# Patient Record
Sex: Female | Born: 1945 | Race: White | Hispanic: No | Marital: Married | Smoking: Never smoker
Health system: Southern US, Community
[De-identification: ages and names within clinical notes are randomized; demographics above are authoritative.]

## PROBLEM LIST (undated history)

## (undated) DIAGNOSIS — R0789 Other chest pain: Secondary | ICD-10-CM

## (undated) DIAGNOSIS — I10 Essential (primary) hypertension: Secondary | ICD-10-CM

## (undated) DIAGNOSIS — K589 Irritable bowel syndrome without diarrhea: Secondary | ICD-10-CM

## (undated) DIAGNOSIS — C439 Malignant melanoma of skin, unspecified: Secondary | ICD-10-CM

## (undated) DIAGNOSIS — R208 Other disturbances of skin sensation: Secondary | ICD-10-CM

## (undated) DIAGNOSIS — E538 Deficiency of other specified B group vitamins: Secondary | ICD-10-CM

## (undated) DIAGNOSIS — M47812 Spondylosis without myelopathy or radiculopathy, cervical region: Secondary | ICD-10-CM

## (undated) DIAGNOSIS — E7800 Pure hypercholesterolemia, unspecified: Secondary | ICD-10-CM

## (undated) DIAGNOSIS — F419 Anxiety disorder, unspecified: Secondary | ICD-10-CM

## (undated) DIAGNOSIS — K5730 Diverticulosis of large intestine without perforation or abscess without bleeding: Secondary | ICD-10-CM

## (undated) DIAGNOSIS — K219 Gastro-esophageal reflux disease without esophagitis: Secondary | ICD-10-CM

## (undated) DIAGNOSIS — H4310 Vitreous hemorrhage, unspecified eye: Secondary | ICD-10-CM

## (undated) HISTORY — DX: Irritable bowel syndrome, unspecified: K58.9

## (undated) HISTORY — DX: Diverticulosis of large intestine without perforation or abscess without bleeding: K57.30

## (undated) HISTORY — DX: Other disturbances of skin sensation: R20.8

## (undated) HISTORY — DX: Essential (primary) hypertension: I10

## (undated) HISTORY — DX: Vitreous hemorrhage, unspecified eye: H43.10

## (undated) HISTORY — DX: Other chest pain: R07.89

## (undated) HISTORY — DX: Deficiency of other specified B group vitamins: E53.8

## (undated) HISTORY — PX: OTHER SURGICAL HISTORY: SHX169

## (undated) HISTORY — DX: Malignant melanoma of skin, unspecified: C43.9

## (undated) HISTORY — DX: Gastro-esophageal reflux disease without esophagitis: K21.9

## (undated) HISTORY — DX: Anxiety disorder, unspecified: F41.9

## (undated) HISTORY — DX: Pure hypercholesterolemia, unspecified: E78.00

## (undated) HISTORY — DX: Spondylosis without myelopathy or radiculopathy, cervical region: M47.812

---

## 1993-02-27 HISTORY — PX: OTHER SURGICAL HISTORY: SHX169

## 1994-02-27 HISTORY — PX: OTHER SURGICAL HISTORY: SHX169

## 1997-06-25 ENCOUNTER — Ambulatory Visit (HOSPITAL_COMMUNITY): Admission: RE | Admit: 1997-06-25 | Discharge: 1997-06-25 | Payer: Self-pay | Admitting: Obstetrics & Gynecology

## 1997-11-11 ENCOUNTER — Other Ambulatory Visit: Admission: RE | Admit: 1997-11-11 | Discharge: 1997-11-11 | Payer: Self-pay | Admitting: Obstetrics & Gynecology

## 1998-04-08 ENCOUNTER — Ambulatory Visit (HOSPITAL_COMMUNITY): Admission: RE | Admit: 1998-04-08 | Discharge: 1998-04-08 | Payer: Self-pay | Admitting: Obstetrics & Gynecology

## 1998-04-08 ENCOUNTER — Encounter: Payer: Self-pay | Admitting: Obstetrics & Gynecology

## 1998-11-16 ENCOUNTER — Other Ambulatory Visit: Admission: RE | Admit: 1998-11-16 | Discharge: 1998-11-16 | Payer: Self-pay | Admitting: Obstetrics & Gynecology

## 1999-12-20 ENCOUNTER — Other Ambulatory Visit: Admission: RE | Admit: 1999-12-20 | Discharge: 1999-12-20 | Payer: Self-pay | Admitting: Obstetrics & Gynecology

## 2000-05-16 ENCOUNTER — Encounter: Payer: Self-pay | Admitting: Obstetrics & Gynecology

## 2000-05-16 ENCOUNTER — Encounter: Admission: RE | Admit: 2000-05-16 | Discharge: 2000-05-16 | Payer: Self-pay | Admitting: Obstetrics & Gynecology

## 2000-08-24 ENCOUNTER — Ambulatory Visit (HOSPITAL_BASED_OUTPATIENT_CLINIC_OR_DEPARTMENT_OTHER): Admission: RE | Admit: 2000-08-24 | Discharge: 2000-08-24 | Payer: Self-pay | Admitting: Plastic Surgery

## 2000-08-24 ENCOUNTER — Encounter (INDEPENDENT_AMBULATORY_CARE_PROVIDER_SITE_OTHER): Payer: Self-pay | Admitting: *Deleted

## 2000-09-24 ENCOUNTER — Ambulatory Visit (HOSPITAL_COMMUNITY): Admission: RE | Admit: 2000-09-24 | Discharge: 2000-09-24 | Payer: Self-pay | Admitting: Obstetrics & Gynecology

## 2000-09-24 ENCOUNTER — Encounter: Payer: Self-pay | Admitting: Obstetrics & Gynecology

## 2000-12-04 ENCOUNTER — Encounter: Payer: Self-pay | Admitting: Obstetrics & Gynecology

## 2000-12-04 ENCOUNTER — Encounter: Admission: RE | Admit: 2000-12-04 | Discharge: 2000-12-04 | Payer: Self-pay | Admitting: Obstetrics & Gynecology

## 2000-12-25 ENCOUNTER — Other Ambulatory Visit: Admission: RE | Admit: 2000-12-25 | Discharge: 2000-12-25 | Payer: Self-pay | Admitting: Obstetrics & Gynecology

## 2001-02-08 ENCOUNTER — Emergency Department (HOSPITAL_COMMUNITY): Admission: EM | Admit: 2001-02-08 | Discharge: 2001-02-08 | Payer: Self-pay | Admitting: Emergency Medicine

## 2001-02-08 ENCOUNTER — Encounter: Payer: Self-pay | Admitting: Emergency Medicine

## 2001-02-11 ENCOUNTER — Emergency Department (HOSPITAL_COMMUNITY): Admission: EM | Admit: 2001-02-11 | Discharge: 2001-02-11 | Payer: Self-pay | Admitting: Emergency Medicine

## 2001-02-11 ENCOUNTER — Encounter: Payer: Self-pay | Admitting: Emergency Medicine

## 2001-12-05 ENCOUNTER — Encounter: Admission: RE | Admit: 2001-12-05 | Discharge: 2001-12-05 | Payer: Self-pay | Admitting: Obstetrics & Gynecology

## 2001-12-05 ENCOUNTER — Encounter: Payer: Self-pay | Admitting: Obstetrics & Gynecology

## 2001-12-12 ENCOUNTER — Encounter: Payer: Self-pay | Admitting: Obstetrics & Gynecology

## 2001-12-12 ENCOUNTER — Ambulatory Visit (HOSPITAL_COMMUNITY): Admission: RE | Admit: 2001-12-12 | Discharge: 2001-12-12 | Payer: Self-pay | Admitting: Obstetrics & Gynecology

## 2001-12-25 ENCOUNTER — Other Ambulatory Visit: Admission: RE | Admit: 2001-12-25 | Discharge: 2001-12-25 | Payer: Self-pay | Admitting: Obstetrics & Gynecology

## 2002-01-22 ENCOUNTER — Ambulatory Visit (HOSPITAL_COMMUNITY): Admission: RE | Admit: 2002-01-22 | Discharge: 2002-01-22 | Payer: Self-pay | Admitting: Pulmonary Disease

## 2002-01-22 ENCOUNTER — Encounter: Payer: Self-pay | Admitting: Pulmonary Disease

## 2002-03-18 ENCOUNTER — Emergency Department (HOSPITAL_COMMUNITY): Admission: EM | Admit: 2002-03-18 | Discharge: 2002-03-19 | Payer: Self-pay | Admitting: Emergency Medicine

## 2002-03-27 ENCOUNTER — Encounter: Payer: Self-pay | Admitting: Pulmonary Disease

## 2002-03-27 ENCOUNTER — Ambulatory Visit (HOSPITAL_COMMUNITY): Admission: RE | Admit: 2002-03-27 | Discharge: 2002-03-27 | Payer: Self-pay | Admitting: Oncology

## 2002-04-04 ENCOUNTER — Encounter: Admission: RE | Admit: 2002-04-04 | Discharge: 2002-04-04 | Payer: Self-pay | Admitting: Neurosurgery

## 2002-04-04 ENCOUNTER — Encounter: Payer: Self-pay | Admitting: Neurosurgery

## 2002-04-22 ENCOUNTER — Encounter: Payer: Self-pay | Admitting: Gastroenterology

## 2002-04-22 ENCOUNTER — Ambulatory Visit (HOSPITAL_COMMUNITY): Admission: RE | Admit: 2002-04-22 | Discharge: 2002-04-22 | Payer: Self-pay | Admitting: Gastroenterology

## 2002-05-09 ENCOUNTER — Encounter: Payer: Self-pay | Admitting: Gastroenterology

## 2002-05-09 ENCOUNTER — Ambulatory Visit (HOSPITAL_COMMUNITY): Admission: RE | Admit: 2002-05-09 | Discharge: 2002-05-09 | Payer: Self-pay | Admitting: Gastroenterology

## 2003-01-02 ENCOUNTER — Other Ambulatory Visit: Admission: RE | Admit: 2003-01-02 | Discharge: 2003-01-02 | Payer: Self-pay | Admitting: Obstetrics & Gynecology

## 2004-01-08 ENCOUNTER — Ambulatory Visit: Payer: Self-pay | Admitting: Pulmonary Disease

## 2004-01-14 ENCOUNTER — Other Ambulatory Visit: Admission: RE | Admit: 2004-01-14 | Discharge: 2004-01-14 | Payer: Self-pay | Admitting: Obstetrics & Gynecology

## 2004-03-28 ENCOUNTER — Ambulatory Visit: Payer: Self-pay | Admitting: Pulmonary Disease

## 2004-10-07 ENCOUNTER — Ambulatory Visit: Payer: Self-pay | Admitting: Pulmonary Disease

## 2005-01-24 ENCOUNTER — Other Ambulatory Visit: Admission: RE | Admit: 2005-01-24 | Discharge: 2005-01-24 | Payer: Self-pay | Admitting: Obstetrics & Gynecology

## 2005-01-26 ENCOUNTER — Encounter: Admission: RE | Admit: 2005-01-26 | Discharge: 2005-01-26 | Payer: Self-pay | Admitting: Obstetrics & Gynecology

## 2005-10-09 ENCOUNTER — Ambulatory Visit: Payer: Self-pay | Admitting: Pulmonary Disease

## 2005-12-26 ENCOUNTER — Ambulatory Visit: Payer: Self-pay | Admitting: Family Medicine

## 2006-02-02 ENCOUNTER — Encounter: Admission: RE | Admit: 2006-02-02 | Discharge: 2006-02-02 | Payer: Self-pay | Admitting: Obstetrics & Gynecology

## 2006-06-18 ENCOUNTER — Ambulatory Visit: Payer: Self-pay | Admitting: Family Medicine

## 2006-11-27 ENCOUNTER — Ambulatory Visit: Payer: Self-pay | Admitting: Pulmonary Disease

## 2006-11-27 LAB — CONVERTED CEMR LAB
ALT: 20 units/L (ref 0–35)
Alkaline Phosphatase: 41 units/L (ref 39–117)
Basophils Absolute: 0 10*3/uL (ref 0.0–0.1)
Bilirubin, Direct: 0.1 mg/dL (ref 0.0–0.3)
Creatinine, Ser: 0.8 mg/dL (ref 0.4–1.2)
Eosinophils Absolute: 0.3 10*3/uL (ref 0.0–0.6)
Eosinophils Relative: 3.8 % (ref 0.0–5.0)
Glucose, Bld: 93 mg/dL (ref 70–99)
Hemoglobin: 13.3 g/dL (ref 12.0–15.0)
Ketones, ur: NEGATIVE mg/dL
MCHC: 34.7 g/dL (ref 30.0–36.0)
MCV: 88.7 fL (ref 78.0–100.0)
Monocytes Absolute: 0.5 10*3/uL (ref 0.2–0.7)
Neutrophils Relative %: 67.1 % (ref 43.0–77.0)
Nitrite: NEGATIVE
Platelets: 253 10*3/uL (ref 150–400)
RDW: 11.6 % (ref 11.5–14.6)
Specific Gravity, Urine: <=1.005 (ref 1.000–1.03)
TSH: 1.46 microintl units/mL (ref 0.35–5.50)
Total Bilirubin: 0.8 mg/dL (ref 0.3–1.2)
Total Protein, Urine: NEGATIVE mg/dL

## 2007-02-05 ENCOUNTER — Encounter: Admission: RE | Admit: 2007-02-05 | Discharge: 2007-02-05 | Payer: Self-pay | Admitting: Obstetrics & Gynecology

## 2007-05-28 ENCOUNTER — Ambulatory Visit: Payer: Self-pay | Admitting: Gastroenterology

## 2007-06-26 ENCOUNTER — Ambulatory Visit: Payer: Self-pay | Admitting: Gastroenterology

## 2007-06-26 ENCOUNTER — Encounter: Payer: Self-pay | Admitting: Pulmonary Disease

## 2008-01-10 DIAGNOSIS — K219 Gastro-esophageal reflux disease without esophagitis: Secondary | ICD-10-CM | POA: Insufficient documentation

## 2008-01-10 DIAGNOSIS — E7800 Pure hypercholesterolemia, unspecified: Secondary | ICD-10-CM

## 2008-01-13 ENCOUNTER — Ambulatory Visit: Payer: Self-pay | Admitting: Pulmonary Disease

## 2008-01-13 DIAGNOSIS — R0789 Other chest pain: Secondary | ICD-10-CM | POA: Insufficient documentation

## 2008-01-13 DIAGNOSIS — M47812 Spondylosis without myelopathy or radiculopathy, cervical region: Secondary | ICD-10-CM | POA: Insufficient documentation

## 2008-01-13 DIAGNOSIS — I10 Essential (primary) hypertension: Secondary | ICD-10-CM

## 2008-01-13 DIAGNOSIS — F411 Generalized anxiety disorder: Secondary | ICD-10-CM | POA: Insufficient documentation

## 2008-01-13 DIAGNOSIS — K5730 Diverticulosis of large intestine without perforation or abscess without bleeding: Secondary | ICD-10-CM | POA: Insufficient documentation

## 2008-01-13 DIAGNOSIS — K589 Irritable bowel syndrome without diarrhea: Secondary | ICD-10-CM | POA: Insufficient documentation

## 2008-01-13 DIAGNOSIS — H4310 Vitreous hemorrhage, unspecified eye: Secondary | ICD-10-CM | POA: Insufficient documentation

## 2008-01-13 DIAGNOSIS — C439 Malignant melanoma of skin, unspecified: Secondary | ICD-10-CM | POA: Insufficient documentation

## 2008-02-13 ENCOUNTER — Encounter: Admission: RE | Admit: 2008-02-13 | Discharge: 2008-02-13 | Payer: Self-pay | Admitting: Obstetrics & Gynecology

## 2008-05-23 DIAGNOSIS — R209 Unspecified disturbances of skin sensation: Secondary | ICD-10-CM | POA: Insufficient documentation

## 2008-09-18 ENCOUNTER — Telehealth (INDEPENDENT_AMBULATORY_CARE_PROVIDER_SITE_OTHER): Payer: Self-pay | Admitting: *Deleted

## 2009-03-12 ENCOUNTER — Ambulatory Visit: Payer: Self-pay | Admitting: Pulmonary Disease

## 2009-03-16 LAB — CONVERTED CEMR LAB
Basophils Relative: 0.6 % (ref 0.0–3.0)
Bilirubin, Direct: 0.2 mg/dL (ref 0.0–0.3)
CO2: 18 meq/L — ABNORMAL LOW (ref 19–32)
Calcium: 10 mg/dL (ref 8.4–10.5)
Chloride: 98 meq/L (ref 96–112)
Cholesterol: 210 mg/dL — ABNORMAL HIGH (ref 0–200)
Creatinine, Ser: 0.82 mg/dL (ref 0.40–1.20)
Eosinophils Absolute: 0.2 10*3/uL (ref 0.0–0.7)
Hemoglobin, Urine: NEGATIVE
Lymphocytes Relative: 27.1 % (ref 12.0–46.0)
Lymphs Abs: 1.5 10*3/uL (ref 0.7–4.0)
Monocytes Absolute: 0.5 10*3/uL (ref 0.1–1.0)
Monocytes Relative: 8.5 % (ref 3.0–12.0)
Neutrophils Relative %: 60.2 % (ref 43.0–77.0)
Platelets: 246 10*3/uL (ref 150.0–400.0)
Potassium: 4.4 meq/L (ref 3.5–5.3)
RDW: 11.9 % (ref 11.5–14.6)
Specific Gravity, Urine: <=1.005 (ref 1.000–1.030)
TSH: 1.55 microintl units/mL (ref 0.35–5.50)
Total Protein, Urine: NEGATIVE mg/dL
Triglycerides: 120 mg/dL (ref <150)
Urobilinogen, UA: 0.2 (ref 0.0–1.0)
VLDL: 24 mg/dL (ref 0–40)
Vitamin B-12: >1500 pg/mL — ABNORMAL HIGH (ref 211–911)
WBC: 5.4 10*3/uL (ref 4.5–10.5)

## 2009-03-17 ENCOUNTER — Telehealth (INDEPENDENT_AMBULATORY_CARE_PROVIDER_SITE_OTHER): Payer: Self-pay | Admitting: *Deleted

## 2009-04-28 ENCOUNTER — Encounter: Admission: RE | Admit: 2009-04-28 | Discharge: 2009-04-28 | Payer: Self-pay | Admitting: Obstetrics & Gynecology

## 2009-04-29 ENCOUNTER — Ambulatory Visit: Payer: Self-pay | Admitting: Pulmonary Disease

## 2009-05-12 ENCOUNTER — Telehealth (INDEPENDENT_AMBULATORY_CARE_PROVIDER_SITE_OTHER): Payer: Self-pay | Admitting: *Deleted

## 2009-06-01 ENCOUNTER — Ambulatory Visit: Payer: Self-pay | Admitting: Pulmonary Disease

## 2009-09-03 ENCOUNTER — Ambulatory Visit: Payer: Self-pay | Admitting: Internal Medicine

## 2009-09-03 DIAGNOSIS — S61209A Unspecified open wound of unspecified finger without damage to nail, initial encounter: Secondary | ICD-10-CM | POA: Insufficient documentation

## 2009-10-11 ENCOUNTER — Ambulatory Visit (HOSPITAL_BASED_OUTPATIENT_CLINIC_OR_DEPARTMENT_OTHER): Admission: RE | Admit: 2009-10-11 | Discharge: 2009-10-11 | Payer: Self-pay | Admitting: Pulmonary Disease

## 2009-10-11 ENCOUNTER — Ambulatory Visit: Payer: Self-pay | Admitting: Pulmonary Disease

## 2009-10-11 DIAGNOSIS — R069 Unspecified abnormalities of breathing: Secondary | ICD-10-CM | POA: Insufficient documentation

## 2009-10-11 DIAGNOSIS — R0609 Other forms of dyspnea: Secondary | ICD-10-CM

## 2009-10-11 DIAGNOSIS — R0989 Other specified symptoms and signs involving the circulatory and respiratory systems: Secondary | ICD-10-CM | POA: Insufficient documentation

## 2009-10-21 ENCOUNTER — Ambulatory Visit: Payer: Self-pay | Admitting: Pulmonary Disease

## 2009-10-27 ENCOUNTER — Encounter: Payer: Self-pay | Admitting: Pulmonary Disease

## 2009-11-19 ENCOUNTER — Ambulatory Visit: Payer: Self-pay | Admitting: Pulmonary Disease

## 2009-11-19 DIAGNOSIS — G4733 Obstructive sleep apnea (adult) (pediatric): Secondary | ICD-10-CM | POA: Insufficient documentation

## 2010-03-11 ENCOUNTER — Telehealth: Payer: Self-pay | Admitting: Pulmonary Disease

## 2010-03-16 ENCOUNTER — Encounter: Payer: Self-pay | Admitting: Pulmonary Disease

## 2010-03-16 ENCOUNTER — Ambulatory Visit
Admission: RE | Admit: 2010-03-16 | Discharge: 2010-03-16 | Payer: Self-pay | Source: Home / Self Care | Attending: Pulmonary Disease | Admitting: Pulmonary Disease

## 2010-03-16 ENCOUNTER — Other Ambulatory Visit: Payer: Self-pay | Admitting: Pulmonary Disease

## 2010-03-16 LAB — LIPID PANEL
Cholesterol: 224 mg/dL — ABNORMAL HIGH (ref 0–200)
HDL: 59.4 mg/dL (ref >39.00)
Total CHOL/HDL Ratio: 4
Triglycerides: 99 mg/dL (ref 0.0–149.0)
VLDL: 19.8 mg/dL (ref 0.0–40.0)

## 2010-03-16 LAB — CBC WITH DIFFERENTIAL/PLATELET
Basophils Absolute: 0 10*3/uL (ref 0.0–0.1)
Basophils Relative: 0.3 % (ref 0.0–3.0)
Eosinophils Absolute: 0.2 10*3/uL (ref 0.0–0.7)
Eosinophils Relative: 3.8 % (ref 0.0–5.0)
HCT: 37.2 % (ref 36.0–46.0)
Hemoglobin: 12.9 g/dL (ref 12.0–15.0)
Lymphocytes Relative: 28.1 % (ref 12.0–46.0)
Lymphs Abs: 1.4 10*3/uL (ref 0.7–4.0)
MCHC: 34.6 g/dL (ref 30.0–36.0)
MCV: 88.4 fl (ref 78.0–100.0)
Monocytes Absolute: 0.4 10*3/uL (ref 0.1–1.0)
Monocytes Relative: 8 % (ref 3.0–12.0)
Neutro Abs: 2.9 10*3/uL (ref 1.4–7.7)
Neutrophils Relative %: 59.8 % (ref 43.0–77.0)
Platelets: 250 10*3/uL (ref 150.0–400.0)
RBC: 4.21 Mil/uL (ref 3.87–5.11)
RDW: 12.9 % (ref 11.5–14.6)
WBC: 4.9 10*3/uL (ref 4.5–10.5)

## 2010-03-16 LAB — HEPATIC FUNCTION PANEL
ALT: 23 U/L (ref 0–35)
AST: 27 U/L (ref 0–37)
Albumin: 4.1 g/dL (ref 3.5–5.2)
Alkaline Phosphatase: 47 U/L (ref 39–117)
Bilirubin, Direct: 0.2 mg/dL (ref 0.0–0.3)
Total Bilirubin: 1 mg/dL (ref 0.3–1.2)
Total Protein: 6.8 g/dL (ref 6.0–8.3)

## 2010-03-16 LAB — URINALYSIS, ROUTINE W REFLEX MICROSCOPIC
Bilirubin Urine: NEGATIVE
Hemoglobin, Urine: NEGATIVE
Ketones, ur: NEGATIVE
Nitrite: NEGATIVE
Specific Gravity, Urine: 1.01 (ref 1.000–1.030)
Total Protein, Urine: NEGATIVE
Urine Glucose: NEGATIVE
Urobilinogen, UA: 0.2 (ref 0.0–1.0)
pH: 7.5 (ref 5.0–8.0)

## 2010-03-16 LAB — BASIC METABOLIC PANEL
BUN: 12 mg/dL (ref 6–23)
CO2: 28 mEq/L (ref 19–32)
Calcium: 9.9 mg/dL (ref 8.4–10.5)
Chloride: 97 mEq/L (ref 96–112)
Creatinine, Ser: 0.9 mg/dL (ref 0.4–1.2)
GFR: 71.41 mL/min (ref >60.00)
Glucose, Bld: 91 mg/dL (ref 70–99)
Potassium: 5.3 mEq/L — ABNORMAL HIGH (ref 3.5–5.1)
Sodium: 133 mEq/L — ABNORMAL LOW (ref 135–145)

## 2010-03-16 LAB — TSH: TSH: 1.83 u[IU]/mL (ref 0.35–5.50)

## 2010-03-16 LAB — LDL CHOLESTEROL, DIRECT: Direct LDL: 144.8 mg/dL

## 2010-03-16 LAB — VITAMIN B12: Vitamin B-12: 1100 pg/mL — ABNORMAL HIGH (ref 211–911)

## 2010-03-19 ENCOUNTER — Encounter: Payer: Self-pay | Admitting: Obstetrics & Gynecology

## 2010-03-21 ENCOUNTER — Encounter: Payer: Self-pay | Admitting: Pulmonary Disease

## 2010-03-21 ENCOUNTER — Ambulatory Visit
Admission: RE | Admit: 2010-03-21 | Discharge: 2010-03-21 | Payer: Self-pay | Source: Home / Self Care | Attending: Pulmonary Disease | Admitting: Pulmonary Disease

## 2010-03-22 ENCOUNTER — Other Ambulatory Visit: Payer: Self-pay | Admitting: Obstetrics & Gynecology

## 2010-03-22 DIAGNOSIS — Z1239 Encounter for other screening for malignant neoplasm of breast: Secondary | ICD-10-CM

## 2010-03-25 ENCOUNTER — Other Ambulatory Visit: Payer: Self-pay | Admitting: Dermatology

## 2010-03-27 LAB — CONVERTED CEMR LAB
ALT: 21 units/L (ref 0–35)
BUN: 9 mg/dL (ref 6–23)
Basophils Absolute: 0 10*3/uL (ref 0.0–0.1)
Bilirubin Urine: NEGATIVE
Bilirubin, Direct: 0.1 mg/dL (ref 0.0–0.3)
CO2: 30 meq/L (ref 19–32)
Calcium: 9.6 mg/dL (ref 8.4–10.5)
Cholesterol: 257 mg/dL (ref 0–200)
Creatinine, Ser: 0.8 mg/dL (ref 0.4–1.2)
Crystals: NEGATIVE
Direct LDL: 156.9 mg/dL
GFR calc non Af Amer: 77 mL/min
Glucose, Bld: 91 mg/dL (ref 70–99)
HCT: 37.8 % (ref 36.0–46.0)
HDL: 51.3 mg/dL (ref >39.0)
Hemoglobin, Urine: NEGATIVE
Ketones, ur: NEGATIVE mg/dL
Leukocytes, UA: NEGATIVE
Lymphocytes Relative: 31.2 % (ref 12.0–46.0)
Monocytes Absolute: 0.5 10*3/uL (ref 0.1–1.0)
Mucus, UA: NEGATIVE
Nitrite: NEGATIVE
RBC: 4.21 M/uL (ref 3.87–5.11)
TSH: 1.51 microintl units/mL (ref 0.35–5.50)
Total CHOL/HDL Ratio: 5
Total Protein: 7 g/dL (ref 6.0–8.3)
Urine Glucose: NEGATIVE mg/dL
Urobilinogen, UA: 0.2 (ref 0.0–1.0)
VLDL: 24 mg/dL (ref 0–40)
WBC: 4.9 10*3/uL (ref 4.5–10.5)
pH: 6.5 (ref 5.0–8.0)

## 2010-03-31 NOTE — Assessment & Plan Note (Signed)
Summary: 4 weeks/apc   CC:  4 wk follow up on BP.  Pt states BP is "doing ok on some days and not on others."  states she is checking BP everyday but forgot to bring readings.  States BP is ranging from 120/80-150/90.  Pt states BP is usually normal in the mornings but increases at night..  History of Present Illness: 65  yo with known hx of HTN,    ~  Nov09:  her CC is muscle discomfort c/w fibromyalgia that she treats w/ massage and acupuncture... she sees DrTorelli for preventive cardiology, and DrPatterson for GI- one aunt & cousin w/ colon cancer & dx w/ Lynch syndrome- hx GERD & IBS- s/p EGD (+GERD, reflux esoph) & colonoscopy (neg) 4/09...   ~  March 12, 2009:  she had a good yr overall- reports that DrTorelli added TEKTURNA 150mg /d for BP but it was too strong so she decr to 1/4th tab daily... she had [facility]cho from [facility] showing mild LVH w/ sl incr LVwall thicknesss & EF=67%... she is concerned because DrT has left [place] & she isn't sure what she is going to do...   April 29, 2009--Presents for work in visit. Pt c/o "stress in the mouth" x 1 month, pt states has had dental work done. Pt c/o elevated BP readings x last few days with readings 170-180 over 90s to 102. Pt c/o left side sinus pain associated with jaw pain from dental crown work. She is taking Advil 400mg  three times a day for teeth pain.  Pt has taken a whole tablet of Tekturna last 2 days. She has more dental work today.    June 01, 2009 --Returns for 4 wk follow up on BP.  Pt states BP is "doing ok on some days and not on others."  states she is checking BP everyday but forgot to bring readings.  States BP is ranging from 120/80-150/90.  Pt states BP is usually normal in the mornings but increases at night. She is starting to feel better from dental work, has had to use some advil. Denies chest pain, dyspnea, orthopnea, hemoptysis, fever, n/v/d, edema, headache,visual /speech changes.     Current Medications  (verified): 1)  Aspirin Adult Low Strength 81 Mg Tbec (Aspirin) .... Take 1 Tablet By Mouth Once A Day 2)  Toprol Xl 25 Mg Xr24h-Tab (Metoprolol Succinate) .... Take 1 Tablet By Mouth Once A Day 3)  Maxzide-25 37.5-25 Mg Tabs (Triamterene-Hctz) .... Take 1 Tablet By Mouth Once A Day 4)  Tekturna 150 Mg Tabs (Aliskiren Fumarate) .... Take 1 Tablet By Mouth Once A Day 5)  Fish Oil 1000 Mg Caps (Omega-3 Fatty Acids) .... Take 1 Tablet By Mouth Once A Day 6)  Red Yeast Rice 600 Mg Tabs (Red Yeast Rice Extract) .... Take 1 Tablet By Mouth Once A Day 7)  Vitamin C 500 Mg Chew (Ascorbic Acid) .... Take 1 Tablet By Mouth Once A Day 8)  Alprazolam 0.5 Mg  Tabs (Alprazolam) .... Take 1 Tab By Mouth At Bedtime 9)  Advil 200 Mg Tabs (Ibuprofen) .... As Needed 10)  B-100  Tabs (Vitamins-Lipotropics) .... Once Daily 11)  Coenzyme Q10 200 Mg Caps (Coenzyme Q10) .... Once Daily  Allergies (verified): 1)  ! Codeine 2)  ! Prinivil (Lisinopril) 3)  ! 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 1 sibling alive age 49  Social History: Last updated: 01/13/2008 never smoked exposed to  second hand smoke exercises 4 times per week no caffeine use married 2 children  Risk Factors: Smoking Status: never (01/13/2008)  Review of Systems      See HPI  Vital Signs:  Patient profile:   64 year old female Height:      60 inches Weight:      133 pounds BMI:     26.07 O2 Sat:      99 % on Room air Temp:     97.5 degrees F oral Pulse rate:   68 / minute BP sitting:   124 / 82  (left arm) Cuff size:   regular  Vitals Entered By: Gweneth Dimitri RN (June 01, 2009 9:46 AM)  O2 Flow:  Room air CC: 4 wk follow up on BP.  Pt states BP is "doing ok on some days and not on others."  states she is checking BP everyday but forgot to bring readings.  States BP is ranging from 120/80-150/90.  Pt states BP is usually normal in the mornings but increases at night. Is Patient Diabetic? No Comments Medications reviewed with patient Daytime contact number verified with patient. Gweneth Dimitri RN  June 01, 2009 9:47 AM    Physical Exam  Additional Exam:  WD, WN, 65 y/o WF in NAD... GENERAL:  Alert & oriented; pleasant & cooperative... HEENT:  [place]/AT, EOM-wnl, PERRLA, Fundi-benign, EACs-clear, TMs-wnl, NOSE-clear, THROAT-clear & wnl. NECK:  Supple w/ fairROM; no JVD; normal carotid impulses w/o bruits; no thyromegaly or nodules palpated; no lymphadenopathy. Scars of ant cerv discectomy & melanoma surgery... CHEST:  Clear to P & A; without wheezes/ rales/ or rhonchi. HEART:  Regular Rhythm; without murmurs/ rubs/ or gallops. ABDOMEN:  Soft & nontender; normal bowel sounds; no organomegaly or masses detected. EXT: without deformities or arthritic changes; no varicose veins/ venous insuffic/ or edema. NEURO:  CN's intact; motor testing normal; sensory testing normal; gait normal & balance OK. DERM:  scar ant neck region, along chin w/ bandage from recent mole removal.      Impression & Recommendations:  Problem # 1:  HYPERTENSION (ICD-401.9)  b/p improving. Would change toprol  to at bedtime to see if this helps w/ improved 24hr coverage along w/ tekturna and maxzide.  low salt diet.  follow up 3 mon. Dr. Kriste Basque  Her updated medication list for this problem includes:    Toprol Xl 25 Mg Xr24h-tab (Metoprolol succinate) .Marland Kitchen... 1 by mouth at bedtime    Maxzide-25 37.5-25 Mg Tabs (Triamterene-hctz) .Marland Kitchen... Take 1 tablet by mouth once a day    Tekturna 150 Mg Tabs (Aliskiren fumarate) .Marland Kitchen... Take 1 tablet by mouth once a day  Orders: Est. Patient Level II (78295)  Medications Added to Medication List This Visit: 1)  Aspirin Adult Low Strength 81 Mg Tbec (Aspirin) .Marland Kitchen.. 1 by mouth at bedtime 2)  Toprol Xl 25 Mg Xr24h-tab (Metoprolol  succinate) .Marland Kitchen.. 1 by mouth at bedtime 3)  Tekturna 150 Mg Tabs (Aliskiren fumarate) .... Take 1 tablet by mouth once a day 4)  Coenzyme Q10 200 Mg Caps (Coenzyme q10) .... Once daily 5)  B-100 Tabs (Vitamins-lipotropics) .... Once daily 6)  Advil 200 Mg Tabs (Ibuprofen) .... As needed  Complete Medication List: 1)  Aspirin Adult Low Strength 81 Mg Tbec (Aspirin) .Marland Kitchen.. 1 by mouth at bedtime 2)  Toprol Xl 25 Mg Xr24h-tab (Metoprolol succinate) .Marland Kitchen.. 1 by mouth at bedtime 3)  Maxzide-25 37.5-25 Mg Tabs (Triamterene-hctz) .... Take 1 tablet by mouth once a day 4)  Tekturna 150 Mg Tabs (Aliskiren fumarate) .... Take 1 tablet by mouth once a day 5)  Fish Oil 1000 Mg Caps (Omega-3 fatty acids) .... Take 1 tablet by mouth once a day 6)  Red Yeast Rice 600 Mg Tabs (Red yeast rice extract) .... Take 1 tablet by mouth once a day 7)  Vitamin C 500 Mg Chew (Ascorbic acid) .... Take 1 tablet by mouth once a day 8)  Alprazolam 0.5 Mg Tabs (Alprazolam) .... Take 1 tab by mouth at bedtime 9)  Coenzyme Q10 200 Mg Caps (Coenzyme q10) .... Once daily 10)  B-100 Tabs (Vitamins-lipotropics) .... Once daily 11)  Advil 200 Mg Tabs (Ibuprofen) .... As needed  Patient Instructions: 1)  Continue on same meds.  2)  Low salt diet 3)  Limit Advil (NSAID) use if  possible 4)  Check blood pressure daily in am 3x/week  5)  follow up 2-3 months Dr. Kriste Basque  6)  Please contact office for sooner follow up if symptoms do not improve or worsen

## 2010-03-31 NOTE — Assessment & Plan Note (Signed)
Summary: Acute NP office visit - right finger infection   CC:  Cut on right middle finger x1week that began swelling and redness and pain x2-3days.  has been treating with polysporin.  History of Present Illness: 65  yo with known hx of HTN,    ~  March 12, 2009:  she had a good yr overall- reports that DrTorelli added TEKTURNA 150mg /d for BP but it was too strong so she decr to 1/4th tab daily... she had 2DEcho from [facility] showing mild LVH w/ sl incr LVwall thicknesss & EF=67%... she is concerned because DrT has left [place] & she isn't sure what she is going to do...   April 29, 2009--Presents for work in visit. Pt c/o "stress in the mouth" x 1 month, pt states has had dental work done. Pt c/o elevated BP readings x last few days with readings 170-180 over 90s to 102. Pt c/o left side sinus pain associated with jaw pain from dental crown work. She is taking Advil 400mg  three times a day for teeth pain.  Pt has taken a whole tablet of Tekturna last 2 days. She has more dental work today.    June 01, 2009 --Returns for 4 wk follow up on BP.  Pt states BP is "doing ok on some days and not on others."  states she is checking BP everyday but forgot to bring readings.  States BP is ranging from 120/80-150/90.  Pt states BP is usually normal in the mornings but increases at night. She is starting to feel better from dental work, has had to use some advil. Denies chest pain, dyspnea, orthopnea, hemoptysis, fever, n/v/d, edema, headache,visual /speech changes.    September 03, 2009 -Presents for a work in visit. Says she cut on right middle finger x1week that began swelling, redness and pain x2-3days.  has been treating with polysporin. does not remember how she did this either w/ paper cut or on knife cutting vegetables. Noticed along the cut it puffy and tender. No significanct redness, swelling, pain or drainage. Started to cover and put abx ointment on yesterday. .Denies chest pain, dyspnea, orthopnea,  hemoptysis, fever, n/v/d, edema, headach  Medications Prior to Update: 1)  Aspirin Adult Low Strength 81 Mg Tbec (Aspirin) .Marland Kitchen.. 1 By Mouth At Bedtime 2)  Toprol Xl 25 Mg Xr24h-Tab (Metoprolol Succinate) .Marland Kitchen.. 1 By Mouth At Bedtime 3)  Maxzide-25 37.5-25 Mg Tabs (Triamterene-Hctz) .... Take 1 Tablet By Mouth Once A Day 4)  Tekturna 150 Mg Tabs (Aliskiren Fumarate) .... Take 1 Tablet By Mouth Once A Day 5)  Fish Oil 1000 Mg Caps (Omega-3 Fatty Acids) .... Take 1 Tablet By Mouth Once A Day 6)  Red Yeast Rice 600 Mg Tabs (Red Yeast Rice Extract) .... Take 1 Tablet By Mouth Once A Day 7)  Vitamin C 500 Mg Chew (Ascorbic Acid) .... Take 1 Tablet By Mouth Once A Day 8)  Alprazolam 0.5 Mg  Tabs (Alprazolam) .... Take 1 Tab By Mouth At Bedtime 9)  Coenzyme Q10 200 Mg Caps (Coenzyme Q10) .... Once Daily 10)  B-100  Tabs (Vitamins-Lipotropics) .... Once Daily 11)  Advil 200 Mg Tabs (Ibuprofen) .... As Needed  Current Medications (verified): 1)  Aspirin Adult Low Strength 81 Mg Tbec (Aspirin) .Marland Kitchen.. 1 By Mouth At Bedtime 2)  Toprol Xl 25 Mg Xr24h-Tab (Metoprolol Succinate) .Marland Kitchen.. 1 By Mouth At Bedtime 3)  Maxzide-25 37.5-25 Mg Tabs (Triamterene-Hctz) .... Take 1 Tablet By Mouth Once A Day 4)  Tekturna 150 Mg Tabs (Aliskiren Fumarate) .... Take 1/2 Tablet By Mouth Once A Day 5)  Fish Oil 1000 Mg Caps (Omega-3 Fatty Acids) .... Take 2 Capsules By Mouth Once A Day 6)  Red Yeast Rice 600 Mg Tabs (Red Yeast Rice Extract) .... Take 1 Tablet By Mouth Once A Day 7)  Alprazolam 0.5 Mg  Tabs (Alprazolam) .... Take 1 Tab By Mouth At Bedtime 8)  Coenzyme Q10 200 Mg Caps (Coenzyme Q10) .... Once Daily 9)  B-100  Tabs (Vitamins-Lipotropics) .... Once Daily 10)  Advil 200 Mg Tabs (Ibuprofen) .... As Needed 11)  Vitamin D 2000 Unit Tabs (Cholecalciferol) .... Take 1 Tablet By Mouth Once A Day 12)  Magnesium Glycinate Plus 110 Mg Caps (Magnesium) .... Take 1 Capsule By Mouth Once A Day 13)  Multivitamins   Tabs (Multiple  Vitamin) .... Take 1 Tablet By Mouth Once A Day  Allergies: 1)  ! Codeine 2)  ! Prinivil (Lisinopril) 3)  ! 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 1 sibling alive age 62  Social History: Last updated: 01/13/2008 never smoked exposed to second hand smoke exercises 4 times per week no caffeine use married 2 children  Risk Factors: Smoking Status: never (01/13/2008)  Review of Systems      See HPI  Vital Signs:  Patient profile:   65 year old female Height:      60 inches Weight:      129 pounds BMI:     25.28 O2 Sat:      98 % on Room air Temp:     97.2 degrees F oral Pulse rate:   69 / minute BP sitting:   116 / 84  (right arm) Cuff size:   regular  Vitals Entered By: Boone Master CNA/MA (September 03, 2009 12:01 PM)  O2 Flow:  Room air CC: Cut on right middle finger x1week that began swelling, redness and pain x2-3days.  has been treating with polysporin Is Patient Diabetic? No Comments Medications reviewed with patient Daytime contact number verified with patient. Boone Master CNA/MA  September 03, 2009 12:01 PM    Physical Exam  Additional  Exam:  WD, WN, 65 y/o WF in NAD... GENERAL:  Alert & oriented; pleasant & cooperative... HEENT:  [place]/AT, EACs-clear, TMs-wnl, NOSE-clear, THROAT-clear & wnl. NECK:  Supple w/ fairROM; no JVD; normal carotid impulses w/o bruits; no thyromegaly or nodules palpated; no lymphadenopathy. Scars of ant cerv discectomy & melanoma surgery... CHEST:  Clear to P & A; without wheezes/ rales/ or rhonchi. HEART:  Regular Rhythm; without murmurs/ rubs/ or gallops. ABDOMEN:  Soft & nontender; normal bowel sounds; no organomegaly or masses detected. EXT: without deformities or arthritic changes; no varicose veins/ venous insuffic/ or edema. Derm: along right middle finger, small 0.5cm laceration, nearly approvimated w/ localized swelling, pink, no  significant redness, no drainage. good rom, no streaking is noted.      Impression & Recommendations:  Problem # 1:  LACERATION OF FINGER (ICD-883.0)  Right middle finger laceration, appears to be healing w/ localized inflammation.  advised on clearing and covering w/ bandage.  clean and bandage until healed, if not improving or redness develops given rx for keflex x 7days advised is does not resolve w/ ill need to return for evaluation.  Please contact office for sooner follow up if symptoms do not improve or worsen  TDAP TODAY  Orders: Est. Patient Level III (16109)  Medications Added to Medication List This Visit: 1)  Tekturna 150 Mg Tabs (Aliskiren fumarate) .... Take 1/2 tablet by mouth once a day 2)  Fish Oil 1000 Mg Caps (Omega-3 fatty acids) .... Take 2 capsules by mouth once a day 3)  Vitamin D 2000 Unit Tabs (Cholecalciferol) .... Take 1 tablet by mouth once a day 4)  Magnesium Glycinate Plus 110 Mg Caps (Magnesium) .... Take 1 capsule by mouth once a day 5)  Multivitamins Tabs (Multiple vitamin) .... Take 1 tablet by mouth once a day 6)  Keflex 500 Mg Caps (Cephalexin) .Marland Kitchen.. 1 by mouth four times a day  Complete Medication List: 1)  Aspirin  Adult Low Strength 81 Mg Tbec (Aspirin) .Marland Kitchen.. 1 by mouth at bedtime 2)  Toprol Xl 25 Mg Xr24h-tab (Metoprolol succinate) .Marland Kitchen.. 1 by mouth at bedtime 3)  Maxzide-25 37.5-25 Mg Tabs (Triamterene-hctz) .... Take 1 tablet by mouth once a day 4)  Tekturna 150 Mg Tabs (Aliskiren fumarate) .... Take 1/2 tablet by mouth once a day 5)  Fish Oil 1000 Mg Caps (Omega-3 fatty acids) .... Take 2 capsules by mouth once a day 6)  Red Yeast Rice 600 Mg Tabs (Red yeast rice extract) .... Take 1 tablet by mouth once a day 7)  Alprazolam 0.5 Mg Tabs (Alprazolam) .... Take 1 tab by mouth at bedtime 8)  Coenzyme Q10 200 Mg Caps (Coenzyme q10) .... Once daily 9)  B-100 Tabs (Vitamins-lipotropics) .... Once daily 10)  Advil 200 Mg Tabs (Ibuprofen) .... As needed 11)  Vitamin D 2000 Unit Tabs (Cholecalciferol) .... Take 1 tablet by mouth once a day 12)  Magnesium Glycinate Plus 110 Mg Caps (Magnesium) .... Take 1 capsule by mouth once a day 13)  Multivitamins Tabs (Multiple vitamin) .... Take 1 tablet by mouth once a day 14)  Keflex 500 Mg Caps (Cephalexin) .Marland Kitchen.. 1 by mouth four times a day  Other Orders: Tdap => 56yrs IM (60454) Admin 1st Vaccine (09811)  Patient Instructions: 1)  Wash finger two times a day w/ soap and water, pat dry , apply neosporin two times a day and cover w/ bandaid until healed.  2)  Keflex 500mg  four times a day for 7 days 3)  Watch for increased swelling, redness or fever, call our office back if this occurs.  4)  Please contact office for sooner follow up if symptoms do not improve or worsen  5)  TDAP booster today.  Prescriptions: KEFLEX 500 MG CAPS (CEPHALEXIN) 1 by mouth four times a day  #28 x 0   Entered and Authorized by:   Rubye Oaks NP   Signed by:   Raif Chachere NP on 09/03/2009   Method used:   Print then Give to Patient   RxID:   [id number]    Immunizations Administered:  Tetanus Vaccine:    Vaccine Type: Tdap    Site: right deltoid  Mfr: [employer]     Dose: 0.5 ml    Route: IM    Given by: Boone Master CNA/MA    Exp. Date: 05/21/2011    Lot #: [id number]    VIS given: 01/15/07 version given September 03, 2009.

## 2010-03-31 NOTE — Assessment & Plan Note (Signed)
Summary: rov/ 2-3 months/ mss   CC:  7 month ROV & review....  History of Present Illness: 65 y/o WF here for a follow up visit and CPX... she has multiple medical problems as noted below...    ~  Nov09:  her CC is muscle discomfort c/w fibromyalgia that she treats w/ massage and acupuncture... she sees DrTorelli for preventive cardiology, and DrPatterson for GI- one aunt & cousin w/ colon cancer & dx w/ Lynch syndrome- hx GERD & IBS- s/p EGD (+GERD, reflux esoph) & colonoscopy (neg) 4/09...   ~  March 12, 2009:  she had a good yr overall- reports that DrTorelli added TEKTURNA 150mg /d for BP but it was too strong so she decr to 1/4th tab daily... she had 2DEcho from [facility] showing mild LVH w/ sl incr LVwall thicknesss & EF=67%... she is concerned because DrT has left [place] & she isn't sure what she is going to do...    ~  October 11, 2009:  [place] the [place]09 & had w/u in ER at [facility]: CTBrain= neg; CT CSpine= s/p surg, NAD.Marland Kitchen. 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 still getting accupuncture & massage therapy but need DrNudelman to review & we will set this up...  NOTE: she reports that husb c/o her snoring & we discussed sleep study...     Current Problem List:  PHYSICAL EXAMINATION (ICD-V70.0) -   ~  GYN= prev DrWein for PAP, etc...  ~  Mammogram at [facility] & up to date according to pt...  ~  BMD at [facility] and up to date according to pt...  on Caltrate Bid, MVI, Vit D...  ~  colonoscopy done 4/09 by DrPatterson and neg- f/u planned 5 yrs...  ~  Immunizations:  she had the 2010Flu vaccine 10/10.  Hx of VITREOUS HEMORRHAGE (ICD-379.23) - eval and followed at [facility]... doing well w/o recurrence.  HYPERTENSION (ICD-401.9) - on TOPROL XL 25mg /d,  MAXZIDE-25 daily, & TEKTURNA 150mg - taking 1/2 tab/d... followed by DrTorelli w/ good control per pt hx & she states  that BPs are good at home too... BP here today= 122/66 & denies HA, fatigue, visual changes, CP, palipit, dizziness, syncope, dyspnea, edema, etc...  Hx of CHEST PAIN, ATYPICAL (ICD-786.59) - on ASA 81mg /d... hx atypical CP w/ eval DrTorelli including 2DEcho which was OK according to the pt and a neg GXT... she had seen DrBerry, Walker Kehr, DrNasher in the past...  HYPERCHOLESTEROLEMIA (ICD-272.0) - Eval and Rx from DrTorrelli in [place]- on FISH OIL 1000mg /d + Red Yeast Rice & diet...  ~  FLP 6/08 by DrTorelli showed TChol 187, TG 97, HDL 48, LDL 120  ~  FLP 11/09 showed TChol 257, TG 119, HDL 51, LDL 157... pt refused med Rx, copy to DrTorelli.  ~  FLP 6/10 by DrT showed TChol 214, TG 154, HDL 58, LDL 125  ~  FLP 1/11 here showed TChol 210, TG 120, HDL 55, LDL 131  GERD (ICD-530.81) - off prev Aciphex Rx & now takes "digestive enzymes" which she states helps...  ~  EGD 4/09 by DrPatterson showed mild esophagitis only... rec> PPI Rx.  DIVERTICULOSIS OF COLON (ICD-562.10) & IRRITABLE BOWEL SYNDROME (ICD-564.1) - prev on numerous herbal preps including "extended digestion enzymes"... prev evals by [facility]s, [facility]...  ~  Colonoscopy 4/09 by DrPatterson showed long redundant colon, no lesions (w/o divertics  or polyps).  ~  1/11:  c/o hemorroid & requests cream= PROCTOSOL HC Prn use...  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 she gets massage therapy and acupuncture at the [facility] here in [place] (prev from the [facility] in [place])... Rx Tramadol vs Advil.  ~  she fell while at the beach 5/11 w/ CT Brain & CSpine at [facility]- neg, NAD... f/u eval by Ortho, DrDraper w/ MRI CSpine 6/11= mild sp stenosis & foraminal stenosis C3-4, C7-T1... Rx w/ PT but symptoms persist> f/u DrNudelman.  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 normal MRI Brain and symptoms lessened w/ Alpraz Rx...  ANXIETY (ICD-300.00) - on ALPRAZOLAM 0.25mg  Tid Prn... she has hx of anxiety and insomnia- Alprazolam helps...  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 she also had a skin cancer removed from her nose, and sees Derm every 6 months (DrLLomax)...   Preventive Screening-Counseling & Management  Alcohol-Tobacco     Smoking Status: never  Allergies: 1)  ! Codeine 2)  ! Prinivil (Lisinopril) 3)  ! Pravachol (Pravastatin Sodium)  Comments:  Nurse/Medical Assistant: The patient's medications and allergies were reviewed with the patient and were updated in the Medication and Allergy Lists.  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 mother alive age 37---hx of DM father alive age 72 1 sibling alive age 70  hx of prostate cancer and hyperchol. 1 sibling alive age 20  Social History: Reviewed history from 01/13/2008 and no changes required. never smoked exposed to second hand smoke exercises 4 times per week no caffeine use married 2 children  Review of Systems      See HPI  The patient denies anorexia,  fever, weight loss, weight gain, vision loss, decreased hearing, hoarseness, chest pain, syncope, dyspnea on exertion, peripheral edema, prolonged cough, headaches, hemoptysis, abdominal pain, melena, hematochezia, severe indigestion/heartburn, hematuria, incontinence, muscle weakness, suspicious skin lesions, transient blindness, difficulty walking, depression, unusual weight change, abnormal bleeding, enlarged lymph nodes, and angioedema.    Vital Signs:  Patient profile:   65 year old female Height:      60 inches Weight:      129.38 pounds BMI:     25.36 O2 Sat:      100 % on Room air Temp:     98.2 degrees F oral Pulse rate:   73 / minute BP sitting:   122 / 66  (right arm) Cuff size:   regular  Vitals Entered By: Randell Loop CMA (October 11, 2009 10:14 AM)  O2 Sat at Rest %:  100 O2 Flow:  Room air CC: 7 month ROV & review... Is Patient Diabetic? No Pain Assessment Patient in pain? yes  Comments meds updated today with pt   Physical Exam  Additional Exam:  WD, WN, 65 y/o WF in NAD... GENERAL:  Alert & oriented; pleasant & cooperative... HEENT:  [place]/AT, EOM-wnl, PERRLA, Fundi-benign, EACs-clear, TMs-wnl, NOSE-clear, THROAT-clear & wnl. NECK:  Supple w/ fairROM; no JVD; normal carotid impulses w/o bruits; no thyromegaly or nodules palpated; no lymphadenopathy. Scars of ant cerv discectomy & melanoma surgery... CHEST:  Clear to P & A; without wheezes/ rales/ or rhonchi. HEART:  Regular Rhythm; without murmurs/ rubs/ or gallops. ABDOMEN:  Soft & nontender; normal bowel sounds; no organomegaly or masses detected. EXT: without deformities or arthritic changes; no varicose veins/ venous insuffic/ or edema. NEURO:  CN's intact; motor testing normal; sensory testing normal; gait normal & balance OK. DERM:  scar ant neck region, no lesions noted; no rash etc...    MISC. Report  Procedure date:  10/11/2009  Findings:      DATA REVIEWED:  ~  EMR notes...  ~  ER note &  CT scans from 07/05/09 [facility]...  ~  MRI CSpine done 08/05/09 by [facility]...   Impression & Recommendations:  Problem # 1:  NECK PAIN>>> Hx Cerv myelopathy w/ CSpine surg 1996 by DrNudelman... new neck symptoms since fall 5/11 & we will arrange for f/u DrNudelman...  Problem # 2:  SNORING (ICD-786.09) R/O OSA w/ sleep study at her request... Her updated medication list for this problem includes:    Toprol Xl 25 Mg Xr24h-tab (Metoprolol succinate) .Marland Kitchen... 1 by mouth at bedtime    Maxzide-25 37.5-25 Mg Tabs (Triamterene-hctz) .Marland Kitchen... Take 1 tablet by mouth once a day  Orders: Diagnostic Polysomnogram (Diagnostic Polysomno)  Problem # 3:  HYPERTENSION (ICD-401.9) Controlled> same meds. Her updated medication list for this problem includes:    Toprol Xl 25 Mg Xr24h-tab (Metoprolol succinate) .Marland Kitchen... 1 by mouth at bedtime    Maxzide-25 37.5-25 Mg Tabs (Triamterene-hctz) .Marland Kitchen... Take 1 tablet by mouth once a day    Tekturna 150 Mg Tabs (Aliskiren fumarate) .Marland Kitchen... Take 1/2 tablet by mouth once a day  Problem # 4:  HYPERCHOLESTEROLEMIA (ICD-272.0) Stable on non-statin Rx per DrTorrelli...  Problem # 5:  IRRITABLE BOWEL SYNDROME (ICD-564.1) GI is stable w/ prev evals DrNat-Mann, [facility], & [facility]!  Problem # 6:  ANXIETY (ICD-300.00) Continue same med... Her updated medication list for this problem includes:    Alprazolam 0.5 Mg Tabs (Alprazolam) .Marland Kitchen... Take 1 tab by mouth at bedtime  Complete Medication List: 1)  Aspirin Adult Low Strength 81 Mg Tbec (Aspirin) .Marland Kitchen.. 1 by mouth at bedtime 2)  Toprol Xl 25 Mg Xr24h-tab (Metoprolol succinate) .Marland Kitchen.. 1 by mouth at bedtime 3)  Maxzide-25 37.5-25 Mg Tabs (Triamterene-hctz) .... Take 1 tablet by mouth once a day 4)  Tekturna 150 Mg Tabs (Aliskiren fumarate) .... Take 1/2 tablet by mouth once a day 5)  Fish Oil 1000 Mg Caps (Omega-3 fatty acids) .... Take 2 capsules by mouth once a day 6)  Red Yeast Rice 600 Mg Tabs (Red yeast rice extract)  .... 2 tabs by mouth once daily 7)  Coenzyme Q10 200 Mg Caps (Coenzyme q10) .... Once daily 8)  Advil 200 Mg Tabs (Ibuprofen) .... As needed 9)  Alprazolam 0.5 Mg Tabs (Alprazolam) .... Take 1 tab by mouth at bedtime 10)  Multivitamins Tabs (Multiple vitamin) .... Take 1 tablet by mouth once a day 11)  B-100 Tabs (Vitamins-lipotropics) .... Once daily 12)  Vitamin D 2000 Unit Tabs (Cholecalciferol) .... Take 1 tablet  by mouth once a day 13)  Zinc Magnesium Aspartate 150-3.83-10 Mg Caps (Zinc-magnesium aspart-vit b6) .... Take 1 tablet by mouth once a day  Other Orders: Neurosurgeon Referral (Neurosurgeon)  Patient Instructions: 1)  Today we updated your med list- see below.... 2)  Continue your current meds the same... 3)  We will arrange for a sleep study & notify you of the results when avail.Marland KitchenMarland Kitchen 4)  We will also set up an appt w/ DrNudelman to check yopur neck.Marland KitchenMarland Kitchen 5)  Call for any questions.Marland KitchenMarland Kitchen

## 2010-03-31 NOTE — Progress Notes (Signed)
Summary: labs prior to cpx  Phone Note Call from Patient   Caller: Patient Call For: dr Kriste Basque Summary of Call: Patient has an appointment for her CPX 1/23 and wants to know if she can have her labs drawn prior to that day so the results will be back for her appointment. Patient can be reached at [phone] Initial call taken by: Vedia Coffer,  March 11, 2010 1:59 PM  Follow-up for Phone Call        will check with SN to see what labs to enter for pt to come in early for labs Randell Loop CMA  March 11, 2010 2:09 PM   Additional Follow-up for Phone Call Additional follow up Details #1::        called and spoke with pt and she is aware that they can come anytime next week for fasting labs.  lab orders are in the computer Randell Loop CMA  March 11, 2010 2:39 PM

## 2010-03-31 NOTE — Progress Notes (Signed)
Summary: lab results  Phone Note Call from Patient Call back at Home Phone [phone]   Caller: Patient Call For: nadel Summary of Call: pt wants recent labs results mailed to her home address.  Initial call taken by: Tivis Ringer,  March 17, 2009 9:46 AM  Follow-up for Phone Call        lab results mailed to pt.  Pt aware.  Gweneth Dimitri RN  March 17, 2009 9:55 AM
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
## 2010-03-31 NOTE — Progress Notes (Signed)
Summary: high bp  Phone Note Call from Patient   Caller: Patient Call For: tammy parrett Summary of Call: wants to speak to tp re: bp. pt's bp (taken 10 mins ago) is 160 / 90. she saw tp 04/29/09 for same problem. pt says she is taking advil. says the high bp is caused by the pain she has been having since dental surgery. call home # until 2:30 then cell [phone] Initial call taken by: Tivis Ringer, CNA,  May 12, 2009 2:00 PM  Follow-up for Phone Call        Spoke with pt.  She was seen by TP on 3/3 for increased BP.  She was told to increase tekturna 150 mg from 1/2 to 1 tab daily.  She states that she has been taking her BP in the am and it has not been over 140/80.  She states that she checked this afternoon and it was 160/90.  She relates this to increased pain from dental procedure.  She is only taking advil for pain.  Please advise recs thanks  Follow-up by: Vernie Murders,  May 12, 2009 2:09 PM  Additional Follow-up for Phone Call Additional follow up Details #1::        per SN---pain will definiately raise the BP and she doesnt need more BP meds just less pain or more pain meds.  SN rec continue to monitor BP at home and call if BP conts. to be elevated.  does she need pain meds?  thanks Randell Loop CMA  May 12, 2009 5:07 PM     Additional Follow-up for Phone Call Additional follow up Details #2::    Spoke with pt and advised of recs per SN.  Pt verbalized understanding.  She states that she feels that pain seems to be improving and her BP is as well.  She just had GYN appt and her BP was normal.  She will keep record of readings and call for any problems. Follow-up by: Vernie Murders,  May 12, 2009 5:14 PM

## 2010-03-31 NOTE — Assessment & Plan Note (Signed)
Summary: HIGH BP ///kp   Visit Type:  Sick visit  CC:  Pt c/o "stress in the mouth" x 1 month and pt states has had dental work done. Pt c/o elevated BP readings x last few days with readings 170-180 over 90s to 102. Pt c/o left side sinus pain associated with jaw pain from dental crown work.  History of Present Illness: 65  yo with known hx of HTN,    ~  Nov09:  her CC is muscle discomfort c/w fibromyalgia that she treats w/ massage and acupuncture... she sees DrTorelli for preventive cardiology, and DrPatterson for GI- one aunt & cousin w/ colon cancer & dx w/ Lynch syndrome- hx GERD & IBS- s/p EGD (+GERD, reflux esoph) & colonoscopy (neg) 4/09...   ~  March 12, 2009:  she had a good yr overall- reports that DrTorelli added TEKTURNA 150mg /d for BP but it was too strong so she decr to 1/4th tab daily... she had 2[facility]cho from [facility] showing mild LVH w/ sl incr LVwall thicknesss & EF=67%... she is concerned because DrT has left [place] & she isn't sure what she is going to do...   April 29, 2009--Presents for work in visit. Pt c/o "stress in the mouth" x 1 month, pt states has had dental work done. Pt c/o elevated BP readings x last few days with readings 170-180 over 90s to 102. Pt c/o left side sinus pain associated with jaw pain from dental crown work. She is taking Advil 400mg  three times a day for teeth pain.  Pt has taken a whole tablet of Tekturna last 2 days. She has more dental work today. Denies chest pain, dyspnea, orthopnea, hemoptysis, fever, n/v/d, edema, headache,recent travel or antibiotics, visual/speech changes or extremity weakness.      Current Medications (verified): 1)  Aspirin Adult Low Strength 81 Mg Tbec (Aspirin) .... Take 1 Tablet By Mouth Once A Day 2)  Toprol Xl 25 Mg Xr24h-Tab (Metoprolol Succinate) .... Take 1 Tablet By Mouth Once A Day 3)  Maxzide-25 37.5-25 Mg Tabs (Triamterene-Hctz) .... Take 1 Tablet By Mouth Once A Day 4)  Tekturna 150 Mg Tabs (Aliskiren  Fumarate) .... Take 1/2 Tablet By Mouth Once A Day 5)  Fish Oil 1000 Mg Caps (Omega-3 Fatty Acids) .... Take 1 Tablet By Mouth Once A Day 6)  Red Yeast Rice 600 Mg Tabs (Red Yeast Rice Extract) .... Take 1 Tablet By Mouth Once A Day 7)  Vitamin C 500 Mg Chew (Ascorbic Acid) .... Take 1 Tablet By Mouth Once A Day 8)  Alprazolam 0.5 Mg  Tabs (Alprazolam) .... Take 1 Tab By Mouth At Bedtime 9)  Advil 200 Mg Tabs (Ibuprofen) .... Take 2 Tablet By Mouth Three Times A Day 10)  Tylenol Extra Strength 500 Mg Tabs (Acetaminophen) .... As Needed  Allergies (verified): 1)  ! Codeine 2)  ! Prinivil (Lisinopril) 3)  ! 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 1 sibling alive age 45  Social History: Last updated: 01/13/2008 never smoked exposed to second hand smoke exercises 4 times per week no caffeine use married 2 children  Review of Systems      See HPI  Vital Signs:  Patient profile:   65 year old female Height:      60 inches Weight:      135 pounds O2 Sat:      100 % on Room air Temp:     98.1 degrees F oral Pulse rate:   66 / minute BP sitting:   136 / 88   (left arm) Cuff size:   regular  Vitals Entered By: Zackery Barefoot CMA (April 29, 2009 9:40 AM)  O2 Flow:  Room air CC: Pt c/o "stress in the mouth" x 1 month, pt states has had dental work done. Pt c/o elevated BP readings x last few days with readings 170-180 over 90s to 102. Pt c/o left side sinus pain associated with jaw pain from dental crown work Comments Medications reviewed with patient Verified contact number and pharmacy with patient Zackery Barefoot CMA  April 29, 2009 9:47 AM    Physical Exam  Additional Exam:  WD, WN, 65 y/o WF in NAD... GENERAL:  Alert & oriented; pleasant & cooperative... HEENT:  [place]/AT, EOM-wnl, PERRLA, Fundi-benign, EACs-clear, TMs-wnl, NOSE-clear, THROAT-clear & wnl. NECK:  Supple w/ fairROM; no JVD; normal carotid impulses w/o bruits; no thyromegaly or nodules palpated; no lymphadenopathy. Scars of ant cerv discectomy & melanoma surgery... CHEST:  Clear to P & A; without wheezes/ rales/ or rhonchi. HEART:  Regular Rhythm; without murmurs/ rubs/ or gallops. ABDOMEN:  Soft & nontender; normal bowel sounds; no organomegaly or masses detected. EXT: without deformities or arthritic changes; no varicose veins/ venous insuffic/ or edema. NEURO:  CN's intact; motor testing normal; sensory testing normal; gait normal & balance OK. DERM:  scar ant neck region, no lesions noted; no rash etc...     Impression & Recommendations:  Problem # 1:  HYPERTENSION (ICD-401.9)  Elevated blood pressure suspect 2/2 to NSAID use, and dental pain REC:  Increase Tekturna 150mg  1 by mouth once daily  Low salt diet Limit Advil (NSAID) use if possible Check blood pressure daily in am at rest , keep log .  follow up 4 week.  Please contact office for sooner follow up if symptoms do not improve or worsen  Her updated medication list for this problem includes:    Toprol Xl 25 Mg Xr24h-tab (Metoprolol succinate) .Marland Kitchen... Take 1 tablet by mouth once a day    Maxzide-25  37.5-25 Mg Tabs (Triamterene-hctz) .Marland Kitchen... Take 1 tablet by mouth once a day    Tekturna 150 Mg Tabs (Aliskiren fumarate) .Marland Kitchen... Take 1/2 tablet by mouth once a day  BP today: 136/88 Prior BP: 128/80 (03/12/2009)  Labs Reviewed: K+: 4.4 (03/12/2009) Creat: : 0.82 (03/12/2009)   Chol: 210 (03/12/2009)   HDL: 55 (03/12/2009)   LDL: 131 (03/12/2009)   TG: 120 (03/12/2009)  Orders: Est. Patient Level IV (16109)  Medications Added to Medication List This Visit: 1)  Tekturna 150 Mg Tabs (Aliskiren fumarate) .... Take 1/2 tablet by mouth once a day 2)  Alprazolam 0.5 Mg Tabs (Alprazolam) .... Take 1 tab by mouth at bedtime 3)  Advil 200 Mg Tabs (Ibuprofen) .... Take 2 tablet by mouth three times a day 4)  Tylenol Extra Strength 500 Mg Tabs (Acetaminophen) .... As needed  Complete Medication List: 1)  Aspirin Adult Low Strength 81 Mg Tbec (Aspirin) .... Take 1 tablet by mouth once a day 2)  Toprol Xl 25 Mg Xr24h-tab (Metoprolol succinate) .... Take 1 tablet by mouth once a day 3)  Maxzide-25 37.5-25 Mg Tabs (Triamterene-hctz) .... Take 1 tablet by mouth once a day 4)  Tekturna 150 Mg Tabs (Aliskiren fumarate) .... Take 1/2 tablet by mouth once a day 5)  Fish Oil 1000 Mg Caps (Omega-3 fatty acids) .... Take 1 tablet by mouth once a day 6)  Red Yeast Rice 600 Mg Tabs (Red yeast rice extract) .... Take 1 tablet by mouth once a day 7)  Vitamin C 500 Mg Chew (Ascorbic acid) .... Take 1 tablet by mouth once a day 8)  Alprazolam 0.5 Mg Tabs (Alprazolam) .... Take 1 tab by mouth at bedtime 9)  Advil 200 Mg Tabs (Ibuprofen) .... Take 2 tablet by mouth three times a day 10)  Tylenol Extra Strength 500 Mg Tabs (Acetaminophen) .... As needed  Patient Instructions: 1)  Increase Tekturna 150mg  1 by mouth once daily  2)  Low salt diet 3)  Limit Advil (NSAID) use if possible 4)  Check blood pressure daily in am at rest , keep log .  5)  follow up 4 week.  6)  Please contact office for sooner follow up if  symptoms do not improve or worsen    Immunization History:  Influenza Immunization History:    Influenza:  historical (11/30/2008)

## 2010-03-31 NOTE — Letter (Signed)
Summary: [facility]  [facility]   Imported By: Lennie Odor 11/24/2009 14:39:18  _____________________________________________________________________  External Attachment:    Type:   Image     Comment:   External Document

## 2010-03-31 NOTE — Assessment & Plan Note (Signed)
Summary: cpx/fasting/apc   CC:  14 month ROV & CPX....  History of Present Illness: 65 y/o WF here for a follow up visit and CPX... she has multiple medical problems as noted below...    ~  Nov09:  her CC is muscle discomfort c/w fibromyalgia that she treats w/ massage and acupuncture... she sees DrTorelli for preventive cardiology, and DrPatterson for GI- one aunt & cousin w/ colon cancer & dx w/ Lynch syndrome- hx GERD & IBS- s/p EGD (+GERD, reflux esoph) & colonoscopy (neg) 4/09...   ~  March 12, 2009:  she had a good yr overall- reports that DrTorelli added TEKTURNA 150mg /d for BP but it was too strong so she decr to 1/4th tab daily... she had 2DEcho from [facility] showing mild LVH w/ sl incr LVwall thicknesss & EF=67%... she is concerned because DrT has left [place] & she isn't sure what she is going to do...     Current Problem List:  PHYSICAL EXAMINATION (ICD-V70.0) -   ~  GYN= prev DrWein for PAP, etc...  ~  Mammogram at [facility] & up to date according to pt...  ~  BMD at [facility] and up to date according to pt...  on Caltrate Bid, MVI, Vit D...  ~  colonoscopy done 4/09 by DrPatterson and neg- f/u planned 5 yrs...  ~  Immunizations:  she had the 2010Flu vaccine 10/10.  Hx of VITREOUS HEMORRHAGE (ICD-379.23) - eval and followed at [facility]... doing well w/o recurrence.  HYPERTENSION (ICD-401.9) - on TOPROL XL 25mg /d,  MAXZIDE-25 daily, & TEKTURNA 150mg - taking 1/4 tab/d... followed by DrTorelli w/ good control per pt hx & she states that BPs are good at home too... BP here today= 128/80 & denies HA, fatigue, visual changes, CP, palipit, dizziness, syncope, dyspnea, edema, etc...  Hx of CHEST PAIN, ATYPICAL (ICD-786.59) - on ASA 81mg /d... hx atypical CP w/ eval DrTorelli including 2DEcho which was OK according to the pt and a neg GXT... she had seen DrBerry, Walker Kehr, DrNasher in the past...  HYPERCHOLESTEROLEMIA (ICD-272.0) - Eval and Rx from DrTorrelli in [place]- on FISH OIL 1000mg /d +  Red Yeast Rice...  ~  FLP 6/08 by DrTorelli showed TChol 187, TG 97, HDL 48, LDL 120  ~  FLP 11/09 showed TChol 257, TG 119, HDL 51, LDL 157... pt refused med Rx, copy to DrTorelli.  ~  FLP 6/10 by DrT showed TChol 214, TG 154, HDL 58, LDL 125  ~  FLP 1/11 here showed TChol 210, TG 120, HDL 55, LDL 131  GERD (ICD-530.81) - off prev Aciphex Rx & now takes "digestive enzymes" which she states helps...  ~  EGD 4/09 by DrPatterson showed mild esophagitis only... rec> PPI Rx.  DIVERTICULOSIS OF COLON (ICD-562.10) & IRRITABLE BOWEL SYNDROME (ICD-564.1) - prev on numerous herbal preps including "extended digestion enzymes"... prev evals by [facility]...  ~  Colonoscopy 4/09 by DrPatterson showed long redundant colon, no lesions (w/o divertics or polyps).  ~  1/11:  c/o hemorroid & requests cream= PROCTOSOL HC Prn use...  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 she gets massage therapy and acupuncture at the [facility] here in [place] (prev from the [facility] in [place])...  Hx of DYSESTHESIA (ICD-782.0) - hx recurrent left facial dysesthesias in the 1990's w/ eval from DrSteifel et al (she also saw Rheum, ENT & Oral surgeon- w/ referral to [facility] oral surg dept and eventual rec for "  [employer]" stress reduction program)... normal MRI Brain and symptoms lessened w/ Alpraz Rx...  ANXIETY (ICD-300.00) - on ALPRAZOLAM 0.25mg  Tid Prn... she has hx of anxiety and insomnia- Alprazolam helps...  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 she also had a skin cancer removed from her nose, and sees Derm every 6 months (DrLLomax)...    Allergies: 1)  ! Codeine 2)  ! Prinivil (Lisinopril) 3)  ! Pravachol (Pravastatin Sodium)  Comments:  Nurse/Medical Assistant: The patient's medications and allergies were reviewed with the patient and were updated in the Medication and Allergy  Lists.  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 mother alive age 83---hx of DM father alive age 11 1 sibling alive age 76  hx of prostate cancer and hyperchol. 1 sibling alive age 44  Social History: Reviewed history from 01/13/2008 and no changes required. never smoked exposed to second hand smoke exercises 4 times per week no caffeine use married 2 children  Review of Systems       The patient complains of fatigue, change in bowel habits, gas/bloating, and stiffness.  The patient denies fever, chills, sweats, anorexia, weakness, malaise, weight loss, sleep disorder, blurring, diplopia, eye irritation, eye discharge, vision loss, eye pain, photophobia, earache, ear discharge, tinnitus, decreased hearing, nasal congestion, nosebleeds, sore throat, hoarseness, chest pain, palpitations, syncope, dyspnea on exertion, orthopnea, PND, peripheral edema, cough, dyspnea at rest, excessive sputum, hemoptysis, wheezing, pleurisy, nausea, vomiting, diarrhea, constipation, abdominal pain, melena, hematochezia, jaundice, indigestion/heartburn, dysphagia, odynophagia, dysuria, hematuria, urinary frequency, urinary hesitancy, nocturia, incontinence, back pain, joint pain, joint swelling, muscle cramps, muscle weakness, arthritis, sciatica,  restless legs, leg pain at night, leg pain with exertion, rash, itching, dryness, suspicious lesions, paralysis, paresthesias, seizures, tremors, vertigo, transient blindness, frequent falls, frequent headaches, difficulty walking, depression, anxiety, memory loss, confusion, cold intolerance, heat intolerance, polydipsia, polyphagia, polyuria, unusual weight change, abnormal bruising, bleeding, enlarged lymph nodes, urticaria, allergic rash, hay fever, and recurrent infections.    Vital Signs:  Patient profile:   65 year old female Height:      60 inches Weight:      134.13 pounds BMI:     26.29 O2 Sat:      97 % on Room air Temp:     97.1 degrees F oral Pulse rate:   64 / minute BP sitting:   128 / 80  (right arm) Cuff size:   regular  Vitals Entered By: Randell Loop CMA (March 12, 2009 9:04 AM)  O2 Sat at Rest %:  97 O2 Flow:  Room air CC: 14 month ROV & CPX... Is Patient Diabetic? No Pain Assessment Patient in pain? no      Comments meds updated today   Physical Exam  Additional Exam:  WD, WN, 65 y/o WF in NAD... GENERAL:  Alert & oriented; pleasant & cooperative... HEENT:  [place]/AT, EOM-wnl, PERRLA, Fundi-benign, EACs-clear, TMs-wnl, NOSE-clear, THROAT-clear & wnl. NECK:  Supple w/ fairROM; no JVD; normal carotid impulses w/o  bruits; no thyromegaly or nodules palpated; no lymphadenopathy. Scars of ant cerv discectomy & melanoma surgery... CHEST:  Clear to P & A; without wheezes/ rales/ or rhonchi. HEART:  Regular Rhythm; without murmurs/ rubs/ or gallops. ABDOMEN:  Soft & nontender; normal bowel sounds; no organomegaly or masses detected. EXT: without deformities or arthritic changes; no varicose veins/ venous insuffic/ or edema. NEURO:  CN's intact; motor testing normal; sensory testing normal; gait normal & balance OK. DERM:  scar ant neck region, no lesions noted; no rash etc...     CXR  Procedure date:  03/12/2009  Findings:      CHEST - 2 VIEW    Comparison: 01/13/2008 and 11/27/2006.   Findings: The heart size and mediastinal contours are stable. Heart size is accentuated by a mild pectus deformity.  The lungs are clear.  There is no pleural effusion.  Lower cervical fusion has been performed.   IMPRESSION: Stable examination.  No active cardiopulmonary process.   Read By:  Gerrianne Scale,  M.D.      EKG  Procedure date:  03/12/2009  Findings:      Sinus bradycardia with rate of:  58/ min... Tracing is WNL, NAD...  SN   MISC. Report  Procedure date:  03/12/2009  Findings:      Basic Metabolic Panel (16109)   Sodium                    137 mEq/L                   135-145   Potassium                 4.4 mEq/L                   3.5-5.3   Chloride                  98 mEq/L                    96-112   CO2                  [L]  18 mEq/L                    19-32   Glucose                   81 mg/dL                    60-45   BUN                       10 mg/dL                    4-09   Creatinine                0.82 mg/dL                  0.40-1.20   Calcium                   10.0 mg/dL                  8.1-19.1  Lipid Profile (47829)   Cholesterol          [H]  210 mg/dL  0-200   Triglyceride              120 mg/dL                   <784   HDL Cholesterol           55 mg/dL                    >69  LDL Cholesterol (Calc)                        [H]  131 mg/dL                   6-29  Liver Profile (22960)   Bilirubin, Total          0.5 mg/dL                   5.2-8.4   Bilirubin, Direct         0.2 mg/dL                   1.3-2.4   Indirect Bilirubin        0.3 mg/dL                   4.0-1.0   Alkaline Phosphatase      45 U/L                      39-117   AST/SGOT                  24 U/L                      0-37   ALT/SGPT                  18 U/L                      0-35   Total Protein             7.7 g/dL                    2.7-2.5   Albumin                   4.8 g/dL                     3.6-6.4   Vitamin D (25-Hydroxy)                             44 ng/mL                    30-89  B12 Serum - Total ONLY (B12)   Vitamin B12          [H]  >1500 pg/mL                 211-911  TSH (TSH)   FastTSH                   1.55 uIU/mL                 0.35-5.50  Comments:      CBC Platelet w/Diff (CBCD)   White Cell Count          5.4 K/uL  4.5-10.5   Red Cell Count            3.94 Mil/uL                 3.87-5.11   Hemoglobin           [L]  11.7 g/dL                   91.4-78.2   Hematocrit           [L]  35.7 %                      36.0-46.0   MCV                       90.6 fl                     78.0-100.0   Platelet Count            246.0 K/uL                  150.0-400.0   Neutrophil %              60.2 %                      43.0-77.0   Lymphocyte %              27.1 %                      12.0-46.0   Monocyte %                8.5 %                       3.0-12.0   Eosinophils%              3.6 %                       0.0-5.0   Basophils %               0.6 %                       0.0-3.0  UDip w/Micro (URINE)   Color                     YELLOW   Clarity                   CLEAR                       Clear   Specific Gravity          <=1.005                     1.000 - 1.030   Urine Ph                  7.0                         5.0-8.0   Protein                   NEGATIVE  Negative   Urine Glucose             NEGATIVE                    Negative   Ketones                   NEGATIVE                    Negative   Urine Bilirubin           NEGATIVE                    Negative   Blood                     NEGATIVE                    Negative   Urobilinogen              0.2                         0.0 - 1.0   Leukocyte Esterace        SMALL   Nitrite                   NEGATIVE                    Negative   Urine WBC                 0-2/hpf                     0-2/hpf   Urine Epith               Rare(0-4/hpf)               Rare(0-4/hpf)    Renal Epithelial          Few(5-10/hpf)               None                   Impression & Recommendations:  Problem # 1:  PHYSICAL EXAMINATION (ICD-V70.0)  Orders: EKG w/ Interpretation (93000) T-2 View CXR (71020TC) Venipuncture (16109) TLB-TSH (Thyroid Stimulating Hormone) (84443-TSH) TLB-CBC Platelet - w/Differential (85025-CBCD) TLB-Udip w/ Micro (81001-URINE) TLB-B12, Serum-Total ONLY (60454-U98) T-Vitamin D (25-Hydroxy) (11914-78295) T- * Misc. Laboratory test [phone])  Problem # 2:  HYPERTENSION (ICD-401.9) Controlled on meds-  I rentinuing the low dose Tekturna as well... Her updated medication list for this problem includes:    Toprol Xl 25 Mg Xr24h-tab (Metoprolol succinate) .Marland Kitchen... Take 1 tablet by mouth once a day    Maxzide-25 37.5-25 Mg Tabs (Triamterene-hctz) .Marland Kitchen... Take 1 tablet by mouth once a day    Tekturna 150 Mg Tabs (Aliskiren fumarate) .Marland Kitchen... Take as directed...  Problem # 3:  Hx of CHEST PAIN, ATYPICAL (ICD-786.59) She has been followed by DrTorelli, who has now left [place]- she will decide.  Problem # 4:  HYPERCHOLESTEROLEMIA (ICD-272.0) On diet + FishOil + RYR etc... she has been counselled by DrTorelli...  Problem # 5:  GERD (ICD-530.81) We discussed using the OTC Prilosec Prn... she is content to continue the digestive enzymes...  Problem # 6:  DIVERTICULOSIS OF COLON (ICD-562.10) She requests cream for Hem's- Protosol HC ream written...  Problem # 7:  CERVICAL SPONDYLOSIS  WITHOUT MYELOPATHY (ICD-721.0) Stable w/ acuppuncture from "[facility]"...  Problem # 8:  ANXIETY (ICD-300.00) She uses the Alpraz to help sleep... Her updated medication list for this problem includes:    Alprazolam 0.5 Mg Tabs (Alprazolam) .Marland Kitchen... Take 1 tab three times a day as needed  Problem # 9:  Hx of MALIGNANT MELANOMA (ICD-172.9)  Complete Medication List: 1)  Aspirin Adult Low Strength 81 Mg Tbec (Aspirin) .... Take 1 tablet by mouth once a day 2)  Toprol Xl 25 Mg  Xr24h-tab (Metoprolol succinate) .... Take 1 tablet by mouth once a day 3)  Maxzide-25 37.5-25 Mg Tabs (Triamterene-hctz) .... Take 1 tablet by mouth once a day 4)  Tekturna 150 Mg Tabs (Aliskiren fumarate) .... Take as directed... 5)  Fish Oil 1000 Mg Caps (Omega-3 fatty acids) .... Take 1 tablet by mouth once a day 6)  Red Yeast Rice 600 Mg Tabs (Red yeast rice extract) .... Take 1 tablet by mouth once a day 7)  Calcium 600 1500 Mg Tabs (Calcium carbonate) .... Take 1 tablet by mouth once a day 8)  Vitamin C 500 Mg Chew (Ascorbic acid) .... Take 1 tablet by mouth once a day 9)  Alprazolam 0.5 Mg Tabs (Alprazolam) .... Take 1 tab three times a day as needed 10)  Proctosol Hc 2.5 % Crea (Hydrocortisone) .... Apply as directed...  Other Orders: Prescription Created Electronically [phone])  Patient Instructions: 1)  Today we updated your med list- see below.... 2)  We refilled your meds for 2011... 3)  Today we did your follow up CXR, EKG, & FASTING blood work... please call the "phone tree" in a few days for your lab results.Marland KitchenMarland Kitchen 4)  Keep up the good work w/ your diet + exercise program... 5)  Call for any problems.Marland KitchenMarland Kitchen 6)  Please schedule a follow-up appointment in 1 year, sooner as needed. Prescriptions: PROCTOSOL HC 2.5 % CREA (HYDROCORTISONE) apply as directed...  #1 tube x prn   Entered and Authorized by:   Michele Mcalpine MD   Signed by:   Michele Mcalpine MD on 03/12/2009   Method used:   Print then Give to Patient   RxID:   [id number] ALPRAZOLAM 0.5 MG  TABS (ALPRAZOLAM) take 1 tab three times a day as needed  #90 x prn   Entered and Authorized by:   Michele Mcalpine MD   Signed by:   Michele Mcalpine MD on 03/12/2009   Method used:   Print then Give to Patient   RxID:   [id number] 37.5-25 MG TABS (TRIAMTERENE-HCTZ) Take 1 tablet by mouth once a day  #90 x prn   Entered and Authorized by:   Michele Mcalpine MD   Signed by:   Michele Mcalpine MD on 03/12/2009   Method used:    Print then Give to Patient   RxID:   [id number] TOPROL XL 25 MG XR24H-TAB (METOPROLOL SUCCINATE) Take 1 tablet by mouth once a day  #90 x prn   Entered and Authorized by:   Michele Mcalpine MD   Signed by:   Michele Mcalpine MD on 03/12/2009   Method used:   Print then Give to Patient   RxID:   [id number] TEKTURNA 150 MG TABS (ALISKIREN FUMARATE) take as directed...  #90 x prn   Entered and Authorized by:   Michele Mcalpine MD   Signed by:   Michele Mcalpine MD on 03/12/2009   Method used:   Print then  Give to Patient   RxID:   [id number]    CardioPerfect ECG  ID: [id number] Patient: DIM, MEISINGER DOB: 06-Jan-1946 Age: 65 Years Old Sex: Female Race: White Physician: Giulia Hickey,s Technician: Randell Loop CMA Height: 60 Weight: 134.13 Status: Unconfirmed Past Medical History:  Hx of VITREOUS HEMORRHAGE (ICD-379.23) HYPERTENSION (ICD-401.9) Hx of CHEST PAIN, ATYPICAL (ICD-786.59) HYPERCHOLESTEROLEMIA (ICD-272.0) GERD (ICD-530.81) DIVERTICULOSIS OF COLON (ICD-562.10) IRRITABLE BOWEL SYNDROME (ICD-564.1) 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 Tracing is WNL, NAD...  SN

## 2010-03-31 NOTE — Assessment & Plan Note (Signed)
Summary: consult for osa   Visit Type:  Initial Consult Copy to:  Alroy Dust  Primary Provider/Referring Provider:  Alroy Dust MD  CC:  sleep consult.Marland Kitchen  History of Present Illness: The pt is a 65 y/o female who I have been asked to see for management of osa.  She has undergone a sleep study last month, where she was found to have mild osa.  She has an AHI of 12/hr, and desat as low as 72% very transiently.  The pt has a h/o loud snoring, but her bedpartner has not noticed pauses in breathing or choking arousals.  She denies any kicking during the night while sleeping.  She goes to bed around 10:30pm, and arises at 7am to start her day.  She is unrested about 50% of the time.  She denies any significant daytime sleepiness with periods of inactivity, but will feel sleep pressure with reading for a long period of time in the evening.  She does not have alertness or concentration issues during the day.  She can watch a movie without falling asleep, and denies any issue with sleepiness while driving.  Her weight has been neutral over the last few years.  Current Medications (verified): 1)  Aspirin Adult Low Strength 81 Mg Tbec (Aspirin) .Marland Kitchen.. 1 By Mouth At Bedtime 2)  Toprol Xl 25 Mg Xr24h-Tab (Metoprolol Succinate) .Marland Kitchen.. 1 By Mouth At Bedtime 3)  Maxzide-25 37.5-25 Mg Tabs (Triamterene-Hctz) .... Take 1 Tablet By Mouth Once A Day 4)  Tekturna 150 Mg Tabs (Aliskiren Fumarate) .... Take 1/2 Tablet By Mouth Once A Day 5)  Fish Oil 1000 Mg Caps (Omega-3 Fatty Acids) .... Take 2 Capsules By Mouth Once A Day 6)  Red Yeast Rice 600 Mg Tabs (Red Yeast Rice Extract) .... 2 Tabs By Mouth Once Daily 7)  Coenzyme Q10 200 Mg Caps (Coenzyme Q10) .... Once Daily 8)  Advil 200 Mg Tabs (Ibuprofen) .... As Needed 9)  Alprazolam 0.5 Mg  Tabs (Alprazolam) .... Take 1 Tab By Mouth At Bedtime 10)  Multivitamins   Tabs (Multiple Vitamin) .... Take 1 Tablet By Mouth Once A Day 11)  B-100  Tabs (Vitamins-Lipotropics) ....  Once Daily 12)  Vitamin D 2000 Unit Tabs (Cholecalciferol) .... Take 1 Tablet By Mouth Once A Day 13)  Zinc Magnesium Aspartate 150-3.83-10 Mg Caps (Zinc-Magnesium Aspart-Vit B6) .... Take 1 Tablet By Mouth Once A Day  Allergies (verified): 1)  ! Codeine 2)  ! Prinivil (Lisinopril) 3)  ! Pravachol (Pravastatin Sodium)  Past History:  Past Medical History: Reviewed history from 10/11/2009 and no changes required. 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 S/P CSection S/P arthroscopic shoulder surgery S/P melanoma surgery from ant neck region in 1995 S/P anterior cervical discectomy & fusion by DrNudelman in 1996 S/P skin cancer removed from nose  Family History: Reviewed history from 10/11/2009 and no changes required. mother alive age 61---hx of DM father alive age 73 1 sibling alive age 7  hx of prostate cancer and hyperchol. 1 sibling alive age 90 mi--mother allergies--mother,2 brothers,son heart disease--pgf  Social History: Reviewed history from 10/11/2009 and no changes required. 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  The patient denies shortness of breath with activity, shortness of breath at rest, productive cough, non-productive cough, coughing up blood, chest pain, irregular heartbeats, acid heartburn, indigestion, loss of appetite, weight change, abdominal pain,  difficulty swallowing, sore throat, tooth/dental problems, headaches, nasal congestion/difficulty breathing through nose, sneezing, ear ache, anxiety, depression, hand/feet swelling, change in color of mucus, and fever.    Vital Signs:  Patient profile:   65 year old female Height:      60 inches Weight:      129.38 pounds BMI:     25.36 O2 Sat:      99 % on Room air Temp:     98.4 degrees F oral Pulse rate:   68 / minute BP sitting:   108 / 72  (left arm) Cuff size:   regular  Vitals Entered By: Carver Fila (November 19, 2009 11:09 AM)  O2 Flow:  Room air CC: sleep consult. Comments meds and allergies updated Phone number updated Carver Fila  November 19, 2009 11:09 AM    Physical Exam  General:  wd female in nad Eyes:  PERRLA and EOMI.   Nose:  mild septal deviation to the left, but patent Mouth:  mild elongation of soft palate and uvula Neck:  no jvd, tmg, LN Lungs:  totally clear to auscultation Heart:  rrr, no mrg Abdomen:  soft and nontender, bs+ Extremities:  no edema or cyanosis  pulses intact distally Neurologic:  alert and oriented, moves all 4.    Impression & Recommendations:  Problem # 1:  OBSTRUCTIVE SLEEP APNEA (ICD-327.23)  the pt has mild osa by her recent sleep study, but very few symptoms at this point.  I have had a long discussion with the pt about sleep apnea, including its impact on QOL and CV health.  Her degree of sleep apnea does not represent a significant health risk for her, and therefore the decision to treat this should depend on how much it is impacting her QOL.  I have discussed the various treatment options for this, including wt reduction with avoiding supine sleep and sedatives, dental appliance, and cpap.  Her upper airway anatomy is not overly abnormal, therefore surgery is really not an option.  The pt will consider all of this, and let me know if she wishes to treat more aggressively.  I have also told her this can worsen over time  if she gains weight, and to keep track of her symptomatology.  Other Orders: Consultation Level IV (16109)  Patient Instructions: 1)  work on modest weight loss 2)  stay off back while sleeping as much as possible 3)  avoid sedatives at bedtime. 4)  If you wish to treat your sleep apnea more aggressively, could try dental appliance or cpap. 5)  followup with me as needed.

## 2010-04-07 ENCOUNTER — Telehealth (INDEPENDENT_AMBULATORY_CARE_PROVIDER_SITE_OTHER): Payer: Self-pay | Admitting: *Deleted

## 2010-04-14 NOTE — Progress Notes (Signed)
Summary: dermatitis<<<order for 2nd opinion  Phone Note Call from Patient Call back at Work Phone [phone]   Caller: Patient Call For: nadel Summary of Call: pt was seen 1/23. she says the dermatitis has spread. requests to speak to nurse re: having labs done. [facility]. pt cell [phone] Initial call taken by: Tivis Ringer, CNA,  April 07, 2010 2:53 PM  Follow-up for Phone Call        Spoke with pt and she states she c/o a rash at last OV on 03-21-10. She states since then the rash has gotten worse and her dermatologists did a biopsy that came back inconclusive and she thinks it might be due to some type of food or environmental allergy. Pt wants to know does Dr. Kriste Basque think she should see an allergists? Please advise. Carron Curie CMA  April 07, 2010 3:50 PM   Additional Follow-up for Phone Call Additional follow up Details #1::        per SN---yes she certainly could consider allergy eval to see if they can find anything out----SN would rec derm 2nd opinion at med center---[facility].  we can send in this referral if she wants [place] to.  thanks Randell Loop CMA  April 07, 2010 3:57 PM   Pt aware of 2nd opinion referral to [facility] and agrees with this plan. Order sent to [facility]. Additional Follow-up by: Michel Bickers CMA,  April 07, 2010 4:06 PM

## 2010-05-02 ENCOUNTER — Encounter: Payer: Self-pay | Admitting: Pulmonary Disease

## 2010-05-02 ENCOUNTER — Ambulatory Visit: Payer: Self-pay

## 2010-05-06 ENCOUNTER — Ambulatory Visit
Admission: RE | Admit: 2010-05-06 | Discharge: 2010-05-06 | Disposition: A | Discharge status: Home / Self Care | Payer: BC Managed Care – PPO | Source: Ambulatory Visit | Attending: Obstetrics & Gynecology | Admitting: Obstetrics & Gynecology

## 2010-05-06 ENCOUNTER — Telehealth (INDEPENDENT_AMBULATORY_CARE_PROVIDER_SITE_OTHER): Payer: Self-pay | Admitting: *Deleted

## 2010-05-06 DIAGNOSIS — Z1239 Encounter for other screening for malignant neoplasm of breast: Secondary | ICD-10-CM

## 2010-05-10 NOTE — Progress Notes (Signed)
Summary: sinus > z pak  Phone Note Call from Patient   Caller: Patient Call For: nadel Summary of Call: pt c/o "sore bones in nose" w/ sinus infection x 1 month. headache/ pressure. [place]. pt cell [phone] Initial call taken by: Tivis Ringer, CNA,  May 06, 2010 8:54 AM  Follow-up for Phone Call        Called, spoke with pt.  She c/o nasal pressure, pressure under eyes, nasal congestion, runny nose, and fullness sensatation in ears.  States she is not blowing any mucus out of nose.  Sxs started x 1 month ago but have gotten worse.  States z pak usually works well.    Allergies - verified:  ! CODEINE ! PRINIVIL (LISINOPRIL) ! PRAVACHOL (PRAVASTATIN SODIUM)  [facility] Follow-up by: Gweneth Dimitri RN,  May 06, 2010 9:42 AM  Additional Follow-up for Phone Call Additional follow up Details #1::        per SN---ok for pt to have zpak #1  take as directed, also recs for mucinex 2 by mouth two times a day with plenty of fluids and saline nasal spray as needed .  thanks Randell Loop CMA  May 06, 2010 11:40 AM   Called, spoke with pt.  She was informed of above recs per SN and verbalized understanding.  Aware zpak sent to pharm.  Will call back if sxs worsen or do not improve. Additional Follow-up by: Gweneth Dimitri RN,  May 06, 2010 12:01 PM    New/Updated Medications: ZITHROMAX Z-PAK 250 MG TABS (AZITHROMYCIN) take as directed Prescriptions: ZITHROMAX Z-PAK 250 MG TABS (AZITHROMYCIN) take as directed  #1 x 0   Entered by:   Gweneth Dimitri RN   Authorized by:   Michele Mcalpine MD   Signed by:   Gweneth Dimitri RN on 05/06/2010   Method used:   Electronically to        [facility]* (retail)       [address]       [place], [place]  [id number]       Ph: [phone]       Fax: [phone]   RxID:   [id number]

## 2010-05-17 NOTE — Letter (Signed)
Summary: Dermatology/[facility]  Dermatology/[facility]   Imported By: Sherian Rein 05/12/2010 10:15:02  _____________________________________________________________________  External Attachment:    Type:   Image     Comment:   External Document

## 2010-07-12 NOTE — Assessment & Plan Note (Signed)
[facility]                         GASTROENTEROLOGY OFFICE NOTE   NAME:Donathan, ETTEL ALBERGO                      MRN:          [id number]  DATE:05/28/2007                            DOB:          06-18-1945    Ms. Semidey is a 65 year old white female who has a family history of  colon cancer in her aunt and cousin who has apparently been diagnosed at  [facility] as having Lynch Syndrome. She really denies lower GI  problems at this time, taking daily fiber supplements and many herbal  supplements for constipation and for acid reflux per Dr. Shelva Majestic at  [facility] in [place].   I previously saw her for chronic abdominal pain and gastroparesis which  apparently was felt secondary to an herbal prescription that she was  taking, and this seemed to resolved after consultation at [facility]. She currently is eating a fairly regular diet. Denies  anorexia, weight loss, but does have chronic dyspepsia and reflux  symptoms and is not on PPI therapy. On reviewing her chart, she had a  clinical electrogastrogram at [facility] in April 2004 which  showed a mixed gastric dysrhythmia. At that time, she was empirically  treated with prokinetic agents.   Dezhane has a long history of irritable bowel syndrome with gas and  bloating and GERD. Her last endoscopy and colonoscopies were in 2001.  She is followed medically by Dr. Alroy Dust and is currently on fish  oil, calcium, aspirin 81 mg a day, and a variety of multivitamins and  also Toprol XL 25 mg a day and Maxzide 12.5 mg a day along with Xanax  0.5 mg twice a day for hypertension.   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 She had  a neck fusion in 1996.   FAMILY HISTORY:  As mentioned above positive for Lynch syndrome.   SOCIAL HISTORY:  She is married, lives with her husband. Works for  [employer]. She has some college education. She does not smoke  or abuse ethanol.   REVIEW OF SYSTEMS:  Noncontributory without current cardiovascular or  pulmonary complaint. She eats a regular diet and denies any specific  food intolerances.   PHYSICAL EXAMINATION:  GENERAL:  She is a healthy-appearing white female  appearing younger than her stated age. She is 5 feet tall and weighs 127  pounds.  VITAL SIGNS:  Blood pressure is 126/70, and pulse was 60 and regular. I  could not appreciate stigmata of chronic liver disease.  CHEST:  Clear, and she was in regular rhythm without murmurs, gallops,  or rubs.  ABDOMEN:  Entirely benign without organomegaly, masses, or tenderness.  Bowel sounds were normal.  EXTREMITIES:  Unremarkable.   ASSESSMENT:  1. Family history of possible Lynch Syndrome with need for screening      colonoscopy.  2. Chronic gastroesophageal reflux disease, treated with ginger      extracts, rule out Barrett's mucosa.  3. Previous history of gastroparesis, probably related to unknown  herbal substances.  4. Well-controlled hypertension.  5. History of previous cervical spinal surgery.  6. History of well-controlled hyperlipidemia.   RECOMMENDATIONS:  1. Outpatient endoscopy and colonoscopy at her convenience.  2. Continue all other multiple medications per Dr. Kriste Basque.  3. Consider re-institution of regular PPI therapy.   ADDENDUM:  CBC and metabolic profiles were normal September 2008. I have  also asked the patient to continue her fiber supplements which seem to  be keeping her bowels normal.     Vania Rea. Jarold Motto, MD, Caleen Essex, FAGA  Electronically Signed    DRP/MedQ  DD: 05/28/2007  DT: 05/28/2007  Job #: [id number]   cc:   Lonzo Cloud. Kriste Basque, MD

## 2010-07-15 NOTE — Op Note (Signed)
[facility]  Patient:    Wendy [name], Wendy [name]                      MRN: [id number] Proc. Date: 08/24/00 Adm. Date:  [id number] Attending:  Loura Halt Ii CC:         Dr. 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  OPERATION PERFORMED:  Excision of biopsy site, nasal dorsum.  SURGEON:  Alfredia Ferguson, M.D.  ANESTHESIA:  2% Xylocaine 1:100,000 epinephrine.  INDICATIONS FOR PROCEDURE:  The patient is a 65 year old woman with a biopsy proven squamous cell carcinoma on the nasal dorsum.  On todays visit, the biopsy site has healed nicely.  There is no evidence of residual squamous cell carcinoma although the patient wishes to have the area excised to ensure the margins are clear.  She understands she will be trading what she has for a permanent and potentially unsightly scar.  In spite of that, she wishes to proceed with the surgery.  DESCRIPTION OF PROCEDURE:  Skin markers were placed in elliptical fashion around the biopsy site.  Local anesthesia was infiltrated.  After waiting approximately 10 minutes, the nasal area was prepped and draped in a sterile fashion.  An elliptical excision of skin in the area of the biopsy site approximately 1 cm in length was carried out.  The specimen was was passed off for pathology.  The wound edges were undermined for a distance of a couple of millimeters in all directions.  Hemostasis was accomplished using pressure. The wound was closed using interrupted 6-0 nylon suture.  The area was cleansed and a light dressing was applied.  The patient was discharged home in satisfactory condition. DD:  08/24/00 TD:  08/24/00 Job: [id number]

## 2010-07-26 ENCOUNTER — Other Ambulatory Visit: Payer: Self-pay | Admitting: *Deleted

## 2010-07-26 MED ORDER — METOPROLOL SUCCINATE ER 25 MG PO TB24: 25.0000 mg | ORAL_TABLET | Freq: Every day | ORAL | Status: DC

## 2011-02-02 ENCOUNTER — Telehealth: Payer: Self-pay | Admitting: Pulmonary Disease

## 2011-02-02 DIAGNOSIS — K219 Gastro-esophageal reflux disease without esophagitis: Secondary | ICD-10-CM

## 2011-02-02 DIAGNOSIS — K5730 Diverticulosis of large intestine without perforation or abscess without bleeding: Secondary | ICD-10-CM

## 2011-02-02 DIAGNOSIS — E7800 Pure hypercholesterolemia, unspecified: Secondary | ICD-10-CM

## 2011-02-02 DIAGNOSIS — I10 Essential (primary) hypertension: Secondary | ICD-10-CM

## 2011-02-02 DIAGNOSIS — F411 Generalized anxiety disorder: Secondary | ICD-10-CM

## 2011-02-02 MED ORDER — ALISKIREN FUMARATE 150 MG PO TABS: 150.0000 mg | ORAL_TABLET | Freq: Every day | ORAL | Status: DC

## 2011-02-02 NOTE — Telephone Encounter (Signed)
Pt stated that her blood pressure medication is needing to be refilled into [facility], # [phone].  Antionette Fairy

## 2011-02-02 NOTE — Telephone Encounter (Signed)
I spoke with pt and she states she started back on tekturna x 5 months now. Pt states her blood pressure would run from 140/80 or higher just depends. Pt states she needs a refill on this. According to OV note 03/21/10 she was to stop this medication and monitor her BP. Pt is coming in for physical on 03/30/11. Please advise Dr. Kriste Basque, thanks

## 2011-02-02 NOTE — Telephone Encounter (Signed)
Called and lmom to make pt aware that labs have been put in the computer---and her BP meds have been sent to the pharmacy per pts request.

## 2011-02-02 NOTE — Telephone Encounter (Signed)
Pt is requesting to have labs drawn prior to 03/11/10 apt. Please advise Dr. Kriste Basque, what labs pt will need to have, thanks

## 2011-02-17 ENCOUNTER — Telehealth: Payer: Self-pay | Admitting: Pulmonary Disease

## 2011-02-17 MED ORDER — HYDROCOD POLST-CHLORPHEN POLST 10-8 MG/5ML PO LQCR: 5.0000 mL | Freq: Two times a day (BID) | ORAL | Status: DC | PRN

## 2011-02-17 NOTE — Telephone Encounter (Signed)
Pt is aware that Rx called to [facility] pharmacy.

## 2011-02-17 NOTE — Telephone Encounter (Signed)
Rx called in under SN-mistakenly placed in EPIC under CY.

## 2011-02-17 NOTE — Telephone Encounter (Signed)
Per SN----ok for tussionex  #4oz  1 tsp po every 12 hours prn cough with 2 refills.  thanks

## 2011-02-17 NOTE — Telephone Encounter (Signed)
Pt c/o sore throat, dry cough x 3 days . Started Mucinex 1200mg  last night, Chlortheniramine 4mg  and Zantac 75mg  about 1 hour ago. Cough is better today. Please advise. Denies n/v/d/c/s, fever, body aches. Also requesting refill on Xanax 0.5mg .  Please advise, thanks. [facility]  Allergies  Allergen Reactions  . Codeine     REACTION: nausea  . Lisinopril     REACTION: INTOL to Prinivil w/ pain on urination  . Pravastatin Sodium     REACTION: INTOL to Pravachol w/ myalgias

## 2011-02-20 ENCOUNTER — Telehealth: Payer: Self-pay | Admitting: Pulmonary Disease

## 2011-02-20 ENCOUNTER — Other Ambulatory Visit: Payer: Self-pay | Admitting: *Deleted

## 2011-02-20 MED ORDER — AZITHROMYCIN 250 MG PO TABS: ORAL_TABLET | ORAL | Status: AC

## 2011-02-20 MED ORDER — ALPRAZOLAM 0.5 MG PO TABS: 0.5000 mg | ORAL_TABLET | Freq: Three times a day (TID) | ORAL | Status: DC | PRN

## 2011-02-20 NOTE — Telephone Encounter (Signed)
Zpack #1 take as directed  mucinex dm Twice daily  As needed  Cough/congestion  Fluids and rest  Please contact office for sooner follow up if symptoms do not improve or worsen or seek emergency care

## 2011-02-20 NOTE — Telephone Encounter (Signed)
Called and spoke with pt ---c/o green sputum with cough and yellow nasal congestion x 1 week.  Still using mucinex 2 po bid with plenty of fluids.  Pt is requesting zpak to help clear this up.  Please advise.   Allergies  Allergen Reactions  . Codeine     REACTION: nausea  . Lisinopril     REACTION: INTOL to Prinivil w/ pain on urination  . Pravastatin Sodium     REACTION: INTOL to Pravachol w/ myalgias

## 2011-02-20 NOTE — Telephone Encounter (Signed)
Zpak sent to [facility] per patient request and she is aware of all recs per TP.

## 2011-03-21 ENCOUNTER — Other Ambulatory Visit: Payer: Self-pay | Admitting: Orthopedic Surgery

## 2011-03-27 ENCOUNTER — Encounter: Payer: Self-pay | Admitting: *Deleted

## 2011-03-27 ENCOUNTER — Other Ambulatory Visit (INDEPENDENT_AMBULATORY_CARE_PROVIDER_SITE_OTHER): Payer: BC Managed Care – PPO

## 2011-03-27 DIAGNOSIS — K5730 Diverticulosis of large intestine without perforation or abscess without bleeding: Secondary | ICD-10-CM

## 2011-03-27 DIAGNOSIS — E7800 Pure hypercholesterolemia, unspecified: Secondary | ICD-10-CM | POA: Diagnosis not present

## 2011-03-27 DIAGNOSIS — E559 Vitamin D deficiency, unspecified: Secondary | ICD-10-CM | POA: Diagnosis not present

## 2011-03-27 DIAGNOSIS — F411 Generalized anxiety disorder: Secondary | ICD-10-CM

## 2011-03-27 DIAGNOSIS — I10 Essential (primary) hypertension: Secondary | ICD-10-CM | POA: Diagnosis not present

## 2011-03-27 LAB — CBC WITH DIFFERENTIAL/PLATELET
Basophils Absolute: 0 10*3/uL (ref 0.0–0.1)
Eosinophils Absolute: 0.2 10*3/uL (ref 0.0–0.7)
Lymphocytes Relative: 28.4 % (ref 12.0–46.0)
MCHC: 34 g/dL (ref 30.0–36.0)
Monocytes Relative: 9.3 % (ref 3.0–12.0)
Neutrophils Relative %: 58.3 % (ref 43.0–77.0)
Platelets: 237 10*3/uL (ref 150.0–400.0)
RDW: 12.7 % (ref 11.5–14.6)

## 2011-03-27 LAB — URINALYSIS, ROUTINE W REFLEX MICROSCOPIC
Bilirubin Urine: NEGATIVE
Hgb urine dipstick: NEGATIVE
Nitrite: NEGATIVE
Urobilinogen, UA: 0.2 (ref 0.0–1.0)

## 2011-03-27 LAB — VITAMIN B12: Vitamin B-12: 651 pg/mL (ref 211–911)

## 2011-03-27 LAB — BASIC METABOLIC PANEL
CO2: 29 mEq/L (ref 19–32)
Calcium: 9.4 mg/dL (ref 8.4–10.5)
Chloride: 95 mEq/L — ABNORMAL LOW (ref 96–112)
Glucose, Bld: 95 mg/dL (ref 70–99)
Potassium: 4.4 mEq/L (ref 3.5–5.1)
Sodium: 131 mEq/L — ABNORMAL LOW (ref 135–145)

## 2011-03-27 LAB — HEPATIC FUNCTION PANEL
ALT: 20 U/L (ref 0–35)
AST: 22 U/L (ref 0–37)
Albumin: 4 g/dL (ref 3.5–5.2)
Alkaline Phosphatase: 47 U/L (ref 39–117)
Total Protein: 7 g/dL (ref 6.0–8.3)

## 2011-03-27 LAB — LIPID PANEL: Triglycerides: 78 mg/dL (ref 0.0–149.0)

## 2011-03-27 LAB — TSH: TSH: 1.34 u[IU]/mL (ref 0.35–5.50)

## 2011-03-28 ENCOUNTER — Other Ambulatory Visit: Payer: Self-pay | Admitting: Obstetrics & Gynecology

## 2011-03-28 DIAGNOSIS — Z1231 Encounter for screening mammogram for malignant neoplasm of breast: Secondary | ICD-10-CM

## 2011-03-30 ENCOUNTER — Encounter: Payer: BC Managed Care – PPO | Admitting: Pulmonary Disease

## 2011-03-30 ENCOUNTER — Ambulatory Visit (INDEPENDENT_AMBULATORY_CARE_PROVIDER_SITE_OTHER)
Admission: RE | Admit: 2011-03-30 | Discharge: 2011-03-30 | Disposition: A | Discharge status: Home / Self Care | Payer: Medicare Other | Source: Ambulatory Visit | Attending: Pulmonary Disease | Admitting: Pulmonary Disease

## 2011-03-30 ENCOUNTER — Ambulatory Visit (INDEPENDENT_AMBULATORY_CARE_PROVIDER_SITE_OTHER): Payer: Medicare Other | Admitting: Pulmonary Disease

## 2011-03-30 ENCOUNTER — Encounter: Payer: Self-pay | Admitting: Pulmonary Disease

## 2011-03-30 DIAGNOSIS — M47812 Spondylosis without myelopathy or radiculopathy, cervical region: Secondary | ICD-10-CM

## 2011-03-30 DIAGNOSIS — I10 Essential (primary) hypertension: Secondary | ICD-10-CM | POA: Diagnosis not present

## 2011-03-30 DIAGNOSIS — E7800 Pure hypercholesterolemia, unspecified: Secondary | ICD-10-CM

## 2011-03-30 DIAGNOSIS — K219 Gastro-esophageal reflux disease without esophagitis: Secondary | ICD-10-CM

## 2011-03-30 DIAGNOSIS — R0789 Other chest pain: Secondary | ICD-10-CM | POA: Diagnosis not present

## 2011-03-30 DIAGNOSIS — C439 Malignant melanoma of skin, unspecified: Secondary | ICD-10-CM

## 2011-03-30 DIAGNOSIS — F411 Generalized anxiety disorder: Secondary | ICD-10-CM

## 2011-03-30 DIAGNOSIS — K5730 Diverticulosis of large intestine without perforation or abscess without bleeding: Secondary | ICD-10-CM

## 2011-03-30 DIAGNOSIS — Z0000 Encounter for general adult medical examination without abnormal findings: Secondary | ICD-10-CM | POA: Diagnosis not present

## 2011-03-30 DIAGNOSIS — K589 Irritable bowel syndrome without diarrhea: Secondary | ICD-10-CM

## 2011-03-30 MED ORDER — METOPROLOL SUCCINATE ER 25 MG PO TB24: 25.0000 mg | ORAL_TABLET | Freq: Every day | ORAL | Status: DC

## 2011-03-30 MED ORDER — TRIAMTERENE-HCTZ 37.5-25 MG PO TABS: 1.0000 | ORAL_TABLET | Freq: Every day | ORAL | Status: DC

## 2011-03-30 MED ORDER — ALISKIREN FUMARATE 150 MG PO TABS: 75.0000 mg | ORAL_TABLET | Freq: Every day | ORAL | Status: DC

## 2011-03-30 NOTE — Patient Instructions (Signed)
Today we updated your med list in our EPIC system...    Continue your current medications the same...  We refilled your meds per request...  Today we did your follow up CXR & EKG...    Please call the PHONE TREE in a few days for your results...    Dial [id number] & when prompted enter your patient number followed by the # symbol...    Your patient number is:  [id number]  Keep up the good job w/ diet & exercise...  Call for any problems.Marland KitchenMarland Kitchen

## 2011-04-04 ENCOUNTER — Encounter: Payer: BC Managed Care – PPO | Admitting: Pulmonary Disease

## 2011-04-16 ENCOUNTER — Encounter: Payer: Self-pay | Admitting: Pulmonary Disease
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
## 2011-05-08 ENCOUNTER — Ambulatory Visit
Admission: RE | Admit: 2011-05-08 | Discharge: 2011-05-08 | Disposition: A | Discharge status: Home / Self Care | Payer: Medicare Other | Source: Ambulatory Visit | Attending: Obstetrics & Gynecology | Admitting: Obstetrics & Gynecology

## 2011-05-08 DIAGNOSIS — Z1231 Encounter for screening mammogram for malignant neoplasm of breast: Secondary | ICD-10-CM | POA: Diagnosis not present

## 2011-05-10 ENCOUNTER — Other Ambulatory Visit: Payer: Self-pay | Admitting: Obstetrics & Gynecology

## 2011-05-10 DIAGNOSIS — R928 Other abnormal and inconclusive findings on diagnostic imaging of breast: Secondary | ICD-10-CM

## 2011-05-11 DIAGNOSIS — Z85828 Personal history of other malignant neoplasm of skin: Secondary | ICD-10-CM | POA: Diagnosis not present

## 2011-05-11 DIAGNOSIS — D485 Neoplasm of uncertain behavior of skin: Secondary | ICD-10-CM | POA: Diagnosis not present

## 2011-05-11 DIAGNOSIS — Z85820 Personal history of malignant melanoma of skin: Secondary | ICD-10-CM | POA: Insufficient documentation

## 2011-05-11 DIAGNOSIS — L820 Inflamed seborrheic keratosis: Secondary | ICD-10-CM | POA: Diagnosis not present

## 2011-05-17 ENCOUNTER — Ambulatory Visit
Admission: RE | Admit: 2011-05-17 | Discharge: 2011-05-17 | Disposition: A | Discharge status: Home / Self Care | Payer: Medicare Other | Source: Ambulatory Visit | Attending: Obstetrics & Gynecology | Admitting: Obstetrics & Gynecology

## 2011-05-17 DIAGNOSIS — R928 Other abnormal and inconclusive findings on diagnostic imaging of breast: Secondary | ICD-10-CM

## 2011-05-24 ENCOUNTER — Other Ambulatory Visit: Payer: Self-pay | Admitting: Obstetrics & Gynecology

## 2011-05-24 DIAGNOSIS — Z124 Encounter for screening for malignant neoplasm of cervix: Secondary | ICD-10-CM | POA: Diagnosis not present

## 2011-06-05 DIAGNOSIS — M545 Low back pain: Secondary | ICD-10-CM | POA: Diagnosis not present

## 2011-06-05 DIAGNOSIS — M5030 Other cervical disc degeneration, unspecified cervical region: Secondary | ICD-10-CM | POA: Diagnosis not present

## 2011-06-07 DIAGNOSIS — M5030 Other cervical disc degeneration, unspecified cervical region: Secondary | ICD-10-CM | POA: Diagnosis not present

## 2011-06-09 DIAGNOSIS — M5030 Other cervical disc degeneration, unspecified cervical region: Secondary | ICD-10-CM | POA: Diagnosis not present

## 2011-06-14 DIAGNOSIS — M5030 Other cervical disc degeneration, unspecified cervical region: Secondary | ICD-10-CM | POA: Diagnosis not present

## 2011-06-16 DIAGNOSIS — M5030 Other cervical disc degeneration, unspecified cervical region: Secondary | ICD-10-CM | POA: Diagnosis not present

## 2011-06-17 DIAGNOSIS — J019 Acute sinusitis, unspecified: Secondary | ICD-10-CM | POA: Diagnosis not present

## 2011-06-23 DIAGNOSIS — M5030 Other cervical disc degeneration, unspecified cervical region: Secondary | ICD-10-CM | POA: Diagnosis not present

## 2011-06-30 DIAGNOSIS — M5030 Other cervical disc degeneration, unspecified cervical region: Secondary | ICD-10-CM | POA: Diagnosis not present

## 2011-07-10 DIAGNOSIS — M5030 Other cervical disc degeneration, unspecified cervical region: Secondary | ICD-10-CM | POA: Diagnosis not present

## 2011-07-17 DIAGNOSIS — L821 Other seborrheic keratosis: Secondary | ICD-10-CM | POA: Diagnosis not present

## 2011-07-17 DIAGNOSIS — C4491 Basal cell carcinoma of skin, unspecified: Secondary | ICD-10-CM | POA: Diagnosis not present

## 2011-07-19 ENCOUNTER — Telehealth: Payer: Self-pay | Admitting: Pulmonary Disease

## 2011-07-19 NOTE — Telephone Encounter (Signed)
Spoke with pt and notified of recs per SN. She verbalized understanding and states nothing further needed.  

## 2011-07-19 NOTE — Telephone Encounter (Signed)
I spoke with pt and she states her BP has been elevated. Yesterday it was 177/101, last night 150/88, and this AM 160/83. She is not sure why it's running this high. She took 1 mucinex DM yesterday for scratchy throat and ibuprofen for her TMJ pain x 2 weeks now. She is taking tekturna 1/2 tablet daily, toprol 25 mg QD, and maxzide 37.5-25 QD. She also c/o scratcy throat--been doing salt water rinses and doing saline rinse--requesting recs regarding this. Please advise SN thanks   Allergies  Allergen Reactions  . Codeine     REACTION: nausea  . Lisinopril     REACTION: INTOL to Prinivil w/ pain on urination  . Pravastatin Sodium     REACTION: INTOL to Pravachol w/ myalgias

## 2011-07-19 NOTE — Telephone Encounter (Signed)
Per SN---if BP stays above systolic then increase the tekturna to 1 daily.  thanks

## 2011-07-19 NOTE — Telephone Encounter (Signed)
lmomtcb x1 

## 2011-09-04 ENCOUNTER — Telehealth: Payer: Self-pay | Admitting: Pulmonary Disease

## 2011-09-04 MED ORDER — AMOXICILLIN-POT CLAVULANATE 875-125 MG PO TABS: 1.0000 | ORAL_TABLET | Freq: Two times a day (BID) | ORAL | Status: AC

## 2011-09-04 MED ORDER — METHYLPREDNISOLONE 4 MG PO KIT: PACK | ORAL | Status: AC

## 2011-09-04 NOTE — Telephone Encounter (Signed)
Per SN--recs are for her to take augmentin 875mg   #14  1 po bid and medrol dosepak  #1  Take as directed with no refills, take align once daily while on the abx.  thanks

## 2011-09-04 NOTE — Telephone Encounter (Signed)
I spoke with pt and she c/o laryngitis, cough w. Chilton Si phlem, wheezing, slight PND, slight nasal congestion, sore throat x Thursday. Denies any f/c/s/n/v. She has been taking mucinex BID, chlorpheniramine. Pt is requesting further recs from SN. Please advise thanks'  Allergies  Allergen Reactions  . Codeine     REACTION: nausea  . Lisinopril     REACTION: INTOL to Prinivil w/ pain on urination  . Pravastatin Sodium     REACTION: INTOL to Pravachol w/ myalgias

## 2011-09-04 NOTE — Telephone Encounter (Signed)
Pt aware of Rx's sent and will take probiotic as well.

## 2011-09-18 DIAGNOSIS — L570 Actinic keratosis: Secondary | ICD-10-CM | POA: Diagnosis not present

## 2011-09-18 DIAGNOSIS — D485 Neoplasm of uncertain behavior of skin: Secondary | ICD-10-CM | POA: Diagnosis not present

## 2011-09-18 DIAGNOSIS — Z85828 Personal history of other malignant neoplasm of skin: Secondary | ICD-10-CM | POA: Diagnosis not present

## 2011-09-18 DIAGNOSIS — L821 Other seborrheic keratosis: Secondary | ICD-10-CM | POA: Insufficient documentation

## 2011-09-18 DIAGNOSIS — C4491 Basal cell carcinoma of skin, unspecified: Secondary | ICD-10-CM | POA: Diagnosis not present

## 2011-09-20 ENCOUNTER — Telehealth: Payer: Self-pay | Admitting: Pulmonary Disease

## 2011-09-20 NOTE — Telephone Encounter (Signed)
Spoke with pt. She states that for the past several months has been having a dry cough, hoarseness and full feeling in her throat. She states that her symptoms are always worse after she eats so she thinks may have acid reflux. She states that she has tried prilosec and this has not helped yet. I offered appt with MW for tomorrow and she declined stating only wants to see SN or TP. OV with TP for 09-28-11 and pt to call sooner if needed.

## 2011-09-21 ENCOUNTER — Telehealth: Payer: Self-pay | Admitting: Pulmonary Disease

## 2011-09-21 NOTE — Telephone Encounter (Signed)
Called spoke with patient who reported that SN had called in augmentin and a medrol dose pak on 09-04-11 for:  Randell Loop, [facility] 09/04/2011 2:10 PM Signed  Per SN--recs are for her to take augmentin 875mg  #14 1 po bid and medrol dosepak #1 Take as directed with no refills, take align once daily while on the abx. thanks Carver Fila, [place] 09/04/2011 8:58 AM Signed  I spoke with pt and she c/o laryngitis, cough w. Chilton Si phlem, wheezing, slight PND, slight nasal congestion, sore throat x Thursday. Denies any f/c/s/n/v. She has been taking mucinex BID, chlorpheniramine. Pt is requesting further recs from SN. Please advise thanks'  Pt stated that she never picked up the dose pak and is now wondering if she would benefit from taking that rx as opposed to coming in to be seen.  She is still c/o laryngitis, dry cough, and wheezing when she coughs.  Denies tightness, SOB, f/c/s.  Dr Sherene Sires please advise, thanks!  Allergies  Allergen Reactions  . Codeine     REACTION: nausea  . Lisinopril     REACTION: INTOL to Prinivil w/ pain on urination  . Pravastatin Sodium     REACTION: INTOL to Pravachol w/ myalgias

## 2011-09-22 NOTE — Telephone Encounter (Signed)
Per SN-cough after eating is usually related to reflux, schedule pt an appt to see SN or TP next week. Pt set to see SN on 09-28-11 at 9am. Pt is aware. Carron Curie, CMA

## 2011-09-22 NOTE — Telephone Encounter (Signed)
Patient c/o cough , worse 2-3 hours after eating, sputum is yellow. She also c/o PND, sore throat and hoarseness. Pt is aware SN is out of the office and we will speak with one of the other providers regarding her symptoms. There are no available appts for today. Pls advise. Allergies  Allergen Reactions  . Codeine     REACTION: nausea  . Lisinopril     REACTION: INTOL to Prinivil w/ pain on urination  . Pravastatin Sodium     REACTION: INTOL to Pravachol w/ myalgias

## 2011-09-22 NOTE — Telephone Encounter (Signed)
Pt states that she needs to come in for an appt not just meds.  Pt can be reached [phone].  Antionette Fairy

## 2011-09-28 ENCOUNTER — Ambulatory Visit: Payer: Medicare Other | Admitting: Adult Health

## 2011-09-28 ENCOUNTER — Ambulatory Visit: Payer: Medicare Other | Admitting: Pulmonary Disease

## 2011-10-09 DIAGNOSIS — L570 Actinic keratosis: Secondary | ICD-10-CM | POA: Diagnosis not present

## 2011-10-26 ENCOUNTER — Other Ambulatory Visit: Payer: Self-pay | Admitting: Pulmonary Disease

## 2011-10-26 MED ORDER — ALISKIREN FUMARATE 150 MG PO TABS: 75.0000 mg | ORAL_TABLET | Freq: Every day | ORAL | Status: DC

## 2011-10-31 MED ORDER — ALISKIREN FUMARATE 150 MG PO TABS: 75.0000 mg | ORAL_TABLET | Freq: Every day | ORAL | Status: DC

## 2011-11-07 DIAGNOSIS — L219 Seborrheic dermatitis, unspecified: Secondary | ICD-10-CM | POA: Diagnosis not present

## 2011-11-07 DIAGNOSIS — L820 Inflamed seborrheic keratosis: Secondary | ICD-10-CM | POA: Diagnosis not present

## 2011-11-07 DIAGNOSIS — H16139 Photokeratitis, unspecified eye: Secondary | ICD-10-CM | POA: Diagnosis not present

## 2011-11-07 DIAGNOSIS — I781 Nevus, non-neoplastic: Secondary | ICD-10-CM | POA: Diagnosis not present

## 2011-11-07 DIAGNOSIS — C4490 Unspecified malignant neoplasm of skin, unspecified: Secondary | ICD-10-CM | POA: Diagnosis not present

## 2011-11-07 DIAGNOSIS — Z9889 Other specified postprocedural states: Secondary | ICD-10-CM | POA: Diagnosis not present

## 2011-11-07 DIAGNOSIS — L570 Actinic keratosis: Secondary | ICD-10-CM | POA: Diagnosis not present

## 2011-12-08 ENCOUNTER — Ambulatory Visit (INDEPENDENT_AMBULATORY_CARE_PROVIDER_SITE_OTHER): Payer: Medicare Other

## 2011-12-08 DIAGNOSIS — Z23 Encounter for immunization: Secondary | ICD-10-CM

## 2011-12-11 DIAGNOSIS — Z23 Encounter for immunization: Secondary | ICD-10-CM

## 2011-12-25 DIAGNOSIS — H269 Unspecified cataract: Secondary | ICD-10-CM | POA: Insufficient documentation

## 2011-12-25 DIAGNOSIS — H2510 Age-related nuclear cataract, unspecified eye: Secondary | ICD-10-CM | POA: Diagnosis not present

## 2012-01-30 DIAGNOSIS — Z85820 Personal history of malignant melanoma of skin: Secondary | ICD-10-CM | POA: Diagnosis not present

## 2012-01-30 DIAGNOSIS — L820 Inflamed seborrheic keratosis: Secondary | ICD-10-CM | POA: Diagnosis not present

## 2012-01-30 DIAGNOSIS — Z85828 Personal history of other malignant neoplasm of skin: Secondary | ICD-10-CM | POA: Diagnosis not present

## 2012-01-30 DIAGNOSIS — Z8589 Personal history of malignant neoplasm of other organs and systems: Secondary | ICD-10-CM | POA: Diagnosis not present

## 2012-01-30 DIAGNOSIS — L570 Actinic keratosis: Secondary | ICD-10-CM | POA: Diagnosis not present

## 2012-02-13 ENCOUNTER — Other Ambulatory Visit: Payer: Self-pay | Admitting: Pulmonary Disease

## 2012-02-14 MED ORDER — ALPRAZOLAM 0.5 MG PO TABS: 0.5000 mg | ORAL_TABLET | Freq: Three times a day (TID) | ORAL | Status: DC | PRN

## 2012-02-14 NOTE — Telephone Encounter (Signed)
rx called to the pharmacy with no refills.  Pt will need ov with SN for further refills.

## 2012-02-22 ENCOUNTER — Other Ambulatory Visit: Payer: Self-pay | Admitting: Pulmonary Disease

## 2012-02-22 MED ORDER — TRIAMTERENE-HCTZ 37.5-25 MG PO TABS: 1.0000 | ORAL_TABLET | Freq: Every day | ORAL | Status: DC

## 2012-02-22 NOTE — Telephone Encounter (Signed)
Pharmacy requesting  Triamterene /hctz 37.5-25mg  <> take 1 tablet qd. Allergies  Allergen Reactions  . Codeine     REACTION: nausea  . Lisinopril     REACTION: INTOL to Prinivil w/ pain on urination  . Pravastatin Sodium     REACTION: INTOL to Pravachol w/ myalgias   Pt last fill 12-22-11 No ov scheduled. Dr Kriste Basque please advise  Thank you

## 2012-02-26 NOTE — Telephone Encounter (Signed)
Per sn if seen in last year and on med list as maint. Med ok to fill

## 2012-02-27 NOTE — Telephone Encounter (Signed)
rx has already been sent to the pharmacy.

## 2012-04-01 ENCOUNTER — Telehealth: Payer: Self-pay | Admitting: Pulmonary Disease

## 2012-04-01 MED ORDER — AZITHROMYCIN 250 MG PO TABS: ORAL_TABLET | ORAL | Status: DC

## 2012-04-01 NOTE — Telephone Encounter (Signed)
Per SN--zpak #1  Take as directed,  prednisone 20 mg  #12-- 1 bid x 3 days, 1 daily x 3 days, 1/2 every day until gone mucinex 2 po bid with plenty of fluids.

## 2012-04-01 NOTE — Telephone Encounter (Signed)
Called, spoke with pt.  Informed her of below recs per Dr. Kriste Basque.  She verbalized understanding of this and is requesting to hold off on having prednisone rx called in.  Pt states she "doesn't do well with this and would like to hold off at this stage."  She is aware zpak rx sent to [facility] and to take mucinex 2 po bid with plenty of fluids.  Advised to call back if she does change mind regarding prednisone or symptoms do not improve or worsen or seek emergency care if needed.  She verbalized understanding of instructions and voiced no further questions or concerns at this time.  Will sign off and route to SN as FYI regarding prednisone.

## 2012-04-01 NOTE — Telephone Encounter (Signed)
Called, spoke with pt.  C/o head and chest congestion, deep cough with yellow mucus, wheezing/rattling in chest this am, PND, and chest tightness.  Symptoms started on Thursday but worsened yesterday.  No f/c/s.  Started mucinex and chlorpheniramine today.  Requesting rxs.  Dr. Kriste Basque, pls advise.  Thank you.    Last OV with Dr. Kriste Basque 03/30/11 Pending OV with SN 04/19/12  [facility]  Allergies verified with pt: Allergies  Allergen Reactions  . Codeine     REACTION: nausea  . Lisinopril     REACTION: INTOL to Prinivil w/ pain on urination  . Pravastatin Sodium     REACTION: INTOL to Pravachol w/ myalgias

## 2012-04-03 DIAGNOSIS — H6120 Impacted cerumen, unspecified ear: Secondary | ICD-10-CM | POA: Diagnosis not present

## 2012-04-15 ENCOUNTER — Telehealth: Payer: Self-pay | Admitting: Pulmonary Disease

## 2012-04-15 DIAGNOSIS — E559 Vitamin D deficiency, unspecified: Secondary | ICD-10-CM

## 2012-04-15 DIAGNOSIS — E7800 Pure hypercholesterolemia, unspecified: Secondary | ICD-10-CM

## 2012-04-15 DIAGNOSIS — K5730 Diverticulosis of large intestine without perforation or abscess without bleeding: Secondary | ICD-10-CM

## 2012-04-15 DIAGNOSIS — M810 Age-related osteoporosis without current pathological fracture: Secondary | ICD-10-CM

## 2012-04-15 DIAGNOSIS — I10 Essential (primary) hypertension: Secondary | ICD-10-CM

## 2012-04-15 DIAGNOSIS — F411 Generalized anxiety disorder: Secondary | ICD-10-CM

## 2012-04-15 NOTE — Telephone Encounter (Signed)
Please advise on labs.. thanks.

## 2012-04-15 NOTE — Telephone Encounter (Signed)
Called, spoke with pt.  Pt states she would like labs put in today because she wants to come in the am to have this done.  Pt states she also wants vit d level checked.  Dr. Kriste Basque, pls advise on labs.  Thank you.

## 2012-04-15 NOTE — Telephone Encounter (Signed)
Called and spoke with pt and she is aware of labs in the computer for her and nothing further is needed.

## 2012-04-15 NOTE — Telephone Encounter (Signed)
Pt calling again in ref to previous msg.Wendy [name] ° ° °

## 2012-04-16 ENCOUNTER — Ambulatory Visit (INDEPENDENT_AMBULATORY_CARE_PROVIDER_SITE_OTHER): Payer: Medicare Other

## 2012-04-16 DIAGNOSIS — K5730 Diverticulosis of large intestine without perforation or abscess without bleeding: Secondary | ICD-10-CM | POA: Diagnosis not present

## 2012-04-16 DIAGNOSIS — E7800 Pure hypercholesterolemia, unspecified: Secondary | ICD-10-CM

## 2012-04-16 DIAGNOSIS — E559 Vitamin D deficiency, unspecified: Secondary | ICD-10-CM

## 2012-04-16 DIAGNOSIS — I10 Essential (primary) hypertension: Secondary | ICD-10-CM

## 2012-04-16 DIAGNOSIS — F411 Generalized anxiety disorder: Secondary | ICD-10-CM | POA: Diagnosis not present

## 2012-04-16 LAB — LIPID PANEL
Cholesterol: 197 mg/dL (ref 0–200)
HDL: 61.8 mg/dL (ref >39.00)
LDL Cholesterol: 120 mg/dL — ABNORMAL HIGH (ref 0–99)
VLDL: 15.4 mg/dL (ref 0.0–40.0)

## 2012-04-16 LAB — URINALYSIS, ROUTINE W REFLEX MICROSCOPIC
Ketones, ur: NEGATIVE
Specific Gravity, Urine: 1.01 (ref 1.000–1.030)
Urine Glucose: NEGATIVE
Urobilinogen, UA: 0.2 (ref 0.0–1.0)

## 2012-04-16 LAB — CBC WITH DIFFERENTIAL/PLATELET
Basophils Relative: 0.7 % (ref 0.0–3.0)
Eosinophils Relative: 3.9 % (ref 0.0–5.0)
HCT: 41.1 % (ref 36.0–46.0)
Hemoglobin: 13.6 g/dL (ref 12.0–15.0)
Lymphs Abs: 1.6 10*3/uL (ref 0.7–4.0)
Monocytes Relative: 9.1 % (ref 3.0–12.0)
Neutro Abs: 4 10*3/uL (ref 1.4–7.7)
RBC: 4.68 Mil/uL (ref 3.87–5.11)
WBC: 6.5 10*3/uL (ref 4.5–10.5)

## 2012-04-16 LAB — BASIC METABOLIC PANEL
BUN: 12 mg/dL (ref 6–23)
Calcium: 9.3 mg/dL (ref 8.4–10.5)
GFR: 70.95 mL/min (ref >60.00)
Glucose, Bld: 92 mg/dL (ref 70–99)
Sodium: 130 mEq/L — ABNORMAL LOW (ref 135–145)

## 2012-04-16 LAB — TSH: TSH: 1.22 u[IU]/mL (ref 0.35–5.50)

## 2012-04-16 LAB — HEPATIC FUNCTION PANEL
Albumin: 4 g/dL (ref 3.5–5.2)
Total Bilirubin: 0.5 mg/dL (ref 0.3–1.2)

## 2012-04-18 LAB — VITAMIN D 25 HYDROXY (VIT D DEFICIENCY, FRACTURES): Vit D, 25-Hydroxy: 72 ng/mL (ref 30–89)

## 2012-04-19 ENCOUNTER — Encounter: Payer: Self-pay | Admitting: Pulmonary Disease

## 2012-04-19 ENCOUNTER — Ambulatory Visit (INDEPENDENT_AMBULATORY_CARE_PROVIDER_SITE_OTHER)
Admission: RE | Admit: 2012-04-19 | Discharge: 2012-04-19 | Disposition: A | Discharge status: Home / Self Care | Payer: Medicare Other | Source: Ambulatory Visit | Attending: Pulmonary Disease | Admitting: Pulmonary Disease

## 2012-04-19 ENCOUNTER — Ambulatory Visit (INDEPENDENT_AMBULATORY_CARE_PROVIDER_SITE_OTHER): Payer: BC Managed Care – PPO | Admitting: Pulmonary Disease

## 2012-04-19 VITALS — BP 110/76 | HR 57 | Temp 98.5°F | Ht 60.0 in | Wt 134.2 lb

## 2012-04-19 DIAGNOSIS — I10 Essential (primary) hypertension: Secondary | ICD-10-CM

## 2012-04-19 DIAGNOSIS — F411 Generalized anxiety disorder: Secondary | ICD-10-CM

## 2012-04-19 DIAGNOSIS — Z23 Encounter for immunization: Secondary | ICD-10-CM

## 2012-04-19 DIAGNOSIS — M47812 Spondylosis without myelopathy or radiculopathy, cervical region: Secondary | ICD-10-CM

## 2012-04-19 DIAGNOSIS — K589 Irritable bowel syndrome without diarrhea: Secondary | ICD-10-CM

## 2012-04-19 MED ORDER — HYDROCORTISONE 2.5 % RE CREA: TOPICAL_CREAM | Freq: Two times a day (BID) | RECTAL | Status: DC

## 2012-04-19 MED ORDER — ALPRAZOLAM 0.5 MG PO TABS: 0.5000 mg | ORAL_TABLET | Freq: Three times a day (TID) | ORAL | Status: DC | PRN

## 2012-04-19 MED ORDER — TRIAMTERENE-HCTZ 37.5-25 MG PO TABS: ORAL_TABLET | ORAL | Status: DC

## 2012-04-19 MED ORDER — ALISKIREN FUMARATE 150 MG PO TABS: ORAL_TABLET | ORAL | Status: DC

## 2012-04-19 MED ORDER — METOPROLOL SUCCINATE ER 25 MG PO TB24: ORAL_TABLET | ORAL | Status: DC

## 2012-04-19 NOTE — Patient Instructions (Addendum)
Today we updated your med list in our EPIC system...    Continue your current medications the same...    We refilled your meds as requested...  Today we reviewed your recent FASTING blood work 7 gave you a copy for your records...  We also did your follow up CXR & EKG-    We will contact you w/ the results when avail...  Let's get back on the exercise program!!  Call for any questions...  Let's continue our yearly check ups but call anytime as needed for problems.Marland KitchenMarland Kitchen

## 2012-04-21 ENCOUNTER — Encounter: Payer: Self-pay | Admitting: Pulmonary Disease
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
## 2012-04-25 ENCOUNTER — Ambulatory Visit: Payer: Medicare Other | Admitting: Pulmonary Disease

## 2012-05-13 ENCOUNTER — Telehealth: Payer: Self-pay | Admitting: Pulmonary Disease

## 2012-05-13 MED ORDER — ALISKIREN FUMARATE 150 MG PO TABS: 150.0000 mg | ORAL_TABLET | Freq: Every day | ORAL | Status: DC

## 2012-05-13 NOTE — Telephone Encounter (Signed)
Pt's BP has been running high, 160/105, 170/102. She states that she has a bone spur in her neck and neck tightness. Her BP spikes when her neck is bothering her but here lately her BP is high even when her neck is not hurting. Is taking Advil and a muscle relaxer when neck pain is unbearable. When she does that her BP does go down a little.   She is wanting SN recommendations as to what to do about her BP.

## 2012-05-13 NOTE — Telephone Encounter (Signed)
Per SN---  Pt will need tighter control of her BP  recs are to increase the tekturna from 1/2 tablet daily to 1 full tablet.  Pt requested that a new rx be sent in for her since she will run out.  Pt is aware to keep a check on her BP at home and if this does not bring her BP down then she will then need to increase the toprol to 1 tablet daily and if this does not work she will need to increase the maxzide to 1 tablet daily.  Pt voiced her understanding of SN recs and she is aware to call back for any questions or concerns.  Nothing further is needed. Pt is aware that new rx has been called in.

## 2012-05-15 ENCOUNTER — Encounter: Payer: Self-pay | Admitting: Gastroenterology

## 2012-05-17 ENCOUNTER — Other Ambulatory Visit: Payer: Self-pay

## 2012-05-17 DIAGNOSIS — Z1231 Encounter for screening mammogram for malignant neoplasm of breast: Secondary | ICD-10-CM

## 2012-05-22 DIAGNOSIS — L570 Actinic keratosis: Secondary | ICD-10-CM | POA: Diagnosis not present

## 2012-05-22 DIAGNOSIS — D485 Neoplasm of uncertain behavior of skin: Secondary | ICD-10-CM | POA: Diagnosis not present

## 2012-05-22 DIAGNOSIS — I781 Nevus, non-neoplastic: Secondary | ICD-10-CM | POA: Diagnosis not present

## 2012-05-22 DIAGNOSIS — Z85820 Personal history of malignant melanoma of skin: Secondary | ICD-10-CM | POA: Diagnosis not present

## 2012-05-22 DIAGNOSIS — Z85828 Personal history of other malignant neoplasm of skin: Secondary | ICD-10-CM | POA: Diagnosis not present

## 2012-05-23 ENCOUNTER — Telehealth: Payer: Self-pay | Admitting: Pulmonary Disease

## 2012-05-23 NOTE — Telephone Encounter (Signed)
Per phone note on 05-13-12 advice was as follows: Per SN---  Pt will need tighter control of her BP  recs are to increase the tekturna from 1/2 tablet daily to 1 full tablet. Pt requested that a new rx be sent in for her since she will run out. Pt is aware to keep a check on her BP at home and if this does not bring her BP down then she will then need to increase the toprol to 1 tablet daily and if this does not work she will need to increase the maxzide to 1 tablet daily. Pt voiced her understanding of SN recs and she is aware to call back for any questions or concerns. Nothing further is needed. Pt is aware that new rx has been called in.    I spoke with the pt and she states that she increased the tekturna to 1 full tablet and BP was still running high, 177/101, so she states that she did increase the toprol to 1 full tablet but then states she took it this way "off and on." I advised the pt of SN recs again as above and advised she needed to take medications as directed in order to get control of her BP. Pt made an appt for 05-28-12 with SN in case the above regimen does not work by then. Carron Curie, CMA

## 2012-05-24 ENCOUNTER — Ambulatory Visit: Payer: Medicare Other | Admitting: Adult Health

## 2012-05-25 DIAGNOSIS — L570 Actinic keratosis: Secondary | ICD-10-CM | POA: Insufficient documentation

## 2012-05-28 ENCOUNTER — Encounter: Payer: Self-pay | Admitting: Pulmonary Disease

## 2012-05-28 ENCOUNTER — Ambulatory Visit (INDEPENDENT_AMBULATORY_CARE_PROVIDER_SITE_OTHER): Payer: Medicare Other | Admitting: Pulmonary Disease

## 2012-05-28 VITALS — BP 128/82 | HR 68 | Temp 97.8°F | Ht 60.0 in | Wt 135.6 lb

## 2012-05-28 DIAGNOSIS — E7800 Pure hypercholesterolemia, unspecified: Secondary | ICD-10-CM | POA: Diagnosis not present

## 2012-05-28 DIAGNOSIS — K5730 Diverticulosis of large intestine without perforation or abscess without bleeding: Secondary | ICD-10-CM

## 2012-05-28 DIAGNOSIS — M47812 Spondylosis without myelopathy or radiculopathy, cervical region: Secondary | ICD-10-CM

## 2012-05-28 DIAGNOSIS — K219 Gastro-esophageal reflux disease without esophagitis: Secondary | ICD-10-CM

## 2012-05-28 DIAGNOSIS — I10 Essential (primary) hypertension: Secondary | ICD-10-CM | POA: Diagnosis not present

## 2012-05-28 DIAGNOSIS — M810 Age-related osteoporosis without current pathological fracture: Secondary | ICD-10-CM

## 2012-05-28 DIAGNOSIS — K589 Irritable bowel syndrome without diarrhea: Secondary | ICD-10-CM

## 2012-05-28 MED ORDER — TRAMADOL HCL 50 MG PO TABS
50.0000 mg | ORAL_TABLET | Freq: Three times a day (TID) | ORAL | Status: DC | PRN
Start: 2012-05-28 — End: 2013-10-21

## 2012-05-28 MED ORDER — TRIAMTERENE-HCTZ 37.5-25 MG PO TABS: 1.0000 | ORAL_TABLET | Freq: Every day | ORAL | Status: DC

## 2012-05-28 MED ORDER — METOPROLOL SUCCINATE ER 25 MG PO TB24: 25.0000 mg | ORAL_TABLET | Freq: Every day | ORAL | Status: DC

## 2012-05-28 NOTE — Patient Instructions (Addendum)
Today we updated your med list in our EPIC system...    Continue your current medications the same...  We will arrange for a bone density Test & we will contact you w/ the results when avail...  Call for any questions.Marland KitchenMarland Kitchen

## 2012-05-29 ENCOUNTER — Ambulatory Visit (INDEPENDENT_AMBULATORY_CARE_PROVIDER_SITE_OTHER)
Admission: RE | Admit: 2012-05-29 | Discharge: 2012-05-29 | Disposition: A | Discharge status: Home / Self Care | Payer: Medicare Other | Source: Ambulatory Visit | Attending: Pulmonary Disease | Admitting: Pulmonary Disease

## 2012-05-29 ENCOUNTER — Inpatient Hospital Stay: Admission: RE | Admit: 2012-05-29 | Payer: Medicare Other | Source: Ambulatory Visit

## 2012-05-29 ENCOUNTER — Ambulatory Visit
Admission: RE | Admit: 2012-05-29 | Discharge: 2012-05-29 | Disposition: A | Discharge status: Home / Self Care | Payer: Medicare Other | Source: Ambulatory Visit

## 2012-05-29 DIAGNOSIS — Z1231 Encounter for screening mammogram for malignant neoplasm of breast: Secondary | ICD-10-CM | POA: Diagnosis not present

## 2012-05-29 DIAGNOSIS — M810 Age-related osteoporosis without current pathological fracture: Secondary | ICD-10-CM | POA: Diagnosis not present

## 2012-05-30 ENCOUNTER — Ambulatory Visit: Payer: Medicare Other

## 2012-05-31 ENCOUNTER — Ambulatory Visit: Payer: Medicare Other

## 2012-06-05 ENCOUNTER — Ambulatory Visit: Payer: Medicare Other

## 2012-06-17 DIAGNOSIS — M47812 Spondylosis without myelopathy or radiculopathy, cervical region: Secondary | ICD-10-CM | POA: Diagnosis not present

## 2012-06-17 DIAGNOSIS — M5412 Radiculopathy, cervical region: Secondary | ICD-10-CM | POA: Diagnosis not present

## 2012-06-17 DIAGNOSIS — M542 Cervicalgia: Secondary | ICD-10-CM | POA: Diagnosis not present

## 2012-06-17 DIAGNOSIS — M5030 Other cervical disc degeneration, unspecified cervical region: Secondary | ICD-10-CM | POA: Diagnosis not present

## 2012-06-18 ENCOUNTER — Telehealth: Payer: Self-pay | Admitting: Pulmonary Disease

## 2012-06-18 ENCOUNTER — Other Ambulatory Visit: Payer: Self-pay | Admitting: Obstetrics & Gynecology

## 2012-06-18 DIAGNOSIS — N8189 Other female genital prolapse: Secondary | ICD-10-CM | POA: Diagnosis not present

## 2012-06-18 DIAGNOSIS — N951 Menopausal and female climacteric states: Secondary | ICD-10-CM | POA: Diagnosis not present

## 2012-06-18 DIAGNOSIS — Z124 Encounter for screening for malignant neoplasm of cervix: Secondary | ICD-10-CM | POA: Diagnosis not present

## 2012-06-18 DIAGNOSIS — Z01419 Encounter for gynecological examination (general) (routine) without abnormal findings: Secondary | ICD-10-CM | POA: Diagnosis not present

## 2012-06-18 NOTE — Telephone Encounter (Signed)
ATC patient x3 line busy

## 2012-06-19 NOTE — Telephone Encounter (Signed)
Leigh, before we call her back, can you please advise if SN has reviewed these results thanks!

## 2012-06-19 NOTE — Telephone Encounter (Signed)
Called and spoke with pt and she is aware of BMD results per SN.    BMD is normal.  recs to to cont the MVI, vit d, calcium and exercise.  Nothing further is needed.

## 2012-07-08 DIAGNOSIS — H04129 Dry eye syndrome of unspecified lacrimal gland: Secondary | ICD-10-CM | POA: Diagnosis not present

## 2012-07-08 DIAGNOSIS — T1510XA Foreign body in conjunctival sac, unspecified eye, initial encounter: Secondary | ICD-10-CM | POA: Diagnosis not present

## 2012-07-14 DIAGNOSIS — H04123 Dry eye syndrome of bilateral lacrimal glands: Secondary | ICD-10-CM | POA: Insufficient documentation

## 2012-07-15 ENCOUNTER — Other Ambulatory Visit: Payer: Self-pay | Admitting: Neurosurgery

## 2012-07-15 DIAGNOSIS — M47812 Spondylosis without myelopathy or radiculopathy, cervical region: Secondary | ICD-10-CM

## 2012-07-15 DIAGNOSIS — M5030 Other cervical disc degeneration, unspecified cervical region: Secondary | ICD-10-CM
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
## 2012-07-24 ENCOUNTER — Ambulatory Visit
Admission: RE | Admit: 2012-07-24 | Discharge: 2012-07-24 | Disposition: A | Discharge status: Home / Self Care | Payer: Medicare Other | Source: Ambulatory Visit | Attending: Neurosurgery | Admitting: Neurosurgery

## 2012-07-24 DIAGNOSIS — M47812 Spondylosis without myelopathy or radiculopathy, cervical region: Secondary | ICD-10-CM

## 2012-07-24 DIAGNOSIS — M5030 Other cervical disc degeneration, unspecified cervical region: Secondary | ICD-10-CM

## 2012-07-24 DIAGNOSIS — M4802 Spinal stenosis, cervical region: Secondary | ICD-10-CM | POA: Diagnosis not present

## 2012-07-29 DIAGNOSIS — L821 Other seborrheic keratosis: Secondary | ICD-10-CM | POA: Diagnosis not present

## 2012-07-29 DIAGNOSIS — M47812 Spondylosis without myelopathy or radiculopathy, cervical region: Secondary | ICD-10-CM | POA: Diagnosis not present

## 2012-07-29 DIAGNOSIS — Z85820 Personal history of malignant melanoma of skin: Secondary | ICD-10-CM | POA: Diagnosis not present

## 2012-07-29 DIAGNOSIS — M5030 Other cervical disc degeneration, unspecified cervical region: Secondary | ICD-10-CM | POA: Diagnosis not present

## 2012-07-29 DIAGNOSIS — L820 Inflamed seborrheic keratosis: Secondary | ICD-10-CM | POA: Diagnosis not present

## 2012-07-29 DIAGNOSIS — Z85828 Personal history of other malignant neoplasm of skin: Secondary | ICD-10-CM | POA: Diagnosis not present

## 2012-07-29 DIAGNOSIS — M5020 Other cervical disc displacement, unspecified cervical region: Secondary | ICD-10-CM | POA: Diagnosis not present

## 2012-07-29 DIAGNOSIS — D489 Neoplasm of uncertain behavior, unspecified: Secondary | ICD-10-CM | POA: Diagnosis not present

## 2012-07-29 DIAGNOSIS — G5600 Carpal tunnel syndrome, unspecified upper limb: Secondary | ICD-10-CM | POA: Diagnosis not present

## 2012-08-16 DIAGNOSIS — K649 Unspecified hemorrhoids: Secondary | ICD-10-CM | POA: Diagnosis not present

## 2012-08-16 DIAGNOSIS — N3000 Acute cystitis without hematuria: Secondary | ICD-10-CM | POA: Diagnosis not present

## 2012-10-17 DIAGNOSIS — H6120 Impacted cerumen, unspecified ear: Secondary | ICD-10-CM | POA: Diagnosis not present

## 2012-10-17 DIAGNOSIS — M26609 Unspecified temporomandibular joint disorder, unspecified side: Secondary | ICD-10-CM | POA: Diagnosis not present

## 2012-12-24 DIAGNOSIS — K625 Hemorrhage of anus and rectum: Secondary | ICD-10-CM | POA: Diagnosis not present

## 2012-12-24 DIAGNOSIS — R05 Cough: Secondary | ICD-10-CM | POA: Diagnosis not present

## 2012-12-24 DIAGNOSIS — Z1211 Encounter for screening for malignant neoplasm of colon: Secondary | ICD-10-CM | POA: Diagnosis not present

## 2012-12-24 DIAGNOSIS — K219 Gastro-esophageal reflux disease without esophagitis: Secondary | ICD-10-CM | POA: Diagnosis not present

## 2012-12-26 ENCOUNTER — Ambulatory Visit (INDEPENDENT_AMBULATORY_CARE_PROVIDER_SITE_OTHER): Payer: Medicare Other

## 2012-12-26 DIAGNOSIS — Z23 Encounter for immunization: Secondary | ICD-10-CM

## 2012-12-26 DIAGNOSIS — H2510 Age-related nuclear cataract, unspecified eye: Secondary | ICD-10-CM | POA: Diagnosis not present

## 2012-12-26 DIAGNOSIS — H43819 Vitreous degeneration, unspecified eye: Secondary | ICD-10-CM | POA: Diagnosis not present

## 2013-01-06 DIAGNOSIS — N3080 Other cystitis without hematuria: Secondary | ICD-10-CM | POA: Diagnosis not present

## 2013-01-06 DIAGNOSIS — N3000 Acute cystitis without hematuria: Secondary | ICD-10-CM | POA: Diagnosis not present

## 2013-01-14 DIAGNOSIS — N3080 Other cystitis without hematuria: Secondary | ICD-10-CM | POA: Diagnosis not present

## 2013-01-21 DIAGNOSIS — Z85820 Personal history of malignant melanoma of skin: Secondary | ICD-10-CM | POA: Diagnosis not present

## 2013-01-21 DIAGNOSIS — L821 Other seborrheic keratosis: Secondary | ICD-10-CM | POA: Diagnosis not present

## 2013-01-21 DIAGNOSIS — L570 Actinic keratosis: Secondary | ICD-10-CM | POA: Diagnosis not present

## 2013-01-21 DIAGNOSIS — L819 Disorder of pigmentation, unspecified: Secondary | ICD-10-CM | POA: Diagnosis not present

## 2013-02-03 DIAGNOSIS — Z85828 Personal history of other malignant neoplasm of skin: Secondary | ICD-10-CM | POA: Diagnosis not present

## 2013-02-03 DIAGNOSIS — Z85820 Personal history of malignant melanoma of skin: Secondary | ICD-10-CM | POA: Diagnosis not present

## 2013-02-03 DIAGNOSIS — L819 Disorder of pigmentation, unspecified: Secondary | ICD-10-CM | POA: Diagnosis not present

## 2013-02-03 DIAGNOSIS — L821 Other seborrheic keratosis: Secondary | ICD-10-CM | POA: Diagnosis not present

## 2013-02-13 ENCOUNTER — Other Ambulatory Visit: Payer: Self-pay | Admitting: Pulmonary Disease

## 2013-03-24 IMAGING — CR DG CHEST 2V
2 series · 2 of 2 positions shown · non-contrast
Comparison: PA and lateral chest 03/21/2010, 03/12/2009 and
01/13/2008.

CLINICAL DATA: Yearly chest exam.

CHEST - 2 VIEW

[view not recorded (1 of 2)]
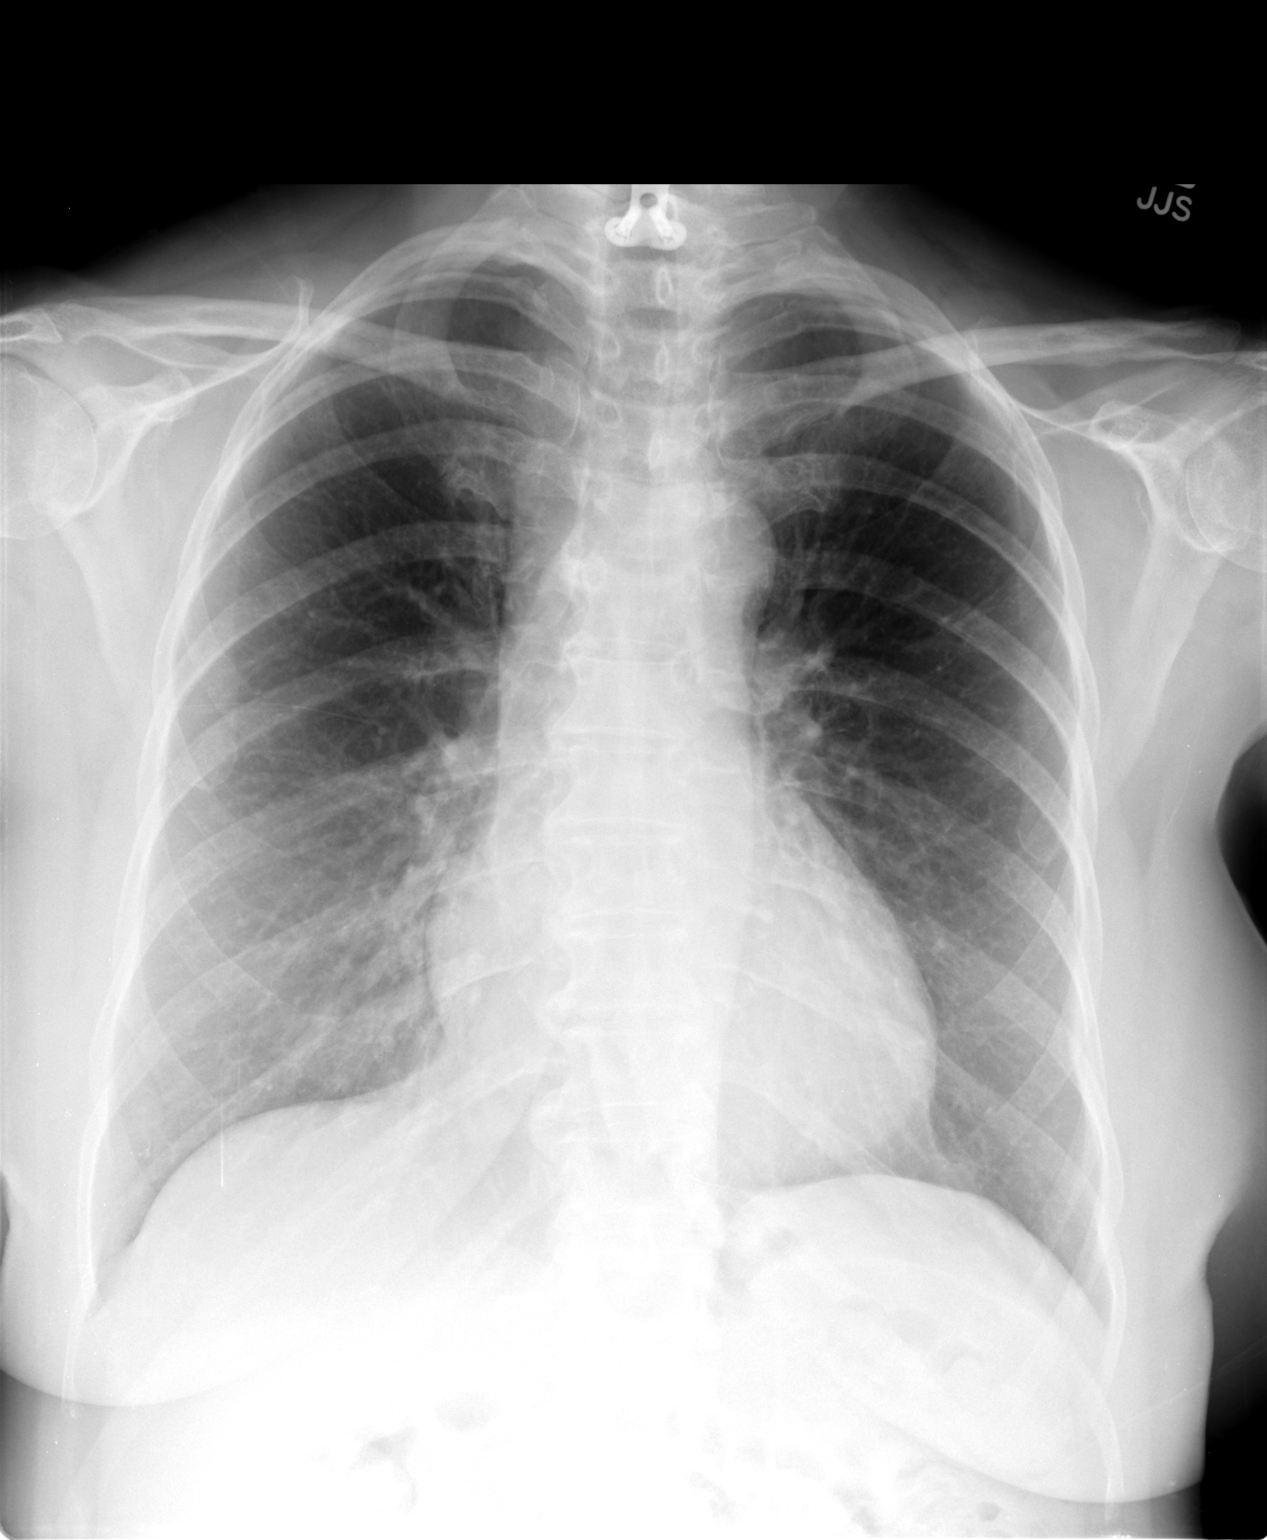

[view not recorded (2 of 2)]
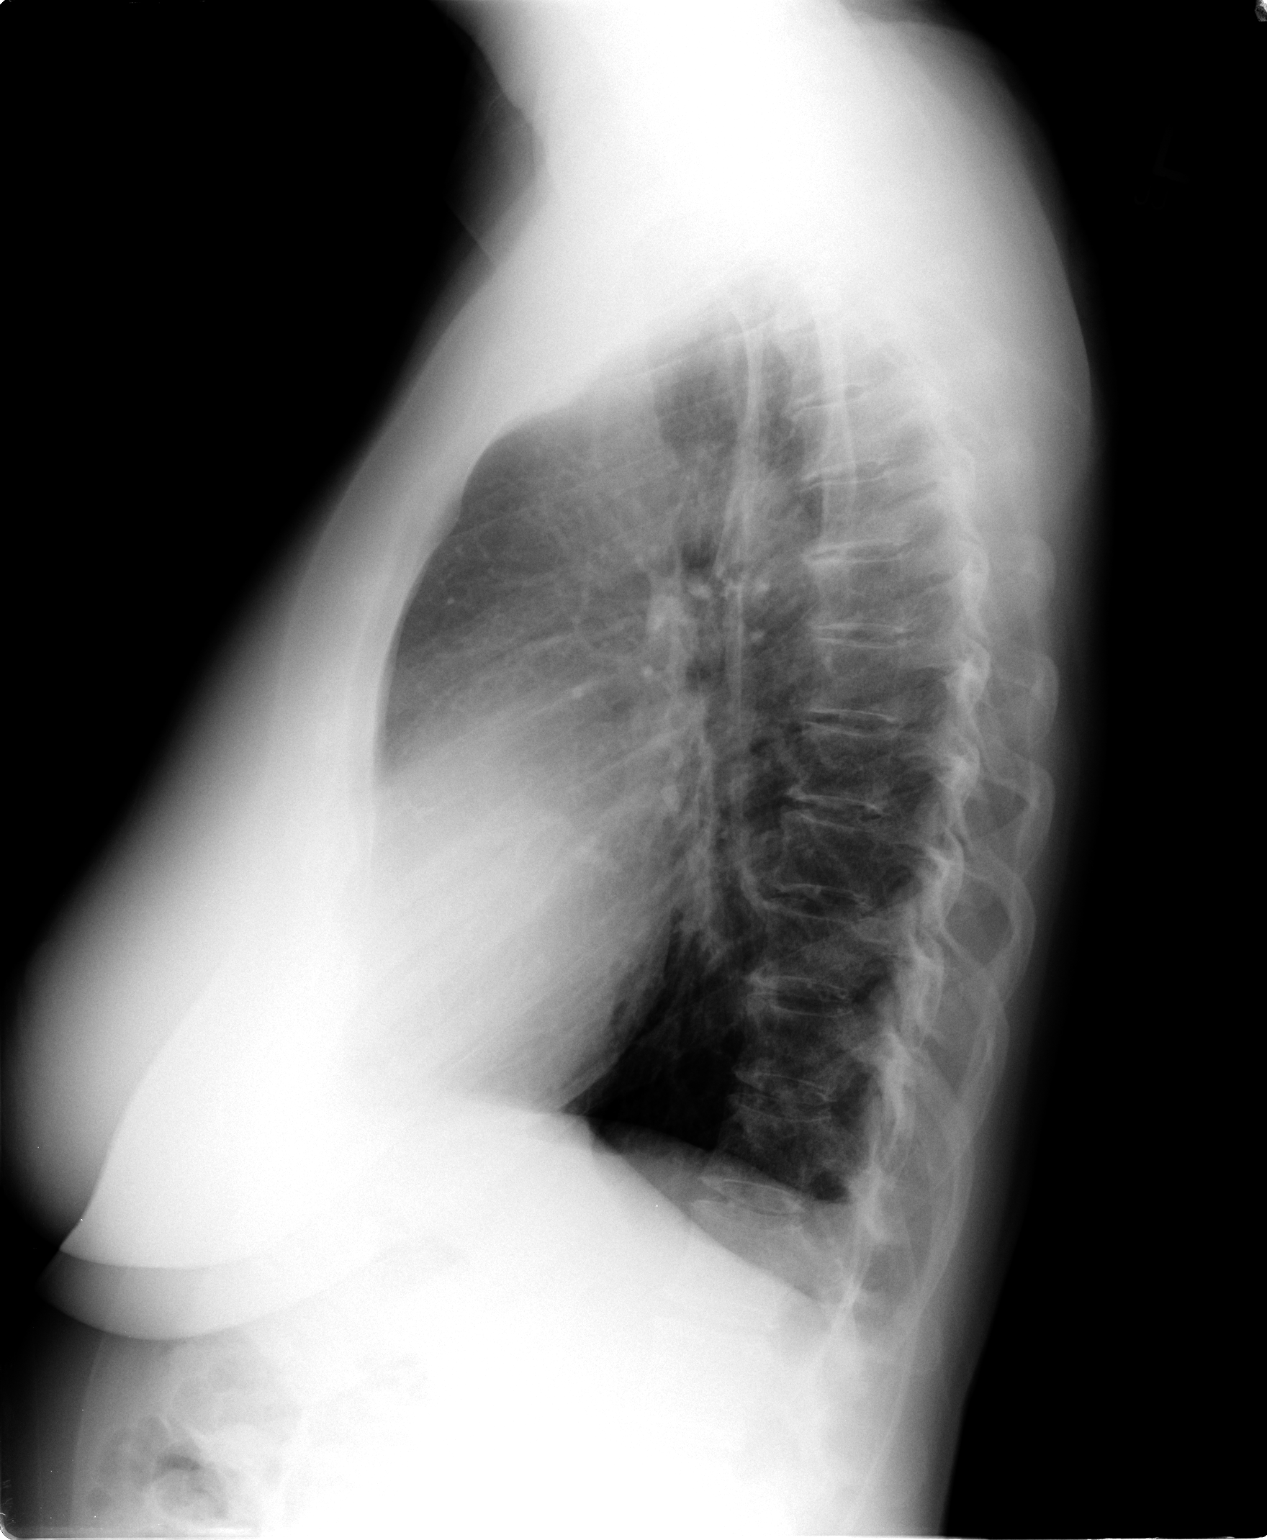

[2 of 2 positions shown; findings below may reference images not displayed]

FINDINGS: Lungs are clear.  Heart size is upper normal.  No
pneumothorax or effusion.  The patient is status post lower
cervical fusion.
IMPRESSION: No acute finding.  Stable compared to prior exams.

## 2013-03-26 ENCOUNTER — Encounter: Payer: Self-pay | Admitting: Gastroenterology

## 2013-03-28 DIAGNOSIS — Z1211 Encounter for screening for malignant neoplasm of colon: Secondary | ICD-10-CM | POA: Diagnosis not present

## 2013-03-28 DIAGNOSIS — K625 Hemorrhage of anus and rectum: Secondary | ICD-10-CM | POA: Diagnosis not present

## 2013-03-28 DIAGNOSIS — D126 Benign neoplasm of colon, unspecified: Secondary | ICD-10-CM | POA: Diagnosis not present

## 2013-03-28 DIAGNOSIS — K219 Gastro-esophageal reflux disease without esophagitis: Secondary | ICD-10-CM | POA: Diagnosis not present

## 2013-03-28 DIAGNOSIS — D131 Benign neoplasm of stomach: Secondary | ICD-10-CM | POA: Diagnosis not present

## 2013-04-03 ENCOUNTER — Telehealth: Payer: Self-pay | Admitting: Gastroenterology

## 2013-04-03 NOTE — Telephone Encounter (Signed)
Transferred GI Care to Dr Collene Mares

## 2013-04-16 ENCOUNTER — Telehealth: Payer: Self-pay | Admitting: Pulmonary Disease

## 2013-04-16 DIAGNOSIS — E7800 Pure hypercholesterolemia, unspecified: Secondary | ICD-10-CM

## 2013-04-16 DIAGNOSIS — E559 Vitamin D deficiency, unspecified: Secondary | ICD-10-CM

## 2013-04-16 DIAGNOSIS — Z0000 Encounter for general adult medical examination without abnormal findings: Secondary | ICD-10-CM

## 2013-04-16 DIAGNOSIS — F411 Generalized anxiety disorder: Secondary | ICD-10-CM

## 2013-04-16 DIAGNOSIS — I10 Essential (primary) hypertension: Secondary | ICD-10-CM

## 2013-04-16 DIAGNOSIS — K5730 Diverticulosis of large intestine without perforation or abscess without bleeding: Secondary | ICD-10-CM

## 2013-04-16 NOTE — Telephone Encounter (Signed)
Orders have been placed. Pt is aware. 

## 2013-04-18 ENCOUNTER — Other Ambulatory Visit (INDEPENDENT_AMBULATORY_CARE_PROVIDER_SITE_OTHER): Payer: Medicare Other

## 2013-04-18 ENCOUNTER — Telehealth: Payer: Self-pay | Admitting: Pulmonary Disease

## 2013-04-18 DIAGNOSIS — K5730 Diverticulosis of large intestine without perforation or abscess without bleeding: Secondary | ICD-10-CM | POA: Diagnosis not present

## 2013-04-18 DIAGNOSIS — Z0000 Encounter for general adult medical examination without abnormal findings: Secondary | ICD-10-CM | POA: Diagnosis not present

## 2013-04-18 DIAGNOSIS — E559 Vitamin D deficiency, unspecified: Secondary | ICD-10-CM

## 2013-04-18 DIAGNOSIS — E7800 Pure hypercholesterolemia, unspecified: Secondary | ICD-10-CM | POA: Diagnosis not present

## 2013-04-18 DIAGNOSIS — F411 Generalized anxiety disorder: Secondary | ICD-10-CM

## 2013-04-18 DIAGNOSIS — I10 Essential (primary) hypertension: Secondary | ICD-10-CM | POA: Diagnosis not present

## 2013-04-18 LAB — BASIC METABOLIC PANEL
BUN: 10 mg/dL (ref 6–23)
CALCIUM: 9.5 mg/dL (ref 8.4–10.5)
CO2: 30 mEq/L (ref 19–32)
Chloride: 93 mEq/L — ABNORMAL LOW (ref 96–112)
Creatinine, Ser: 0.9 mg/dL (ref 0.4–1.2)
GFR: 68.86 mL/min (ref >60.00)
GLUCOSE: 95 mg/dL (ref 70–99)
POTASSIUM: 4.5 meq/L (ref 3.5–5.1)
SODIUM: 129 meq/L — AB (ref 135–145)

## 2013-04-18 LAB — URINALYSIS, ROUTINE W REFLEX MICROSCOPIC
BILIRUBIN URINE: NEGATIVE
Hgb urine dipstick: NEGATIVE
KETONES UR: NEGATIVE
Nitrite: NEGATIVE
PH: 6.5 (ref 5.0–8.0)
RBC / HPF: NONE SEEN (ref 0–?)
SPECIFIC GRAVITY, URINE: 1.015 (ref 1.000–1.030)
Total Protein, Urine: NEGATIVE
URINE GLUCOSE: NEGATIVE
UROBILINOGEN UA: 0.2 (ref 0.0–1.0)

## 2013-04-18 LAB — LIPID PANEL
CHOL/HDL RATIO: 3
Cholesterol: 199 mg/dL (ref 0–200)
HDL: 59.5 mg/dL (ref >39.00)
LDL CALC: 110 mg/dL — AB (ref 0–99)
Triglycerides: 147 mg/dL (ref 0.0–149.0)
VLDL: 29.4 mg/dL (ref 0.0–40.0)

## 2013-04-18 LAB — CBC WITH DIFFERENTIAL/PLATELET
BASOS ABS: 0 10*3/uL (ref 0.0–0.1)
Basophils Relative: 0.4 % (ref 0.0–3.0)
Eosinophils Absolute: 0.1 10*3/uL (ref 0.0–0.7)
Eosinophils Relative: 1.8 % (ref 0.0–5.0)
HEMATOCRIT: 40.2 % (ref 36.0–46.0)
HEMOGLOBIN: 13.5 g/dL (ref 12.0–15.0)
LYMPHS ABS: 1.2 10*3/uL (ref 0.7–4.0)
Lymphocytes Relative: 18.6 % (ref 12.0–46.0)
MCHC: 33.6 g/dL (ref 30.0–36.0)
MCV: 90.4 fl (ref 78.0–100.0)
MONO ABS: 0.5 10*3/uL (ref 0.1–1.0)
MONOS PCT: 8.2 % (ref 3.0–12.0)
NEUTROS ABS: 4.4 10*3/uL (ref 1.4–7.7)
Neutrophils Relative %: 71 % (ref 43.0–77.0)
PLATELETS: 276 10*3/uL (ref 150.0–400.0)
RBC: 4.45 Mil/uL (ref 3.87–5.11)
RDW: 11.7 % (ref 11.5–14.6)
WBC: 6.2 10*3/uL (ref 4.5–10.5)

## 2013-04-18 LAB — HEPATIC FUNCTION PANEL
ALK PHOS: 51 U/L (ref 39–117)
ALT: 26 U/L (ref 0–35)
AST: 27 U/L (ref 0–37)
Albumin: 4.2 g/dL (ref 3.5–5.2)
BILIRUBIN DIRECT: 0.1 mg/dL (ref 0.0–0.3)
BILIRUBIN TOTAL: 0.7 mg/dL (ref 0.3–1.2)
Total Protein: 7.4 g/dL (ref 6.0–8.3)

## 2013-04-18 LAB — TSH: TSH: 1.63 u[IU]/mL (ref 0.35–5.50)

## 2013-04-18 NOTE — Telephone Encounter (Signed)
lmtcb x1 

## 2013-04-18 NOTE — Telephone Encounter (Signed)
Pt says she can be reached at [phone].Elnita Maxwell

## 2013-04-21 ENCOUNTER — Other Ambulatory Visit: Payer: Medicare Other

## 2013-04-21 DIAGNOSIS — E559 Vitamin D deficiency, unspecified: Secondary | ICD-10-CM | POA: Diagnosis not present

## 2013-04-21 NOTE — Telephone Encounter (Signed)
Lab added on. Pt advised. [place] Bing, CMA

## 2013-04-22 ENCOUNTER — Encounter: Payer: Self-pay | Admitting: Pulmonary Disease

## 2013-04-22 ENCOUNTER — Ambulatory Visit (INDEPENDENT_AMBULATORY_CARE_PROVIDER_SITE_OTHER): Payer: Medicare Other | Admitting: Pulmonary Disease

## 2013-04-22 ENCOUNTER — Encounter (INDEPENDENT_AMBULATORY_CARE_PROVIDER_SITE_OTHER): Payer: Self-pay

## 2013-04-22 VITALS — BP 110/66 | HR 62 | Temp 97.6°F | Resp 16 | Ht 60.0 in | Wt 139.0 lb

## 2013-04-22 DIAGNOSIS — E7800 Pure hypercholesterolemia, unspecified: Secondary | ICD-10-CM

## 2013-04-22 DIAGNOSIS — K589 Irritable bowel syndrome without diarrhea: Secondary | ICD-10-CM

## 2013-04-22 DIAGNOSIS — M47812 Spondylosis without myelopathy or radiculopathy, cervical region: Secondary | ICD-10-CM

## 2013-04-22 DIAGNOSIS — C439 Malignant melanoma of skin, unspecified: Secondary | ICD-10-CM

## 2013-04-22 DIAGNOSIS — K5730 Diverticulosis of large intestine without perforation or abscess without bleeding: Secondary | ICD-10-CM

## 2013-04-22 DIAGNOSIS — K219 Gastro-esophageal reflux disease without esophagitis: Secondary | ICD-10-CM

## 2013-04-22 DIAGNOSIS — I10 Essential (primary) hypertension: Secondary | ICD-10-CM

## 2013-04-22 LAB — VITAMIN D 25 HYDROXY (VIT D DEFICIENCY, FRACTURES): VIT D 25 HYDROXY: 58 ng/mL (ref 30–89)

## 2013-04-22 MED ORDER — TRIAMTERENE-HCTZ 37.5-25 MG PO TABS: 1.0000 | ORAL_TABLET | Freq: Every day | ORAL | Status: DC

## 2013-04-22 MED ORDER — METOPROLOL SUCCINATE ER 25 MG PO TB24: 25.0000 mg | ORAL_TABLET | Freq: Every day | ORAL | Status: DC

## 2013-04-22 NOTE — Patient Instructions (Signed)
Today we updated your med list in our EPIC system...    Continue your current medications the same...  We refilled your BP meds today and wrote a new prescription for ANUSOL-HC cream to use for your hemorrhoids...  Today we reviewed your recent blood work & gave you a copy for your records...  Call for any questions.Marland KitchenMarland Kitchen

## 2013-04-22 NOTE — Progress Notes (Signed)
Subjective:     Patient ID: Wendy [name], female   DOB: 1945-10-23, 68 y.o.   MRN: [id number]  HPI 68 y/o WF here for a follow up visit... she has multiple medical problems as noted below...   ~  March 30, 2011:  Yearly ROV> Wendy [name] reports doing well over the last yr w/o new complaints or concerns; she continues to get accupuncture regularly at [facility] for her neck & back pain, indigestion, and stress...    BP controlled on ToprolXL, Maxzide, & Tekturna; BP= 110/68 today & she denies palpit, dizzy, SOB, edema, etc...    She notes some atypCP on & off & was prev under the care of DrTorelli, having prev been evaluated by Elissa Lovett, DrNasher; CXR- heart at upper lim of norm, lungs clear; EKG- SBrady, rate54, otherw WNL...    Chol has not been at goal but she is adamant about using diet & supplements but NO STATIN meds; currently on FishOil, CoQ10, RYR, etc & TChol 217, TG 78, HDL 61, LDL 141 & she is happy w/ this, declines med offer...    GI> she has been rec to take PPI therapy but declines & uses digestive enzymes, "digest more", & Accupuncture which she says helps her symptoms; she was prev eval by [facility], [facility], & [facility]; followed by DrPatterson w/ +FamHx Lynch syndrome; last colonoscopy 2009 w/ long redundant colon & o lesions...    Hx Cx spondylosis & accupuncture helps this as well...    She had a melanoma removed from her anterior neck in 1995 & continues to f/u w/ [facility], +seborrheic dermatitis etc... We reviewed prob list, meds, xrays and labs> see below for updates >>   CXR 1/13 showed heart at upper lim of norm, lungs clear...  EKG 1/13 showed SBrady, rate54, otherw WNL, NAD...  LABS 1/13 showed> FLP not at goal on diet w/ LDL 141;  Chems- essent wnl;  CBC- ok;  TSH, VitD, VitB12- ok;  UA- clear.   ~  April 19, 2012:  Yearly ROV & Wendy [name] has noted some intermittent elev BPs and stomach acting up... We reviewed the following medical problems during today's office visit >>   HBP> on MetopER25-1/2, Tekturna150-1/2, Maxzide25-1/2; BP= 110/76 & she denies CP, palpit, SOB, edema, etc...    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    Chol> on various supplements FishOil, RYR, CoQ10; FLP shows TChol 197, TG 77, HDL 62, LDL 120    GI- GERD, Divertics, IBS> on Digestive enz & Probiotic; they help her intermittent abd discomfort; last EGD & Colon was 4/09 by DrPatterson w/ redundant colon, no lesions; she has several cousins w/ Lynch syndrome...    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    Anxiety> on Alpraz0.5mg  prn- using 1/2 Qhs at present...    Hx Melanoma> removed from ant neck area in 1995, no known recurrence, she follows up w/ Derm Q74mo... We reviewed prob list, meds, xrays and labs> see below for updates >> we gave her the PNEUMOVAX today (2/14); she had Flu vaccine 10/13 & Td 7/11...  CXR 2/14 showed normal heart size, clear lungs, cerv fusion plate noted w/o change...  EKG 2/14 showed NSR, rate60,  wnl, NAD...  LABS 2/14:  FLP- ok x LDL=120;  Chems- ok x Na=130;  CBC- wnl;  TSH=1.22;  VitD=72;  UA=clear...   ~  May 28, 2012:  6wk ROV & add-on for BP check> pt called w/ elev BP at home (as high as 180/100), we increased her low dose [place] from 1/2 to 1 tab daily and she has noted improvement;  She is also c/o neck pain ("it's from a bone spur") & has received acupuncture & massage in the past- rec for rest, heat, Tramadol50 prn & she will f/u w/ her neurosurg...     HBP> on MetopER25, Tekturna150-1/2, Maxzide25; BP= 128/88 & she denies CP, palpit, SOB, edema, etc...    Cspine dis> on Advil & various supplements; s/p Cspine fusion 1996 by  [facility]; she gets massage therapy & accupuncture periodically; wants to try mTramadol50- ok... BMD 4/14 showed TScores +0.1 in Spine, and -0.6 in right FemNeck... Rec> calcium, MVI, VitD & wt bearing exercise...  ~  April 22, 2013:  32mo ROV & Wendy [name] has had a good year overall- we reviewed the following medical problems during today's office visit >>     HBP> on MetopER25, Tekturna150, Maxzide25; BP= 110/66 & she denies CP, palpit, SOB, edema, etc; she thinks she wants to decr the Tekturna to 1/2 tab- asked to watch BP at home...    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    Chol> on various supplements FishOil, RYR, CoQ10; FLP shows TChol 199, TG 147, HDL 60, LDL 110    GI- GERD, Divertics, IBS> on Digestive enz & Probiotic; they help her intermittent abd discomfort; sh ehad EGD & Colon 4/09 by DrPatterson w/ redundant colon, no lesions; she has several cousins w/ Lynch syndrome; she switched to [facility] w/ f/u EGD & Colon recently- notes pending, pt reports polyps seen in both endoscopies, f/u planned 46yrs; pt notes hemorrhoids & wonders about injections by [facility]- asked her to check w/ DrMann first & rx written for [place]...    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    Anxiety> on Alpraz0.5mg  prn- using 1/2 Qhs at present...    Hx Melanoma> removed from ant neck area in 1995, no known recurrence, she follows up w/ Derm Q52mo... We reviewed prob list, meds, xrays and labs> see below for updates >>   LABS 2/15:  FLP- improved as above;  Chems- ok x Na=129;  CBC- wnl;  TSH=1.63;   VitD=58...            Problem List:  PHYSICAL EXAMINATION (ICD-V70.0) -  ~  GYN= prev DrWein for PAP, etc... she reports "my bladder dropped" ~  Mammogram are up to date according to pt... ~  BMD> up to date & normal w/ TScores 4/14 -0.6.Marland KitchenMarland Kitchen  on Caltrate Bid, MVI, Vit D... ~  colonoscopy done 4/09 by DrPatterson and ?1/15 by [facility] (note pending)- f/u planned 5 yrs... ~  Immunizations:  she gets the yearly seasonal Flu vaccine each fall... she had TDAP here 2010... PNEUMOVAX 2/14...  Hx of VITREOUS HEMORRHAGE (ICD-379.23) -  eval and followed at [facility]... doing well w/o recurrence.  HYPERTENSION (ICD-401.9) >>  ~  on TOPROL XL 25mg /d,  MAXZIDE-25 daily, & TEKTURNA 150mg - taking 1/2 tab/d... prev followed by DrTorelli w/ good control per pt hx & she states that BPs are good at home too...  ~  1/12: BP= 110/72 & denies HA, fatigue, visual changes, CP, palipit, dizziness, syncope, dyspnea, edema, etc; she tried off the [place] for awhile but restarted this med. ~  1/13: BP= 110/68 & she remains mostly asymptomatic x for some atypCP... ~  CXR 1/13 showed heart at upper lim of norm, lungs clear... ~  2/14: on MetopER25-1/2, Tekturna150-1/2, Maxzide25-1/2; BP= 110/76 & she denies CP, palpit, SOB, edema, etc. ~  CXR 2/14 showed showed normal heart size, clear lungs, cerv fusion plate noted w/o change. ~  4/14: Meds incr by phone recently> on MetopER25, Tekturna150-1/2, Maxzide25; BP= 128/88 & she denies CP, palpit, SOB, edema, etc. ~  2/15: on MetopER25, Tekturna150, Maxzide25; BP= 110/66 & she denies CP, palpit, SOB, edema, etc; she thinks she wants to decr the Tekturna to 1/2 tab- asked to watch BP at home.   Hx of CHEST PAIN, ATYPICAL (ICD-786.59) - on ASA 81mg /d... hx atypical CP w/ eval DrTorelli including 2DEcho which was OK according to the pt and a neg GXT... she had seen DrBerry, Cherly Hensen, DrNasher in the past... ~  EKG 1/13 showed SBrady, rate54, otherw WNL, NAD... ~  EKG 2/14 showed NSR,  rate60, wnl, NAD.  HYPERCHOLESTEROLEMIA (ICD-272.0) - Eval and Rx from [facility] in [place]- on [place] 1000mg /d + Red Yeast Rice, CoQ10, & diet... she does not want med Rx. ~  [place] 6/08 by DrTorelli showed TChol 187, TG 97, HDL 48, LDL 120 ~  FLP 11/09 showed TChol 257, TG 119, HDL 51, LDL 157... pt refused med Rx, copy to DrTorelli. ~  FLP 6/10 by DrT showed TChol 214, TG 154, HDL 58, LDL 125 ~  FLP 1/11 here showed TChol 210, TG 120, HDL 55, LDL 131 ~  FLP 1/12 sowed TChol 224, TG 99, HDL 59, LDL 145... she continues to decline med rx, prefers diet & alternative rx. ~  FLP 1/13 on diet + supplements showed TChol TChol 217, TG 78, HDL 61, LDL 141... She is happy w/ these results. ~  [place] 2/14 on diet+supplements showed TChol 197, TG 77, HDL 62, LDL 120 ~  FLP 2/15 on diet+supplements showed  TChol 199, TG 147, HDL 60, LDL 110  GERD (ICD-530.81) - off prev Aciphex Rx & now takes "digestive enzymes" which she states helps... ~  EGD 4/09 by DrPatterson showed mild esophagitis only... rec> PPI Rx. ~  Pt reports f/u EGD 1/15 by [facility]- note pending, pt notes gastric polyp, HPylori neg, all benign...  DIVERTICULOSIS OF COLON (ICD-562.10) & IRRITABLE BOWEL SYNDROME (ICD-564.1) - prev on numerous herbal preps including "extended digestion enzymes"... prev evals by Leta Jungling, [place]... followed by DrPatterson w/ hx one aunt & cousin w/ colon cancer & dx w/ Lynch syndrome... ~  Colonoscopy 4/09 by DrPatterson showed long redundant colon, no lesions (w/o divertics or polyps). ~  1/11:  c/o hemorroid & requests cream= PROCTOSOL HC Prn use... ~  Pt reports colonoscopy 1/15 by [facility]- note pending, pt states +adenomatous polyp & asked to repeat in 5 yrs... Pt wonders about Hem treatment from [facility]- she is asked to check w/ DrMann 1st for a referral.  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 she gets massage therapy and  acupuncture at the [facility] center here in [place]... Rx Tramadol vs Advil. ~  [place] w/ CT Brain & CSpine at [facility]- neg, NAD... f/u eval by Ortho, DrDraper w/ MRI CSpine 6/11= mild sp stenosis & foraminal stenosis C3-4, C7-T1... Rx w/ PT but symptoms persist> f/u DrNudelman (seen 8/11- mild degen changes only, continue PT, accupuncture, etc)... ~  4/14: c/o incr neck pain & requests Tramadol trial, she has f/u appt w/ NS... ~  4/14: BMD 4/14 showed TScores +0.1 in Spine, and -0.6 in right FemNeck... Rec> calcium, MVI, VitD & wt bearing exercise.  Hx of DYSESTHESIA (ICD-782.0) - hx recurrent left facial dysesthesias in the 1990's w/ eval from DrSteifel et al (she also saw Rheum, ENT & Oral surgeon- w/ referral to [facility] oral surg dept and eventual rec for "[employer]" stress reduction program)... normal MRI Brain and symptoms lessened w/ Alpraz Rx...  ANXIETY (ICD-300.00) - on ALPRAZOLAM 0.25mg  Tid Prn... she has hx of anxiety and insomnia- Alprazolam helps...  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 she also had a skin cancer removed from her nose, and sees Derm every 6 months (DrLLomax)...   Past Surgical History  Procedure Laterality Date  . Cesarean section    . Arthroscopic shoulder surgery    . Melanoma surgery from ant neck region  1995  . Anterior cervical discectomy and fusion  1996    Dr. Sherwood Gambler  . Skin cancer removed from nose      Outpatient Encounter Prescriptions as of 04/22/2013  Medication Sig  . aliskiren (TEKTURNA) 150 MG tablet Take 1 tablet (150 mg total) by mouth daily.  Marland Kitchen ALPRAZolam (XANAX) 0.5 MG tablet TAKE 1 TABLET BY MOUTH 3 TIMES DAILY AS NEEDED FOR SLEEP OR ANXIETY.  Marland Kitchen aspirin 81 MG tablet Take 81 mg by mouth daily.  . Cholecalciferol (VITAMIN D) 2000 UNITS CAPS Take 1 capsule by mouth daily.  . Coenzyme Q10 (COQ10) 200 MG CAPS Take 1 capsule by mouth daily.  Marland Kitchen DIGEST  ENZYMES-ANTICHOLINERGIC PO Take 1 capsule by mouth 3 (three) times daily with meals.  . hydrocortisone (ANUSOL-HC) 2.5 % rectal cream Place rectally 2 (two) times daily. As directed  . ibuprofen (ADVIL,MOTRIN) 200 MG tablet Take 200 mg by mouth every 6 (six) hours as needed.  . metoprolol succinate (TOPROL-XL) 25 MG 24 hr tablet Take 1 tablet (25 mg total) by mouth daily. Take 1  tablet by mouth daily  . Multiple Vitamins-Minerals (MULTIVITAMIN & MINERAL PO) Take 1 tablet by mouth daily.  . Omega-3 Fatty Acids (THE VERY FINEST FISH OIL) LIQD Take 1 1/2 tbsp daily  . Probiotic Product (PROBIOTIC ACIDOPHILUS PO) Take 1 capsule by mouth daily.  . Red Yeast Rice 600 MG CAPS Take 2 capsules by mouth daily.  Marland Kitchen thiamine (VITAMIN B-1) 100 MG tablet Take 100 mg by mouth daily.  . traMADol (ULTRAM) 50 MG tablet Take 1 tablet (50 mg total) by mouth 3 (three) times daily as needed for pain.  Marland Kitchen triamterene-hydrochlorothiazide (MAXZIDE-25) 37.5-25 MG per tablet Take 1 tablet by mouth daily. Take 1  tablet  By mouth daily  . Zinc-Magnesium Aspart-Vit B6 (ZINC MAGNESIUM ASPARTATE) 150-3.83-10 MG CAPS Take 1 tablet by mouth daily.  . [DISCONTINUED] metoprolol succinate (TOPROL-XL) 25 MG 24 hr tablet Take 1 tablet (25 mg total) by mouth daily. Take 1  tablet by mouth daily  . [  DISCONTINUED] triamterene-hydrochlorothiazide (MAXZIDE-25) 37.5-25 MG per tablet Take 1 each (1 tablet total) by mouth daily. Take 1  tablet  By mouth daily    Allergies  Allergen Reactions  . Codeine     REACTION: nausea  . Lisinopril     REACTION: INTOL to Prinivil w/ pain on urination  . Pravastatin Sodium     REACTION: INTOL to Pravachol w/ myalgias    Current Medications, Allergies, Past Medical History, Past Surgical History, Family History, and Social History were reviewed in [employer] record.    Review of Systems        The patient complains of malaise, back pain, stiffness, and arthritis.  The  patient denies fever, chills, sweats, anorexia, fatigue, weakness, weight loss, sleep disorder, blurring, diplopia, eye irritation, eye discharge, vision loss, eye pain, photophobia, earache, ear discharge, tinnitus, decreased hearing, nasal congestion, nosebleeds, sore throat, hoarseness, chest pain, palpitations, syncope, dyspnea on exertion, orthopnea, PND, peripheral edema, cough, dyspnea at rest, excessive sputum, hemoptysis, wheezing, pleurisy, nausea, vomiting, diarrhea, constipation, change in bowel habits, abdominal pain, melena, hematochezia, jaundice, gas/bloating, indigestion/heartburn, dysphagia, odynophagia, dysuria, hematuria, urinary frequency, urinary hesitancy, nocturia, incontinence, joint pain, joint swelling, muscle cramps, muscle weakness, sciatica, restless legs, leg pain at night, leg pain with exertion, rash, itching, dryness, suspicious lesions, paralysis, paresthesias, seizures, tremors, vertigo, transient blindness, frequent falls, frequent headaches, difficulty walking, depression, anxiety, memory loss, confusion, cold intolerance, heat intolerance, polydipsia, polyphagia, polyuria, unusual weight change, abnormal bruising, bleeding, enlarged lymph nodes, urticaria, allergic rash, hay fever, and recurrent infections.     Objective:   Physical Exam     WD, WN, 68 y/o WF in NAD... GENERAL:  Alert & oriented; pleasant & cooperative... HEENT:  [place]/AT, EOM-wnl, PERRLA, Fundi-benign, EACs-clear, TMs-wnl, NOSE-clear, THROAT-clear & wnl. NECK:  Supple w/ fairROM; no JVD; normal carotid impulses w/o bruits; no thyromegaly or nodules palpated; no lymphadenopathy. Scars of ant cerv discectomy & melanoma surgery... CHEST:  Clear to P & A; without wheezes/ rales/ or rhonchi. HEART:  Regular Rhythm; without murmurs/ rubs/ or gallops. ABDOMEN:  Soft & nontender; normal bowel sounds; no organomegaly or masses detected. EXT: without deformities or arthritic changes; no varicose veins/ venous  insuffic/ or edema. NEURO:  CN's intact; motor testing normal; sensory testing normal; gait normal & balance OK. DERM:  scar ant neck region, no lesions noted; no rash etc...  RADIOLOGY DATA:  Reviewed in the EPIC EMR & discussed w/ the patient...  LABORATORY DATA:  Reviewed in the EPIC EMR & discussed w/ the patient...   Assessment:      HBP>  Controlled on ToprolXL, Tekturna, Maxzide; med doses adjusted & improved...  Hx AtypCP>  Stable w/o angina; uses OTC analgesics plus her accupuncture...  CHOL>  Long standing problem prev managed by DrTorelli; on supplements like FishOil, RYR, CoQ10 and improved- will continue diet & supplements, REFUSES MEDS...  GI> GERD, Divertics, IBS>  She uses herbal preps and digestive enz, plus her accupuncture which she notes is pretty helpful for her GI symptoms...  CSpine spondylosis>  Seen by DrDraper, Lianne Moris... She does accupuncture w/ improvement in her symptoms; ok to try Tramadol...  Anxiety>  She has Alprazolam for prn use & for insomnia...  Skin Cancers & Melanoma>  Followed by Payton Mccallum at [facility]...     Plan:     Patient's Medications  New Prescriptions   No medications on file  Previous Medications   ALISKIREN (TEKTURNA) 150 MG TABLET    Take 1 tablet (  150 mg total) by mouth daily.   ALPRAZOLAM (XANAX) 0.5 MG TABLET    TAKE 1 TABLET BY MOUTH 3 TIMES DAILY AS NEEDED FOR SLEEP OR ANXIETY.   ASPIRIN 81 MG TABLET    Take 81 mg by mouth daily.   CHOLECALCIFEROL (VITAMIN D) 2000 UNITS CAPS    Take 1 capsule by mouth daily.   COENZYME Q10 (COQ10) 200 MG CAPS    Take 1 capsule by mouth daily.   DIGEST ENZYMES-ANTICHOLINERGIC PO    Take 1 capsule by mouth 3 (three) times daily with meals.   HYDROCORTISONE (ANUSOL-HC) 2.5 % RECTAL CREAM    Place rectally 2 (two) times daily. As directed   IBUPROFEN (ADVIL,MOTRIN) 200 MG TABLET    Take 200 mg by mouth every 6 (six) hours as needed.   MULTIPLE VITAMINS-MINERALS (MULTIVITAMIN & MINERAL PO)     Take 1 tablet by mouth daily.   OMEGA-3 FATTY ACIDS (THE VERY FINEST FISH OIL) LIQD    Take 1 1/2 tbsp daily   PROBIOTIC PRODUCT (PROBIOTIC ACIDOPHILUS PO)    Take 1 capsule by mouth daily.   RED YEAST RICE 600 MG CAPS    Take 2 capsules by mouth daily.   THIAMINE (VITAMIN B-1) 100 MG TABLET    Take 100 mg by mouth daily.   TRAMADOL (ULTRAM) 50 MG TABLET    Take 1 tablet (50 mg total) by mouth 3 (three) times daily as needed for pain.   ZINC-MAGNESIUM ASPART-VIT B6 (ZINC MAGNESIUM ASPARTATE) 150-3.83-10 MG CAPS    Take 1 tablet by mouth daily.  Modified Medications   Modified Medication Previous Medication   METOPROLOL SUCCINATE (TOPROL-XL) 25 MG 24 HR TABLET metoprolol succinate (TOPROL-XL) 25 MG 24 hr tablet      Take 1 tablet (25 mg total) by mouth daily. Take 1  tablet by mouth daily    Take 1 tablet (25 mg total) by mouth daily. Take 1  tablet by mouth daily   TRIAMTERENE-HYDROCHLOROTHIAZIDE (MAXZIDE-25) 37.5-25 MG PER TABLET triamterene-hydrochlorothiazide (MAXZIDE-25) 37.5-25 MG per tablet      Take 1 tablet by mouth daily. Take 1  tablet  By mouth daily    Take 1 each (1 tablet total) by mouth daily. Take 1  tablet  By mouth daily  Discontinued Medications   No medications on file

## 2013-04-28 ENCOUNTER — Other Ambulatory Visit: Payer: Self-pay

## 2013-04-28 DIAGNOSIS — H6120 Impacted cerumen, unspecified ear: Secondary | ICD-10-CM | POA: Diagnosis not present

## 2013-04-28 DIAGNOSIS — M26609 Unspecified temporomandibular joint disorder, unspecified side: Secondary | ICD-10-CM | POA: Diagnosis not present

## 2013-04-28 DIAGNOSIS — Z1231 Encounter for screening mammogram for malignant neoplasm of breast: Secondary | ICD-10-CM

## 2013-05-30 ENCOUNTER — Ambulatory Visit: Payer: Medicare Other

## 2013-06-16 ENCOUNTER — Ambulatory Visit: Payer: Medicare Other

## 2013-06-16 ENCOUNTER — Ambulatory Visit
Admission: RE | Admit: 2013-06-16 | Discharge: 2013-06-16 | Disposition: A | Discharge status: Home / Self Care | Payer: Medicare Other | Source: Ambulatory Visit

## 2013-06-16 DIAGNOSIS — Z1231 Encounter for screening mammogram for malignant neoplasm of breast: Secondary | ICD-10-CM | POA: Diagnosis not present

## 2013-07-08 ENCOUNTER — Other Ambulatory Visit: Payer: Self-pay | Admitting: Pulmonary Disease

## 2013-07-08 DIAGNOSIS — K644 Residual hemorrhoidal skin tags: Secondary | ICD-10-CM | POA: Diagnosis not present

## 2013-07-08 DIAGNOSIS — N8189 Other female genital prolapse: Secondary | ICD-10-CM | POA: Diagnosis not present

## 2013-07-22 ENCOUNTER — Other Ambulatory Visit: Payer: Self-pay | Admitting: Dermatology

## 2013-07-22 DIAGNOSIS — L819 Disorder of pigmentation, unspecified: Secondary | ICD-10-CM | POA: Diagnosis not present

## 2013-07-22 DIAGNOSIS — L821 Other seborrheic keratosis: Secondary | ICD-10-CM | POA: Diagnosis not present

## 2013-07-22 DIAGNOSIS — Z85828 Personal history of other malignant neoplasm of skin: Secondary | ICD-10-CM | POA: Diagnosis not present

## 2013-07-22 DIAGNOSIS — Z85820 Personal history of malignant melanoma of skin: Secondary | ICD-10-CM | POA: Diagnosis not present

## 2013-07-22 DIAGNOSIS — C44519 Basal cell carcinoma of skin of other part of trunk: Secondary | ICD-10-CM | POA: Diagnosis not present

## 2013-08-01 DIAGNOSIS — G479 Sleep disorder, unspecified: Secondary | ICD-10-CM | POA: Diagnosis not present

## 2013-08-01 DIAGNOSIS — I10 Essential (primary) hypertension: Secondary | ICD-10-CM | POA: Diagnosis not present

## 2013-08-01 DIAGNOSIS — E7800 Pure hypercholesterolemia, unspecified: Secondary | ICD-10-CM | POA: Diagnosis not present

## 2013-08-01 DIAGNOSIS — L820 Inflamed seborrheic keratosis: Secondary | ICD-10-CM | POA: Diagnosis not present

## 2013-08-01 DIAGNOSIS — M2669 Other specified disorders of temporomandibular joint: Secondary | ICD-10-CM | POA: Diagnosis not present

## 2013-08-27 ENCOUNTER — Encounter: Payer: Self-pay | Admitting: Vascular Surgery

## 2013-08-28 ENCOUNTER — Encounter: Payer: Self-pay | Admitting: Vascular Surgery

## 2013-09-01 ENCOUNTER — Encounter: Payer: Self-pay | Admitting: Vascular Surgery

## 2013-09-01 ENCOUNTER — Other Ambulatory Visit: Payer: Self-pay

## 2013-09-01 DIAGNOSIS — I6529 Occlusion and stenosis of unspecified carotid artery: Secondary | ICD-10-CM

## 2013-09-02 ENCOUNTER — Ambulatory Visit (HOSPITAL_COMMUNITY)
Admission: RE | Admit: 2013-09-02 | Discharge: 2013-09-02 | Disposition: A | Discharge status: Home / Self Care | Payer: Medicare Other | Source: Ambulatory Visit | Attending: Vascular Surgery | Admitting: Vascular Surgery

## 2013-09-02 ENCOUNTER — Ambulatory Visit (INDEPENDENT_AMBULATORY_CARE_PROVIDER_SITE_OTHER): Payer: Medicare Other | Admitting: Vascular Surgery

## 2013-09-02 ENCOUNTER — Encounter: Payer: Self-pay | Admitting: Vascular Surgery

## 2013-09-02 VITALS — BP 143/81 | HR 63 | Ht 60.0 in | Wt 135.0 lb

## 2013-09-02 DIAGNOSIS — I6529 Occlusion and stenosis of unspecified carotid artery: Secondary | ICD-10-CM | POA: Diagnosis not present

## 2013-09-02 NOTE — Progress Notes (Signed)
Subjective:     Patient ID: Wendy [name], female   DOB: [birth year], 68 y.o.   MRN: [id number]  HPI this 68 year old female was referred by Dr. Mertha Finders for evaluation of possible carotid occlusive disease. Patient was being evaluated for temporomandibular joint problems and had a CT scan which revealed some calcified atheroma in the middle cranial fossa. Patient was then advised to get further evaluation of her carotid arteries. She denies any history of stroke, lateralizing weakness, aphasia, amaurosis fugax t, diplopia, blurred vision, or syncope..  Past Medical History  Diagnosis Date  . B12 deficiency   . Vitreous hemorrhage   . Hypertension   . Atypical chest pain   . Hypercholesteremia   . GERD (gastroesophageal reflux disease)   . Diverticulosis of colon   . IBS (irritable bowel syndrome)   . Cervical spondylosis without myelopathy   . Dysesthesia   . Anxiety   . Malignant melanoma     History  Substance Use Topics  . Smoking status: Never Smoker   . Smokeless tobacco: Not on file  . Alcohol Use: No    Family History  Problem Relation Age of Onset  . Diabetes Mother   . Heart disease Mother   . Hyperlipidemia Mother   . Hypertension Mother   . Cancer Father   . Deep vein thrombosis Father   . Heart attack Father   . Cancer Brother     Allergies  Allergen Reactions  . Codeine     REACTION: nausea  . Lisinopril     REACTION: INTOL to Prinivil w/ pain on urination  . Pravastatin Sodium     REACTION: INTOL to Pravachol w/ myalgias    Current outpatient prescriptions:ALPRAZolam (XANAX) 0.5 MG tablet, TAKE 1 TABLET BY MOUTH 3 TIMES DAILY AS NEEDED FOR SLEEP OR ANXIETY., Disp: 90 tablet, Rfl: 5;  aspirin 81 MG tablet, Take 81 mg by mouth daily., Disp: , Rfl: ;  Cholecalciferol (VITAMIN D) 2000 UNITS CAPS, Take 1 capsule by mouth daily., Disp: , Rfl: ;  Coenzyme Q10 (COQ10) 200 MG CAPS, Take 1 capsule by mouth daily., Disp: , Rfl:  DIGEST  ENZYMES-ANTICHOLINERGIC PO, Take 1 capsule by mouth 3 (three) times daily with meals., Disp: , Rfl: ;  hydrocortisone (ANUSOL-HC) 2.5 % rectal cream, Place rectally 2 (two) times daily. As directed, Disp: 30 g, Rfl: 11;  ibuprofen (ADVIL,MOTRIN) 200 MG tablet, Take 200 mg by mouth every 6 (six) hours as needed., Disp: , Rfl:  metoprolol succinate (TOPROL-XL) 25 MG 24 hr tablet, Take 1 tablet (25 mg total) by mouth daily. Take 1  tablet by mouth daily, Disp: 90 tablet, Rfl: 3;  Multiple Vitamins-Minerals (MULTIVITAMIN & MINERAL PO), Take 1 tablet by mouth daily., Disp: , Rfl: ;  Omega-3 Fatty Acids (THE VERY FINEST FISH OIL) LIQD, Take 1 1/2 tbsp daily, Disp: , Rfl: ;  Probiotic Product (PROBIOTIC ACIDOPHILUS PO), Take 1 capsule by mouth daily., Disp: , Rfl:  Red Yeast Rice 600 MG CAPS, Take 2 capsules by mouth daily., Disp: , Rfl: ;  TEKTURNA 150 MG tablet, TAKE ONE (1) TABLET BY MOUTH EVERY DAY, Disp: 90 tablet, Rfl: 0;  thiamine (VITAMIN B-1) 100 MG tablet, Take 100 mg by mouth daily., Disp: , Rfl: ;  traMADol (ULTRAM) 50 MG tablet, Take 1 tablet (50 mg total) by mouth 3 (three) times daily as needed for pain., Disp: 90 tablet, Rfl: 5 triamterene-hydrochlorothiazide (MAXZIDE-25) 37.5-25 MG per tablet, Take 1 tablet by mouth daily. Take 1  tablet  By mouth daily, Disp: 90 tablet, Rfl: 3;  Zinc-Magnesium Aspart-Vit B6 (ZINC MAGNESIUM ASPARTATE) 150-3.83-10 MG CAPS, Take 1 tablet by mouth daily., Disp: , Rfl: ;  metoprolol succinate (TOPROL-XL) 25 MG 24 hr tablet, TAKE ONE (1) TABLET BY MOUTH EVERY DAY, Disp: 90 tablet, Rfl: 0  BP 143/81  Pulse 63  Ht 5' (1.524 m)  Wt 135 lb (61.236 kg)  BMI 26.37 kg/m2  SpO2 100%  Body mass index is 26.37 kg/(m^2).             Review of Systems denies chest pain, dyspnea on exertion, PND, orthopnea, hemoptysis. All systems negative the complete review of systems    Objective:   Physical Exam BP 143/81  Pulse 63  Ht 5' (1.524 m)  Wt 135 lb (61.236 kg)   BMI 26.37 kg/m2  SpO2 100%  Gen.-alert and oriented x3 in no apparent distress HEENT normal for age Lungs no rhonchi or wheezing Cardiovascular regular rhythm no murmurs carotid pulses 3+ palpable no bruits audible Abdomen soft nontender no palpable masses Musculoskeletal free of  major deformities Skin clear -no rashes Neurologic normal Lower extremities 3+ femoral and dorsalis pedis pulses palpable bilaterally with no edema  Data ordered a carotid duplex exam which are reviewed and interpreted. There is no significant internal carotid flow reduction bilaterally. There is some mild right external carotid stenosis. There is minimal calcific plaque in the proximal internal carotid arteries bilaterally.       Assessment:     No evidence of significant internal carotid flow reduction and minimal calcified atheroma in the cervical carotid arteries-asymptomatic    Plan:     No need for further evaluation of carotid arteries as patient has minimal plaque formation This does not exclude calcific plaque intracranially the patient is having no symptoms and no treatment would be indicated at this time and this is out of realm  of peripheral vascular surgery We'll see patient back on when necessary basis

## 2013-10-09 ENCOUNTER — Other Ambulatory Visit: Payer: Self-pay | Admitting: Pulmonary Disease

## 2013-10-17 DIAGNOSIS — L819 Disorder of pigmentation, unspecified: Secondary | ICD-10-CM | POA: Diagnosis not present

## 2013-10-17 DIAGNOSIS — L820 Inflamed seborrheic keratosis: Secondary | ICD-10-CM | POA: Diagnosis not present

## 2013-10-17 DIAGNOSIS — L570 Actinic keratosis: Secondary | ICD-10-CM | POA: Diagnosis not present

## 2013-10-17 DIAGNOSIS — L821 Other seborrheic keratosis: Secondary | ICD-10-CM | POA: Diagnosis not present

## 2013-10-17 DIAGNOSIS — Z85828 Personal history of other malignant neoplasm of skin: Secondary | ICD-10-CM | POA: Diagnosis not present

## 2013-10-17 DIAGNOSIS — Z85820 Personal history of malignant melanoma of skin: Secondary | ICD-10-CM | POA: Diagnosis not present

## 2013-10-21 ENCOUNTER — Ambulatory Visit (INDEPENDENT_AMBULATORY_CARE_PROVIDER_SITE_OTHER): Payer: Medicare Other | Admitting: Cardiovascular Disease

## 2013-10-21 ENCOUNTER — Encounter: Payer: Self-pay | Admitting: Cardiovascular Disease

## 2013-10-21 VITALS — BP 120/80 | HR 53 | Ht 60.0 in | Wt 134.4 lb

## 2013-10-21 DIAGNOSIS — I6529 Occlusion and stenosis of unspecified carotid artery: Secondary | ICD-10-CM

## 2013-10-21 NOTE — Progress Notes (Signed)
[name] Date of Birth  Sep 09, 1945       [facility] [address], [address], [place], [place]  [address]   [place], [place]  [address] [place]   Fax  [phone]     Fax [phone]  Problem List:  1. Mild carotid artery disease 2  History of Present Illness:  Barbarajean is a 68 yo who I have seen in the past for elevated cholesterol for years.  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  She was told to see a cardiologist  She walks 4-5 times a week ( 3 miles at a time)  without chest pain or shortness breath. She denies any syncope or presyncopal episodes.   She was seen in consultation by Victorino Dike and her carotid duplex was negative for obstructive disease.  She has mild bilateral carotid artery disease. She has also seen Dr . Rita Ohara.   She works part time for [employer].   Current Outpatient Prescriptions on File Prior to Visit  Medication Sig Dispense Refill  . aspirin 81 MG tablet Take 81 mg by mouth daily.      . Cholecalciferol (VITAMIN D) 2000 UNITS CAPS Take 1 capsule by mouth daily.      . Coenzyme Q10 (COQ10) 200 MG CAPS Take 1 capsule by mouth daily.      Marland Kitchen DIGEST ENZYMES-ANTICHOLINERGIC PO Take 1 capsule by mouth 3 (three) times daily with meals.      Marland Kitchen ibuprofen (ADVIL,MOTRIN) 200 MG tablet Take 200 mg by mouth every 6 (six) hours as needed.      . metoprolol succinate (TOPROL-XL) 25 MG 24 hr tablet TAKE ONE (1) TABLET BY MOUTH EVERY DAY  90 tablet  0  . Multiple Vitamins-Minerals (MULTIVITAMIN & MINERAL PO) Take 1 tablet by mouth daily.      . Omega-3 Fatty Acids (THE VERY FINEST FISH OIL) LIQD Take 1 1/2 tbsp daily      . Probiotic Product (PROBIOTIC ACIDOPHILUS PO) Take 1 capsule by mouth daily.      . Red Yeast Rice 600 MG CAPS Take 2 capsules by mouth daily.      . TEKTURNA 150 MG  tablet TAKE 1 TABLET BY MOUTH DAILY  90 tablet  0  . thiamine (VITAMIN B-1) 100 MG tablet Take 100 mg by mouth daily.      Marland Kitchen triamterene-hydrochlorothiazide (MAXZIDE-25) 37.5-25 MG per tablet Take 1 tablet by mouth daily. Take 1  tablet  By mouth daily  90 tablet  3   No current facility-administered medications on file prior to visit.    Allergies  Allergen Reactions  . Codeine     REACTION: nausea  . Lisinopril     REACTION: INTOL to Prinivil w/ pain on urination  . Pravastatin Sodium     REACTION: INTOL to Pravachol w/ myalgias    Past Medical History  Diagnosis Date  . B12 deficiency   . Vitreous hemorrhage   . Hypertension   . Atypical chest pain   . Hypercholesteremia   . GERD (gastroesophageal reflux disease)   . Diverticulosis of colon   . IBS (irritable bowel syndrome)   . Cervical spondylosis without myelopathy   . Dysesthesia   . Anxiety   . Malignant melanoma     Past Surgical History  Procedure Laterality Date  . Cesarean section    . Arthroscopic shoulder surgery    . Melanoma surgery from ant neck region  1995  . Anterior cervical discectomy and fusion  1996    Dr. Sherwood Gambler  . Skin cancer removed from nose      History  Smoking status  . Never Smoker   Smokeless tobacco  . Not on file    History  Alcohol Use No    Family History  Problem Relation Age of Onset  . Diabetes Mother   . Heart disease Mother   . Hyperlipidemia Mother   . Hypertension Mother   . Cancer Father   . Deep vein thrombosis Father   . Heart attack Father   . Cancer Brother     Reviw of Systems:  Reviewed in the HPI.  All other systems are negative.  Physical Exam: Blood pressure 120/80, pulse 53, height 5' (1.524 m), weight 134 lb 6.4 oz (60.963 kg). Wt Readings from Last 3 Encounters:  10/21/13 134 lb 6.4 oz (60.963 kg)  09/02/13 135 lb (61.236 kg)  04/22/13 139 lb (63.05 kg)     General: Well developed, well nourished, in no acute distress.  Head:  Normocephalic, atraumatic, sclera non-icteric, mucus membranes are moist,   Neck: Supple. Carotids are 2 + without bruits. No JVD   Lungs: Clear   Heart: RR, normal S1S2  Abdomen: Soft, non-tender, non-distended with normal bowel sounds.  Msk:  Strength and tone are normal   Extremities: No clubbing or cyanosis. No edema.  Distal pedal pulses are 2+ and equal    Neuro: CN II - XII intact.  Alert and oriented X 3.   Psych:  Normal   ECG: October 21, 2013:  Sinus brady at 74. Otherwise no significant abnormalities.  Assessment / Plan:

## 2013-10-21 NOTE — Patient Instructions (Signed)
Your physician recommends that you schedule a follow-up appointment in: as needed with Dr. Acie Fredrickson

## 2013-10-21 NOTE — Assessment & Plan Note (Signed)
Wendy [name]  presents today for visit. I've seen her many years ago. She was seen her doctor for her TMJ and was incidentally noted to have some carotid plaque buildup by CXR. She was told to see her cardiologist.  She's not having any cardiac symptoms. She walks 3 miles a day-5 days a week without symptoms. She has no chest pain or shortness breath. She has no dizziness, syncope, or presyncope. She has seen Dr. Victorino Dike and had a carotid duplex scan which revealed only mild bilateral plaque. She's also seen her neurosurgeon-Dr. Sherwood Gambler.  At this point she seems to be very stable. She may indeed have a very small amount of plaque and she has had hyperlipidemia for years. She does not tolerate any of the statin medications.  We discussed the possibility of her from her to lipid clinic. She wants to think about it but may wish year later in the year.  She'll continue to followup with Dr. Mertha Finders for primary care

## 2013-11-04 DIAGNOSIS — N8189 Other female genital prolapse: Secondary | ICD-10-CM | POA: Diagnosis not present

## 2013-11-04 DIAGNOSIS — N3080 Other cystitis without hematuria: Secondary | ICD-10-CM | POA: Diagnosis not present

## 2013-11-04 DIAGNOSIS — N3000 Acute cystitis without hematuria: Secondary | ICD-10-CM | POA: Diagnosis not present

## 2013-11-10 DIAGNOSIS — N3020 Other chronic cystitis without hematuria: Secondary | ICD-10-CM | POA: Diagnosis not present

## 2013-11-27 ENCOUNTER — Other Ambulatory Visit: Payer: Self-pay | Admitting: Dermatology

## 2013-11-27 DIAGNOSIS — Z85820 Personal history of malignant melanoma of skin: Secondary | ICD-10-CM | POA: Diagnosis not present

## 2013-11-27 DIAGNOSIS — L570 Actinic keratosis: Secondary | ICD-10-CM | POA: Diagnosis not present

## 2013-11-27 DIAGNOSIS — D0439 Carcinoma in situ of skin of other parts of face: Secondary | ICD-10-CM | POA: Diagnosis not present

## 2013-11-27 DIAGNOSIS — D0339 Melanoma in situ of other parts of face: Secondary | ICD-10-CM | POA: Diagnosis not present

## 2013-11-27 DIAGNOSIS — Z85828 Personal history of other malignant neoplasm of skin: Secondary | ICD-10-CM | POA: Diagnosis not present

## 2013-12-04 DIAGNOSIS — C4339 Malignant melanoma of other parts of face: Secondary | ICD-10-CM | POA: Diagnosis not present

## 2013-12-04 DIAGNOSIS — Z85828 Personal history of other malignant neoplasm of skin: Secondary | ICD-10-CM | POA: Diagnosis not present

## 2013-12-04 DIAGNOSIS — Z85820 Personal history of malignant melanoma of skin: Secondary | ICD-10-CM | POA: Diagnosis not present

## 2013-12-04 DIAGNOSIS — D0339 Melanoma in situ of other parts of face: Secondary | ICD-10-CM | POA: Diagnosis not present

## 2013-12-08 ENCOUNTER — Other Ambulatory Visit: Payer: Self-pay | Admitting: Dermatology

## 2013-12-08 DIAGNOSIS — L859 Epidermal thickening, unspecified: Secondary | ICD-10-CM | POA: Diagnosis not present

## 2013-12-08 DIAGNOSIS — Z85828 Personal history of other malignant neoplasm of skin: Secondary | ICD-10-CM | POA: Diagnosis not present

## 2013-12-08 DIAGNOSIS — Z85820 Personal history of malignant melanoma of skin: Secondary | ICD-10-CM | POA: Diagnosis not present

## 2013-12-08 DIAGNOSIS — D0339 Melanoma in situ of other parts of face: Secondary | ICD-10-CM | POA: Diagnosis not present

## 2013-12-09 DIAGNOSIS — H6123 Impacted cerumen, bilateral: Secondary | ICD-10-CM | POA: Diagnosis not present

## 2013-12-09 DIAGNOSIS — M2660 Temporomandibular joint disorder, unspecified: Secondary | ICD-10-CM | POA: Diagnosis not present

## 2013-12-11 DIAGNOSIS — D0339 Melanoma in situ of other parts of face: Secondary | ICD-10-CM | POA: Diagnosis not present

## 2013-12-11 DIAGNOSIS — T148 Other injury of unspecified body region: Secondary | ICD-10-CM | POA: Diagnosis not present

## 2013-12-11 DIAGNOSIS — Z859 Personal history of malignant neoplasm, unspecified: Secondary | ICD-10-CM | POA: Diagnosis not present

## 2013-12-11 DIAGNOSIS — L7621 Postprocedural hemorrhage and hematoma of skin and subcutaneous tissue following a dermatologic procedure: Secondary | ICD-10-CM | POA: Diagnosis not present

## 2013-12-11 DIAGNOSIS — Z9889 Other specified postprocedural states: Secondary | ICD-10-CM | POA: Diagnosis not present

## 2013-12-11 DIAGNOSIS — K130 Diseases of lips: Secondary | ICD-10-CM | POA: Diagnosis not present

## 2013-12-11 DIAGNOSIS — I10 Essential (primary) hypertension: Secondary | ICD-10-CM | POA: Diagnosis not present

## 2013-12-11 DIAGNOSIS — R58 Hemorrhage, not elsewhere classified: Secondary | ICD-10-CM | POA: Diagnosis not present

## 2013-12-11 DIAGNOSIS — L7622 Postprocedural hemorrhage and hematoma of skin and subcutaneous tissue following other procedure: Secondary | ICD-10-CM | POA: Diagnosis not present

## 2013-12-11 DIAGNOSIS — D030 Melanoma in situ of lip: Secondary | ICD-10-CM | POA: Diagnosis not present

## 2013-12-11 DIAGNOSIS — T8189XA Other complications of procedures, not elsewhere classified, initial encounter: Secondary | ICD-10-CM | POA: Diagnosis not present

## 2013-12-11 DIAGNOSIS — D0330 Melanoma in situ of unspecified part of face: Secondary | ICD-10-CM | POA: Diagnosis not present

## 2013-12-11 DIAGNOSIS — M952 Other acquired deformity of head: Secondary | ICD-10-CM | POA: Diagnosis not present

## 2013-12-12 DIAGNOSIS — L7621 Postprocedural hemorrhage and hematoma of skin and subcutaneous tissue following a dermatologic procedure: Secondary | ICD-10-CM | POA: Diagnosis not present

## 2013-12-12 DIAGNOSIS — Z859 Personal history of malignant neoplasm, unspecified: Secondary | ICD-10-CM | POA: Diagnosis not present

## 2013-12-12 DIAGNOSIS — Z9889 Other specified postprocedural states: Secondary | ICD-10-CM | POA: Diagnosis not present

## 2013-12-12 DIAGNOSIS — I10 Essential (primary) hypertension: Secondary | ICD-10-CM | POA: Diagnosis not present

## 2014-01-12 ENCOUNTER — Other Ambulatory Visit: Payer: Self-pay | Admitting: Pulmonary Disease

## 2014-01-12 DIAGNOSIS — H2513 Age-related nuclear cataract, bilateral: Secondary | ICD-10-CM | POA: Diagnosis not present

## 2014-01-20 DIAGNOSIS — L2089 Other atopic dermatitis: Secondary | ICD-10-CM | POA: Diagnosis not present

## 2014-01-20 DIAGNOSIS — Z85828 Personal history of other malignant neoplasm of skin: Secondary | ICD-10-CM | POA: Diagnosis not present

## 2014-01-20 DIAGNOSIS — L570 Actinic keratosis: Secondary | ICD-10-CM | POA: Diagnosis not present

## 2014-01-20 DIAGNOSIS — D225 Melanocytic nevi of trunk: Secondary | ICD-10-CM | POA: Diagnosis not present

## 2014-01-20 DIAGNOSIS — L820 Inflamed seborrheic keratosis: Secondary | ICD-10-CM | POA: Diagnosis not present

## 2014-01-20 DIAGNOSIS — Z85820 Personal history of malignant melanoma of skin: Secondary | ICD-10-CM | POA: Diagnosis not present

## 2014-01-20 DIAGNOSIS — L814 Other melanin hyperpigmentation: Secondary | ICD-10-CM | POA: Diagnosis not present

## 2014-01-20 DIAGNOSIS — D1801 Hemangioma of skin and subcutaneous tissue: Secondary | ICD-10-CM | POA: Diagnosis not present

## 2014-01-24 DIAGNOSIS — Z23 Encounter for immunization: Secondary | ICD-10-CM | POA: Diagnosis not present

## 2014-01-30 DIAGNOSIS — Z9889 Other specified postprocedural states: Secondary | ICD-10-CM | POA: Diagnosis not present

## 2014-03-13 DIAGNOSIS — D0330 Melanoma in situ of unspecified part of face: Secondary | ICD-10-CM | POA: Diagnosis not present

## 2014-03-13 DIAGNOSIS — L905 Scar conditions and fibrosis of skin: Secondary | ICD-10-CM | POA: Diagnosis not present

## 2014-03-13 DIAGNOSIS — Z9889 Other specified postprocedural states: Secondary | ICD-10-CM | POA: Diagnosis not present

## 2014-03-13 DIAGNOSIS — M952 Other acquired deformity of head: Secondary | ICD-10-CM | POA: Diagnosis not present

## 2014-03-27 DIAGNOSIS — L570 Actinic keratosis: Secondary | ICD-10-CM | POA: Diagnosis not present

## 2014-03-27 DIAGNOSIS — L820 Inflamed seborrheic keratosis: Secondary | ICD-10-CM | POA: Diagnosis not present

## 2014-03-27 DIAGNOSIS — L821 Other seborrheic keratosis: Secondary | ICD-10-CM | POA: Diagnosis not present

## 2014-03-27 DIAGNOSIS — Z85820 Personal history of malignant melanoma of skin: Secondary | ICD-10-CM | POA: Diagnosis not present

## 2014-03-27 DIAGNOSIS — Z85828 Personal history of other malignant neoplasm of skin: Secondary | ICD-10-CM | POA: Diagnosis not present

## 2014-04-21 DIAGNOSIS — L905 Scar conditions and fibrosis of skin: Secondary | ICD-10-CM | POA: Diagnosis not present

## 2014-04-21 DIAGNOSIS — D0330 Melanoma in situ of unspecified part of face: Secondary | ICD-10-CM | POA: Diagnosis not present

## 2014-04-21 DIAGNOSIS — Z681 Body mass index (BMI) 19 or less, adult: Secondary | ICD-10-CM | POA: Diagnosis not present

## 2014-04-21 DIAGNOSIS — Z8589 Personal history of malignant neoplasm of other organs and systems: Secondary | ICD-10-CM | POA: Diagnosis not present

## 2014-04-28 ENCOUNTER — Other Ambulatory Visit: Payer: Self-pay | Admitting: Internal Medicine

## 2014-04-28 ENCOUNTER — Ambulatory Visit
Admission: RE | Admit: 2014-04-28 | Discharge: 2014-04-28 | Disposition: A | Discharge status: Home / Self Care | Payer: Medicare Other | Source: Ambulatory Visit | Attending: Internal Medicine | Admitting: Internal Medicine

## 2014-04-28 DIAGNOSIS — Z23 Encounter for immunization: Secondary | ICD-10-CM | POA: Diagnosis not present

## 2014-04-28 DIAGNOSIS — C439 Malignant melanoma of skin, unspecified: Secondary | ICD-10-CM | POA: Diagnosis not present

## 2014-04-28 DIAGNOSIS — E559 Vitamin D deficiency, unspecified: Secondary | ICD-10-CM | POA: Diagnosis not present

## 2014-04-28 DIAGNOSIS — G479 Sleep disorder, unspecified: Secondary | ICD-10-CM | POA: Diagnosis not present

## 2014-04-28 DIAGNOSIS — Z85820 Personal history of malignant melanoma of skin: Secondary | ICD-10-CM | POA: Diagnosis not present

## 2014-04-28 DIAGNOSIS — M2669 Other specified disorders of temporomandibular joint: Secondary | ICD-10-CM | POA: Diagnosis not present

## 2014-04-28 DIAGNOSIS — E780 Pure hypercholesterolemia: Secondary | ICD-10-CM | POA: Diagnosis not present

## 2014-04-28 DIAGNOSIS — N390 Urinary tract infection, site not specified: Secondary | ICD-10-CM | POA: Diagnosis not present

## 2014-04-28 DIAGNOSIS — N8110 Cystocele, unspecified: Secondary | ICD-10-CM | POA: Diagnosis not present

## 2014-04-28 DIAGNOSIS — I10 Essential (primary) hypertension: Secondary | ICD-10-CM | POA: Diagnosis not present

## 2014-04-28 DIAGNOSIS — Z0001 Encounter for general adult medical examination with abnormal findings: Secondary | ICD-10-CM | POA: Diagnosis not present

## 2014-04-28 DIAGNOSIS — K649 Unspecified hemorrhoids: Secondary | ICD-10-CM | POA: Diagnosis not present

## 2014-05-12 DIAGNOSIS — M2660 Temporomandibular joint disorder, unspecified: Secondary | ICD-10-CM | POA: Diagnosis not present

## 2014-05-12 DIAGNOSIS — H9041 Sensorineural hearing loss, unilateral, right ear, with unrestricted hearing on the contralateral side: Secondary | ICD-10-CM | POA: Diagnosis not present

## 2014-05-12 DIAGNOSIS — H6123 Impacted cerumen, bilateral: Secondary | ICD-10-CM | POA: Diagnosis not present

## 2014-05-12 DIAGNOSIS — H833X1 Noise effects on right inner ear: Secondary | ICD-10-CM | POA: Diagnosis not present

## 2014-05-19 DIAGNOSIS — H833X1 Noise effects on right inner ear: Secondary | ICD-10-CM | POA: Diagnosis not present

## 2014-05-19 DIAGNOSIS — H9041 Sensorineural hearing loss, unilateral, right ear, with unrestricted hearing on the contralateral side: Secondary | ICD-10-CM | POA: Diagnosis not present

## 2014-06-01 ENCOUNTER — Other Ambulatory Visit: Payer: Self-pay

## 2014-06-01 DIAGNOSIS — Z1231 Encounter for screening mammogram for malignant neoplasm of breast: Secondary | ICD-10-CM

## 2014-06-03 DIAGNOSIS — M2660 Temporomandibular joint disorder, unspecified: Secondary | ICD-10-CM | POA: Diagnosis not present

## 2014-06-03 DIAGNOSIS — H833X1 Noise effects on right inner ear: Secondary | ICD-10-CM | POA: Diagnosis not present

## 2014-06-03 DIAGNOSIS — H9041 Sensorineural hearing loss, unilateral, right ear, with unrestricted hearing on the contralateral side: Secondary | ICD-10-CM | POA: Diagnosis not present

## 2014-06-11 DIAGNOSIS — E871 Hypo-osmolality and hyponatremia: Secondary | ICD-10-CM | POA: Diagnosis not present

## 2014-06-11 DIAGNOSIS — R1032 Left lower quadrant pain: Secondary | ICD-10-CM | POA: Diagnosis not present

## 2014-06-11 DIAGNOSIS — N390 Urinary tract infection, site not specified: Secondary | ICD-10-CM | POA: Diagnosis not present

## 2014-06-11 DIAGNOSIS — E878 Other disorders of electrolyte and fluid balance, not elsewhere classified: Secondary | ICD-10-CM | POA: Diagnosis not present

## 2014-06-11 DIAGNOSIS — I10 Essential (primary) hypertension: Secondary | ICD-10-CM | POA: Diagnosis not present

## 2014-06-16 DIAGNOSIS — M952 Other acquired deformity of head: Secondary | ICD-10-CM | POA: Diagnosis not present

## 2014-06-16 DIAGNOSIS — Z8589 Personal history of malignant neoplasm of other organs and systems: Secondary | ICD-10-CM | POA: Diagnosis not present

## 2014-06-19 ENCOUNTER — Ambulatory Visit: Payer: Medicare Other

## 2014-06-27 DIAGNOSIS — R05 Cough: Secondary | ICD-10-CM | POA: Diagnosis not present

## 2014-06-27 DIAGNOSIS — B349 Viral infection, unspecified: Secondary | ICD-10-CM | POA: Diagnosis not present

## 2014-06-27 DIAGNOSIS — R112 Nausea with vomiting, unspecified: Secondary | ICD-10-CM | POA: Diagnosis not present

## 2014-07-07 ENCOUNTER — Ambulatory Visit
Admission: RE | Admit: 2014-07-07 | Discharge: 2014-07-07 | Disposition: A | Discharge status: Home / Self Care | Payer: Medicare Other | Source: Ambulatory Visit

## 2014-07-07 DIAGNOSIS — Z1231 Encounter for screening mammogram for malignant neoplasm of breast: Secondary | ICD-10-CM

## 2014-07-08 ENCOUNTER — Other Ambulatory Visit: Payer: Self-pay | Admitting: Obstetrics & Gynecology

## 2014-07-08 DIAGNOSIS — R928 Other abnormal and inconclusive findings on diagnostic imaging of breast: Secondary | ICD-10-CM

## 2014-07-13 ENCOUNTER — Ambulatory Visit
Admission: RE | Admit: 2014-07-13 | Discharge: 2014-07-13 | Disposition: A | Discharge status: Home / Self Care | Payer: Medicare Other | Source: Ambulatory Visit | Attending: Obstetrics & Gynecology | Admitting: Obstetrics & Gynecology

## 2014-07-13 DIAGNOSIS — R928 Other abnormal and inconclusive findings on diagnostic imaging of breast: Secondary | ICD-10-CM

## 2014-07-13 DIAGNOSIS — N63 Unspecified lump in breast: Secondary | ICD-10-CM | POA: Diagnosis not present

## 2014-07-19 ENCOUNTER — Other Ambulatory Visit: Payer: Self-pay | Admitting: Pulmonary Disease

## 2014-07-20 ENCOUNTER — Other Ambulatory Visit: Payer: Self-pay | Admitting: Obstetrics & Gynecology

## 2014-07-20 DIAGNOSIS — Z124 Encounter for screening for malignant neoplasm of cervix: Secondary | ICD-10-CM | POA: Diagnosis not present

## 2014-07-21 ENCOUNTER — Other Ambulatory Visit: Payer: Self-pay | Admitting: Pulmonary Disease

## 2014-07-21 DIAGNOSIS — D2272 Melanocytic nevi of left lower limb, including hip: Secondary | ICD-10-CM | POA: Diagnosis not present

## 2014-07-21 DIAGNOSIS — D2271 Melanocytic nevi of right lower limb, including hip: Secondary | ICD-10-CM | POA: Diagnosis not present

## 2014-07-21 DIAGNOSIS — L821 Other seborrheic keratosis: Secondary | ICD-10-CM | POA: Diagnosis not present

## 2014-07-21 DIAGNOSIS — L814 Other melanin hyperpigmentation: Secondary | ICD-10-CM | POA: Diagnosis not present

## 2014-07-21 DIAGNOSIS — Z85820 Personal history of malignant melanoma of skin: Secondary | ICD-10-CM | POA: Diagnosis not present

## 2014-07-21 DIAGNOSIS — D485 Neoplasm of uncertain behavior of skin: Secondary | ICD-10-CM | POA: Diagnosis not present

## 2014-07-21 DIAGNOSIS — D1801 Hemangioma of skin and subcutaneous tissue: Secondary | ICD-10-CM | POA: Diagnosis not present

## 2014-07-21 DIAGNOSIS — D2261 Melanocytic nevi of right upper limb, including shoulder: Secondary | ICD-10-CM | POA: Diagnosis not present

## 2014-07-21 DIAGNOSIS — D2262 Melanocytic nevi of left upper limb, including shoulder: Secondary | ICD-10-CM | POA: Diagnosis not present

## 2014-07-21 DIAGNOSIS — D225 Melanocytic nevi of trunk: Secondary | ICD-10-CM | POA: Diagnosis not present

## 2014-07-21 DIAGNOSIS — L570 Actinic keratosis: Secondary | ICD-10-CM | POA: Diagnosis not present

## 2014-07-21 DIAGNOSIS — Z85828 Personal history of other malignant neoplasm of skin: Secondary | ICD-10-CM | POA: Diagnosis not present

## 2014-07-21 LAB — CYTOLOGY - PAP

## 2014-08-06 DIAGNOSIS — M2660 Temporomandibular joint disorder, unspecified: Secondary | ICD-10-CM | POA: Diagnosis not present

## 2014-08-06 DIAGNOSIS — H9041 Sensorineural hearing loss, unilateral, right ear, with unrestricted hearing on the contralateral side: Secondary | ICD-10-CM | POA: Diagnosis not present

## 2014-08-06 DIAGNOSIS — H833X1 Noise effects on right inner ear: Secondary | ICD-10-CM | POA: Diagnosis not present

## 2014-08-13 DIAGNOSIS — I10 Essential (primary) hypertension: Secondary | ICD-10-CM | POA: Diagnosis not present

## 2014-08-13 DIAGNOSIS — E871 Hypo-osmolality and hyponatremia: Secondary | ICD-10-CM | POA: Diagnosis not present

## 2014-08-13 DIAGNOSIS — M47812 Spondylosis without myelopathy or radiculopathy, cervical region: Secondary | ICD-10-CM | POA: Diagnosis not present

## 2014-08-17 ENCOUNTER — Other Ambulatory Visit: Payer: Self-pay | Admitting: Pulmonary Disease

## 2014-08-27 DIAGNOSIS — M5030 Other cervical disc degeneration, unspecified cervical region: Secondary | ICD-10-CM | POA: Diagnosis not present

## 2014-08-27 DIAGNOSIS — M545 Low back pain: Secondary | ICD-10-CM | POA: Diagnosis not present

## 2014-09-04 DIAGNOSIS — E871 Hypo-osmolality and hyponatremia: Secondary | ICD-10-CM | POA: Diagnosis not present

## 2014-09-04 DIAGNOSIS — I10 Essential (primary) hypertension: Secondary | ICD-10-CM | POA: Diagnosis not present

## 2014-09-23 DIAGNOSIS — N813 Complete uterovaginal prolapse: Secondary | ICD-10-CM | POA: Diagnosis not present

## 2014-09-23 DIAGNOSIS — N8110 Cystocele, unspecified: Secondary | ICD-10-CM | POA: Diagnosis not present

## 2014-09-23 DIAGNOSIS — K642 Third degree hemorrhoids: Secondary | ICD-10-CM | POA: Diagnosis not present

## 2014-10-12 DIAGNOSIS — H9041 Sensorineural hearing loss, unilateral, right ear, with unrestricted hearing on the contralateral side: Secondary | ICD-10-CM | POA: Diagnosis not present

## 2014-10-12 DIAGNOSIS — H6123 Impacted cerumen, bilateral: Secondary | ICD-10-CM | POA: Diagnosis not present

## 2014-10-12 DIAGNOSIS — K649 Unspecified hemorrhoids: Secondary | ICD-10-CM | POA: Diagnosis not present

## 2014-10-12 DIAGNOSIS — M2660 Temporomandibular joint disorder, unspecified: Secondary | ICD-10-CM | POA: Diagnosis not present

## 2014-10-12 DIAGNOSIS — I10 Essential (primary) hypertension: Secondary | ICD-10-CM | POA: Diagnosis not present

## 2014-10-12 DIAGNOSIS — H833X1 Noise effects on right inner ear: Secondary | ICD-10-CM | POA: Diagnosis not present

## 2014-10-12 DIAGNOSIS — E871 Hypo-osmolality and hyponatremia: Secondary | ICD-10-CM | POA: Diagnosis not present

## 2014-10-13 DIAGNOSIS — K642 Third degree hemorrhoids: Secondary | ICD-10-CM | POA: Diagnosis not present

## 2014-10-28 DIAGNOSIS — N813 Complete uterovaginal prolapse: Secondary | ICD-10-CM | POA: Diagnosis not present

## 2014-10-28 DIAGNOSIS — R8290 Unspecified abnormal findings in urine: Secondary | ICD-10-CM | POA: Diagnosis not present

## 2014-10-28 DIAGNOSIS — Z09 Encounter for follow-up examination after completed treatment for conditions other than malignant neoplasm: Secondary | ICD-10-CM | POA: Diagnosis not present

## 2014-10-28 DIAGNOSIS — R339 Retention of urine, unspecified: Secondary | ICD-10-CM | POA: Diagnosis not present

## 2014-11-09 ENCOUNTER — Encounter: Payer: Self-pay | Admitting: Gastroenterology

## 2014-11-23 DIAGNOSIS — R399 Unspecified symptoms and signs involving the genitourinary system: Secondary | ICD-10-CM | POA: Diagnosis not present

## 2014-12-07 ENCOUNTER — Other Ambulatory Visit: Payer: Self-pay | Admitting: Obstetrics & Gynecology

## 2014-12-07 DIAGNOSIS — N6489 Other specified disorders of breast: Secondary | ICD-10-CM

## 2014-12-11 DIAGNOSIS — N3001 Acute cystitis with hematuria: Secondary | ICD-10-CM | POA: Diagnosis not present

## 2014-12-11 DIAGNOSIS — N813 Complete uterovaginal prolapse: Secondary | ICD-10-CM | POA: Diagnosis not present

## 2014-12-11 DIAGNOSIS — N393 Stress incontinence (female) (male): Secondary | ICD-10-CM | POA: Diagnosis not present

## 2014-12-11 DIAGNOSIS — N390 Urinary tract infection, site not specified: Secondary | ICD-10-CM | POA: Diagnosis not present

## 2014-12-11 DIAGNOSIS — N814 Uterovaginal prolapse, unspecified: Secondary | ICD-10-CM | POA: Diagnosis not present

## 2014-12-11 DIAGNOSIS — N398 Other specified disorders of urinary system: Secondary | ICD-10-CM | POA: Insufficient documentation

## 2014-12-11 DIAGNOSIS — Z7982 Long term (current) use of aspirin: Secondary | ICD-10-CM | POA: Diagnosis not present

## 2014-12-11 DIAGNOSIS — R39198 Other difficulties with micturition: Secondary | ICD-10-CM | POA: Diagnosis not present

## 2014-12-12 DIAGNOSIS — R001 Bradycardia, unspecified: Secondary | ICD-10-CM | POA: Diagnosis not present

## 2014-12-15 DIAGNOSIS — N3000 Acute cystitis without hematuria: Secondary | ICD-10-CM | POA: Diagnosis not present

## 2014-12-18 DIAGNOSIS — L905 Scar conditions and fibrosis of skin: Secondary | ICD-10-CM | POA: Diagnosis not present

## 2014-12-18 DIAGNOSIS — M952 Other acquired deformity of head: Secondary | ICD-10-CM | POA: Diagnosis not present

## 2014-12-18 DIAGNOSIS — Z8589 Personal history of malignant neoplasm of other organs and systems: Secondary | ICD-10-CM | POA: Diagnosis not present

## 2014-12-18 DIAGNOSIS — L578 Other skin changes due to chronic exposure to nonionizing radiation: Secondary | ICD-10-CM | POA: Diagnosis not present

## 2014-12-21 DIAGNOSIS — N813 Complete uterovaginal prolapse: Secondary | ICD-10-CM | POA: Diagnosis not present

## 2014-12-21 DIAGNOSIS — Q513 Bicornate uterus: Secondary | ICD-10-CM | POA: Diagnosis not present

## 2014-12-21 DIAGNOSIS — Q512 Other doubling of uterus: Secondary | ICD-10-CM | POA: Diagnosis not present

## 2014-12-21 DIAGNOSIS — N393 Stress incontinence (female) (male): Secondary | ICD-10-CM | POA: Diagnosis not present

## 2014-12-21 DIAGNOSIS — N72 Inflammatory disease of cervix uteri: Secondary | ICD-10-CM | POA: Diagnosis not present

## 2014-12-22 DIAGNOSIS — K913 Postprocedural intestinal obstruction: Secondary | ICD-10-CM | POA: Diagnosis not present

## 2014-12-22 DIAGNOSIS — Q512 Other doubling of uterus: Secondary | ICD-10-CM | POA: Diagnosis not present

## 2014-12-22 DIAGNOSIS — Z87440 Personal history of urinary (tract) infections: Secondary | ICD-10-CM | POA: Diagnosis not present

## 2014-12-22 DIAGNOSIS — I10 Essential (primary) hypertension: Secondary | ICD-10-CM | POA: Diagnosis present

## 2014-12-22 DIAGNOSIS — R339 Retention of urine, unspecified: Secondary | ICD-10-CM | POA: Diagnosis not present

## 2014-12-22 DIAGNOSIS — R338 Other retention of urine: Secondary | ICD-10-CM | POA: Diagnosis not present

## 2014-12-22 DIAGNOSIS — N814 Uterovaginal prolapse, unspecified: Secondary | ICD-10-CM | POA: Diagnosis not present

## 2014-12-22 DIAGNOSIS — D259 Leiomyoma of uterus, unspecified: Secondary | ICD-10-CM | POA: Diagnosis present

## 2014-12-22 DIAGNOSIS — R935 Abnormal findings on diagnostic imaging of other abdominal regions, including retroperitoneum: Secondary | ICD-10-CM | POA: Diagnosis not present

## 2014-12-22 DIAGNOSIS — I358 Other nonrheumatic aortic valve disorders: Secondary | ICD-10-CM | POA: Diagnosis not present

## 2014-12-22 DIAGNOSIS — K5900 Constipation, unspecified: Secondary | ICD-10-CM | POA: Diagnosis not present

## 2014-12-22 DIAGNOSIS — N813 Complete uterovaginal prolapse: Secondary | ICD-10-CM | POA: Diagnosis present

## 2014-12-22 DIAGNOSIS — Z85820 Personal history of malignant melanoma of skin: Secondary | ICD-10-CM | POA: Diagnosis not present

## 2014-12-22 DIAGNOSIS — E871 Hypo-osmolality and hyponatremia: Secondary | ICD-10-CM | POA: Diagnosis not present

## 2014-12-22 DIAGNOSIS — N393 Stress incontinence (female) (male): Secondary | ICD-10-CM | POA: Diagnosis present

## 2015-01-01 DIAGNOSIS — I10 Essential (primary) hypertension: Secondary | ICD-10-CM | POA: Diagnosis not present

## 2015-01-01 DIAGNOSIS — R339 Retention of urine, unspecified: Secondary | ICD-10-CM | POA: Diagnosis not present

## 2015-01-01 DIAGNOSIS — E871 Hypo-osmolality and hyponatremia: Secondary | ICD-10-CM | POA: Diagnosis not present

## 2015-01-01 DIAGNOSIS — N3001 Acute cystitis with hematuria: Secondary | ICD-10-CM | POA: Diagnosis not present

## 2015-01-05 ENCOUNTER — Ambulatory Visit
Admission: RE | Admit: 2015-01-05 | Discharge: 2015-01-05 | Disposition: A | Discharge status: Home / Self Care | Payer: Medicare Other | Source: Ambulatory Visit | Attending: Obstetrics & Gynecology | Admitting: Obstetrics & Gynecology

## 2015-01-05 DIAGNOSIS — N6489 Other specified disorders of breast: Secondary | ICD-10-CM

## 2015-01-05 DIAGNOSIS — R928 Other abnormal and inconclusive findings on diagnostic imaging of breast: Secondary | ICD-10-CM | POA: Diagnosis not present

## 2015-01-08 DIAGNOSIS — E871 Hypo-osmolality and hyponatremia: Secondary | ICD-10-CM | POA: Diagnosis not present

## 2015-01-08 DIAGNOSIS — Z23 Encounter for immunization: Secondary | ICD-10-CM | POA: Diagnosis not present

## 2015-01-08 DIAGNOSIS — I10 Essential (primary) hypertension: Secondary | ICD-10-CM | POA: Diagnosis not present

## 2015-01-14 DIAGNOSIS — D1801 Hemangioma of skin and subcutaneous tissue: Secondary | ICD-10-CM | POA: Diagnosis not present

## 2015-01-14 DIAGNOSIS — Z85820 Personal history of malignant melanoma of skin: Secondary | ICD-10-CM | POA: Diagnosis not present

## 2015-01-14 DIAGNOSIS — Z85828 Personal history of other malignant neoplasm of skin: Secondary | ICD-10-CM | POA: Diagnosis not present

## 2015-01-14 DIAGNOSIS — C44329 Squamous cell carcinoma of skin of other parts of face: Secondary | ICD-10-CM | POA: Diagnosis not present

## 2015-01-14 DIAGNOSIS — L821 Other seborrheic keratosis: Secondary | ICD-10-CM | POA: Diagnosis not present

## 2015-01-14 DIAGNOSIS — L570 Actinic keratosis: Secondary | ICD-10-CM | POA: Diagnosis not present

## 2015-01-14 DIAGNOSIS — D225 Melanocytic nevi of trunk: Secondary | ICD-10-CM | POA: Diagnosis not present

## 2015-01-14 DIAGNOSIS — L814 Other melanin hyperpigmentation: Secondary | ICD-10-CM | POA: Diagnosis not present

## 2015-01-16 ENCOUNTER — Emergency Department (HOSPITAL_COMMUNITY)
Admission: EM | Admit: 2015-01-16 | Discharge: 2015-01-16 | Disposition: A | Discharge status: Home / Self Care | Payer: Medicare Other | Attending: Emergency Medicine | Admitting: Emergency Medicine

## 2015-01-16 ENCOUNTER — Emergency Department (INDEPENDENT_AMBULATORY_CARE_PROVIDER_SITE_OTHER)
Admission: EM | Admit: 2015-01-16 | Discharge: 2015-01-16 | Disposition: A | Discharge status: Nursing Home (Includes ICF) | Payer: Medicare Other | Source: Home / Self Care | Attending: Emergency Medicine | Admitting: Emergency Medicine

## 2015-01-16 ENCOUNTER — Encounter (HOSPITAL_COMMUNITY): Payer: Self-pay | Admitting: Emergency Medicine

## 2015-01-16 ENCOUNTER — Encounter (HOSPITAL_COMMUNITY): Payer: Self-pay

## 2015-01-16 DIAGNOSIS — M47812 Spondylosis without myelopathy or radiculopathy, cervical region: Secondary | ICD-10-CM | POA: Insufficient documentation

## 2015-01-16 DIAGNOSIS — Z7982 Long term (current) use of aspirin: Secondary | ICD-10-CM | POA: Diagnosis not present

## 2015-01-16 DIAGNOSIS — Z85820 Personal history of malignant melanoma of skin: Secondary | ICD-10-CM | POA: Insufficient documentation

## 2015-01-16 DIAGNOSIS — I10 Essential (primary) hypertension: Secondary | ICD-10-CM

## 2015-01-16 DIAGNOSIS — E7800 Pure hypercholesterolemia, unspecified: Secondary | ICD-10-CM | POA: Insufficient documentation

## 2015-01-16 DIAGNOSIS — Z79899 Other long term (current) drug therapy: Secondary | ICD-10-CM | POA: Insufficient documentation

## 2015-01-16 DIAGNOSIS — E538 Deficiency of other specified B group vitamins: Secondary | ICD-10-CM | POA: Insufficient documentation

## 2015-01-16 DIAGNOSIS — Z8719 Personal history of other diseases of the digestive system: Secondary | ICD-10-CM | POA: Diagnosis not present

## 2015-01-16 DIAGNOSIS — Z8669 Personal history of other diseases of the nervous system and sense organs: Secondary | ICD-10-CM | POA: Diagnosis not present

## 2015-01-16 DIAGNOSIS — F419 Anxiety disorder, unspecified: Secondary | ICD-10-CM | POA: Insufficient documentation

## 2015-01-16 DIAGNOSIS — R51 Headache: Secondary | ICD-10-CM | POA: Diagnosis not present

## 2015-01-16 LAB — POCT I-STAT, CHEM 8
BUN: 14 mg/dL (ref 6–20)
CHLORIDE: 94 mmol/L — AB (ref 101–111)
Calcium, Ion: 1.16 mmol/L (ref 1.13–1.30)
Creatinine, Ser: 0.8 mg/dL (ref 0.44–1.00)
Glucose, Bld: 105 mg/dL — ABNORMAL HIGH (ref 65–99)
HCT: 44 % (ref 36.0–46.0)
Hemoglobin: 15 g/dL (ref 12.0–15.0)
Potassium: 3.6 mmol/L (ref 3.5–5.1)
SODIUM: 133 mmol/L — AB (ref 135–145)
TCO2: 25 mmol/L (ref 0–100)

## 2015-01-16 NOTE — ED Notes (Signed)
Pt sent here from [facility] for htn. Sent down here due to systolic in [id number]. Reports headache, denies other symptoms.

## 2015-01-16 NOTE — ED Notes (Signed)
The patient presented to the [facility] with a complaint of hypertension that has been ongoing recently however today she has not been able to get it down.

## 2015-01-16 NOTE — Discharge Instructions (Signed)
As we discussed, there is no evidence of hypertensive emergency today. Please increase your metoprolol to 50mg  daily. Please see Dr. Inda Merlin within one week for blood pressure re-check. Return to the ED for new or worsening symptoms such as severe headache or fainting.    Please obtain all of your results from medical records or have your doctors office obtain the results - share them with your doctor - you should be seen at your doctors office in the next 2 days. Call today to arrange your follow up. Take the medications as prescribed. Please review all of the medicines and only take them if you do not have an allergy to them. Please be aware that if you are taking birth control pills, taking other prescriptions, ESPECIALLY ANTIBIOTICS may make the birth control ineffective - if this is the case, either do not engage in sexual activity or use alternative methods of birth control such as condoms until you have finished the medicine and your family doctor says it is OK to restart them. If you are on a blood thinner such as COUMADIN, be aware that any other medicine that you take may cause the coumadin to either work too much, or not enough - you should have your coumadin level rechecked in next 7 days if this is the case.  ?  It is also a possibility that you have an allergic reaction to any of the medicines that you have been prescribed - Everybody reacts differently to medications and while MOST people have no trouble with most medicines, you may have a reaction such as nausea, vomiting, rash, swelling, shortness of breath. If this is the case, please stop taking the medicine immediately and contact your physician.  ?  You should return to the ER if you develop severe or worsening symptoms.

## 2015-01-16 NOTE — ED Provider Notes (Signed)
CSN: [id number]     Arrival date & time 01/16/15  2022 History   First MD Initiated Contact with Patient 01/16/15 2050     Chief Complaint  Patient presents with  . Hypertension    HPI   Wendy [name] is an 69 y.o. female with h/o HTN, HLD, GERD, anxiety who presents to the ED for evaluation of HTN. She states she went to urgent care earlier today because her BP at home was in the 200s/100s. She states at [facility] it was around 180/110 so they sent her to the ED for further evaluation. She reports she is currently on metoprolol and tekturna for BP. She reports that today she also took half a pill of her old Maxide and half of a Valium because her BP was so high and thought that her anxiety could be making it worse. She reports a dull, aching, diffuse headache to the right side of her head. Denies visual disturbance, tinnitus, dizziness, photophobia. Denies chest pain, SOB.  Past Medical History  Diagnosis Date  . B12 deficiency   . Vitreous hemorrhage ([place])   . Hypertension   . Atypical chest pain   . Hypercholesteremia   . GERD (gastroesophageal reflux disease)   . Diverticulosis of colon   . IBS (irritable bowel syndrome)   . Cervical spondylosis without myelopathy   . Dysesthesia   . Anxiety   . Malignant melanoma [facility])    Past Surgical History  Procedure Laterality Date  . Cesarean section    . Arthroscopic shoulder surgery    . Melanoma surgery from ant neck region  1995  . Anterior cervical discectomy and fusion  1996    Dr. Sherwood Gambler  . Skin cancer removed from nose     Family History  Problem Relation Age of Onset  . Diabetes Mother   . Heart disease Mother   . Hyperlipidemia Mother   . Hypertension Mother   . Cancer Father   . Deep vein thrombosis Father   . Heart attack Father   . Cancer Brother    Social History  Substance Use Topics  . Smoking status: Never Smoker   . Smokeless tobacco: None  . Alcohol Use: No   OB History    No data available     Review  of Systems  All other systems reviewed and are negative.     Allergies  Hctz; Codeine; Hydrocodone-acetaminophen; Lisinopril; Neomycin; Other; Oxycodone; and Pravastatin sodium  Home Medications   Prior to Admission medications   Medication Sig Start Date End Date Taking? Authorizing Provider  aspirin 81 MG tablet Take 81 mg by mouth daily.   Yes Historical Provider, MD  b complex vitamins tablet Take 1 tablet by mouth daily.   Yes Historical Provider, MD  Cholecalciferol (VITAMIN D) 2000 UNITS CAPS Take 1 capsule by mouth daily.   Yes Historical Provider, MD  Coenzyme Q10 (COQ10) 200 MG CAPS Take 1 capsule by mouth daily.   Yes Historical Provider, MD  Cranberry 400 MG CAPS Take 400 mg by mouth daily.   Yes Historical Provider, MD  DIGEST ENZYMES-ANTICHOLINERGIC PO Take 1 capsule by mouth daily.    Yes Historical Provider, MD  ibuprofen (ADVIL,MOTRIN) 200 MG tablet Take 200 mg by mouth every 6 (six) hours as needed for moderate pain.    Yes Historical Provider, MD  metoprolol succinate (TOPROL-XL) 25 MG 24 hr tablet TAKE ONE (1) TABLET BY MOUTH EVERY DAY   Yes Noralee Space, MD  Misc Natural  Products (TART CHERRY ADVANCED) CAPS Take 2 capsules by mouth daily.   Yes Historical Provider, MD  Multiple Vitamins-Minerals (MULTIVITAMIN & MINERAL PO) Take 1 tablet by mouth daily.   Yes Historical Provider, MD  Omega-3 Fatty Acids (THE VERY FINEST FISH OIL) LIQD Take 1 1/2 tbsp daily   Yes Historical Provider, MD  Red Yeast Rice 600 MG CAPS Take 2 capsules by mouth daily.   Yes Historical Provider, MD  TEKTURNA 150 MG tablet TAKE 1 TABLET BY MOUTH DAILY Patient taking differently: TAKE 2 TABLETs BY MOUTH DAILY 10/10/13  Yes Noralee Space, MD  metoprolol succinate (TOPROL-XL) 25 MG 24 hr tablet TAKE 1 TABLET BY MOUTH DAILY 07/20/14   Noralee Space, MD  triamterene-hydrochlorothiazide (MAXZIDE-25) 37.5-25 MG per tablet TAKE 1 TABLET BY MOUTH DAILY 08/17/14   Noralee Space, MD   BP 162/92 mmHg   Pulse 56  Temp(Src) 97.7 F (36.5 C) (Oral)  Resp 15  Ht 5' (1.524 m)  Wt 131 lb (59.421 kg)  BMI 25.58 kg/m2  SpO2 97% Physical Exam  Constitutional: She is oriented to person, place, and time. No distress.  HENT:  Right Ear: External ear normal.  Left Ear: External ear normal.  Nose: Nose normal.  Mouth/Throat: Oropharynx is clear and moist. No oropharyngeal exudate.  Eyes: Conjunctivae and EOM are normal. Pupils are equal, round, and reactive to light. No scleral icterus.  No papilledema appreciated  Neck: Normal range of motion. Neck supple. No tracheal deviation present.  Cardiovascular: Normal rate, regular rhythm, normal heart sounds and intact distal pulses.   No murmur heard. Pulmonary/Chest: Effort normal and breath sounds normal. No respiratory distress. She has no wheezes.  Abdominal: Soft. Bowel sounds are normal. She exhibits no distension. There is no tenderness.  Musculoskeletal: Normal range of motion. She exhibits no edema or tenderness.  Lymphadenopathy:    She has no cervical adenopathy.  Neurological: She is alert and oriented to person, place, and time. She has normal strength. No cranial nerve deficit or sensory deficit. She displays a negative Romberg sign. Coordination and gait normal.  Skin: Skin is warm and dry. No rash noted. She is not diaphoretic.  Psychiatric: She has a normal mood and affect.  Nursing note and vitals reviewed.   Filed Vitals:   01/16/15 2028 01/16/15 2115  BP: 189/97 162/92  Pulse: 66 56  Temp: 97.7 F (36.5 C)   TempSrc: Oral   Resp: 16 15  Height: 5' (1.524 m)   Weight: 131 lb (59.421 kg)   SpO2: 100% 97%     ED Course  Procedures (including critical care time) Labs Review Labs Reviewed - No data to display  Imaging Review No results found. I have personally reviewed and evaluated these images and lab results as part of my medical decision-making.   EKG Interpretation None      MDM   Final diagnoses:   Essential hypertension    i stat chem 8 done at UC unremarkable. Pt's exam is nonfocal. BP continues to trend down in the ED. No further workup indicated at this time. Discussed and reassured pt extensively. She has no evidence of end organ damage, not hypertensive emergency at this point. Discussed pt's med regimen with pharmacy and per their recs told pt to increase metoprolol XL to 50mg  daily. Pt to f/u with PCP within one week for BP re-check and medication management. Return precautions given. Pt verbalized understanding.    Anne Ng, PA-C 01/16/15 2216  Orlie Dakin, MD 01/17/15 [phone]

## 2015-01-16 NOTE — ED Provider Notes (Signed)
Patient developed mild gradual onset headache this morning. Headache much improved presently. She took her blood pressure at home and it was noted to be 219/118. She went to [facility] urgent care center and was sent here for further evaluation Headache was at right parietal area and migrated to left parietal area. She was seen at [facility] urgent care center and sent here for further evaluation. On exam patient is alert Glasgow coma score 15 appears comfortable HEENT exam no facial asymmetry eyes fundi benign lungs clear to auscultation heart regular rate rhythm no murmurs neurologically Glasgow Coma Score 15 gait normal Romberg normal pronator drift normal cranial nerves II through XII grossly intact. No evidence of hypertensive emergency.  Orlie Dakin, MD 01/16/15 2140

## 2015-01-16 NOTE — ED Provider Notes (Signed)
CSN: [id number]     Arrival date & time 01/16/15  1917 History   First MD Initiated Contact with Patient 01/16/15 1928     Chief Complaint  Patient presents with  . Hypertension   (Consider location/radiation/quality/duration/timing/severity/associated sxs/prior Treatment) HPI  She is a 69 year old woman with a history of hypertension here for evaluation of elevated blood pressures. She states that she had a hysterectomy done approximately one month ago. During that hospitalization she developed hyponatremia and her blood pressure medications were adjusted. She is currently taking metoprolol and Tekturna. She used to take Maxzide, but this was stopped as there was concern was causing her hyponatremia. She states that since she has had trouble with her blood pressure.  In the last few days it has been getting worse. Today it has gradually been increasing over the course of the day to a reading of 190/118 which prompted her to come to the urgent care.  She has taken her medications as prescribed. Additionally, she took Maxzide today. She has also taken some Valium just in case anxiety was playing a role.  She does report a headache. No change in her vision. She reports some indigestion, but denies chest pain. No shortness of breath. No swelling of her legs.  She does have an appointment with her PCP coming up in the next week or 2 to recheck her blood pressure.   Past Medical History  Diagnosis Date  . B12 deficiency   . Vitreous hemorrhage ([place])   . Hypertension   . Atypical chest pain   . Hypercholesteremia   . GERD (gastroesophageal reflux disease)   . Diverticulosis of colon   . IBS (irritable bowel syndrome)   . Cervical spondylosis without myelopathy   . Dysesthesia   . Anxiety   . Malignant melanoma [facility])    Past Surgical History  Procedure Laterality Date  . Cesarean section    . Arthroscopic shoulder surgery    . Melanoma surgery from ant neck region  1995  . Anterior cervical  discectomy and fusion  1996    Dr. Sherwood Gambler  . Skin cancer removed from nose     Family History  Problem Relation Age of Onset  . Diabetes Mother   . Heart disease Mother   . Hyperlipidemia Mother   . Hypertension Mother   . Cancer Father   . Deep vein thrombosis Father   . Heart attack Father   . Cancer Brother    Social History  Substance Use Topics  . Smoking status: Never Smoker   . Smokeless tobacco: None  . Alcohol Use: No   OB History    No data available     Review of Systems As in history of present illness Allergies  Codeine and Pravastatin sodium  Home Medications   Prior to Admission medications   Medication Sig Start Date End Date Taking? Authorizing Provider  aspirin 81 MG tablet Take 81 mg by mouth daily.    Historical Provider, MD  Cholecalciferol (VITAMIN D) 2000 UNITS CAPS Take 1 capsule by mouth daily.    Historical Provider, MD  Coenzyme Q10 (COQ10) 200 MG CAPS Take 1 capsule by mouth daily.    Historical Provider, MD  DIGEST ENZYMES-ANTICHOLINERGIC PO Take 1 capsule by mouth 3 (three) times daily with meals.    Historical Provider, MD  ibuprofen (ADVIL,MOTRIN) 200 MG tablet Take 200 mg by mouth every 6 (six) hours as needed.    Historical Provider, MD  metoprolol succinate (TOPROL-XL) 25 MG  24 hr tablet TAKE ONE (1) TABLET BY MOUTH EVERY DAY    Noralee Space, MD  metoprolol succinate (TOPROL-XL) 25 MG 24 hr tablet TAKE 1 TABLET BY MOUTH DAILY 07/20/14   Noralee Space, MD  Multiple Vitamins-Minerals (MULTIVITAMIN & MINERAL PO) Take 1 tablet by mouth daily.    Historical Provider, MD  Omega-3 Fatty Acids (THE VERY FINEST FISH OIL) LIQD Take 1 1/2 tbsp daily    Historical Provider, MD  Probiotic Product (PROBIOTIC ACIDOPHILUS PO) Take 1 capsule by mouth daily.    Historical Provider, MD  Red Yeast Rice 600 MG CAPS Take 2 capsules by mouth daily.    Historical Provider, MD  TEKTURNA 150 MG tablet TAKE 1 TABLET BY MOUTH DAILY 10/10/13   Noralee Space, MD   thiamine (VITAMIN B-1) 100 MG tablet Take 100 mg by mouth daily.    Historical Provider, MD  triamterene-hydrochlorothiazide (MAXZIDE-25) 37.5-25 MG per tablet TAKE 1 TABLET BY MOUTH DAILY 08/17/14   Noralee Space, MD   Meds Ordered and Administered this Visit  Medications - No data to display  BP 219/99 mmHg  Pulse 66  Temp(Src) 98.1 F (36.7 C) (Oral)  Resp 18  SpO2 98% No data found.   Physical Exam  Constitutional: She is oriented to person, place, and time. She appears well-developed and well-nourished. No distress.  Cardiovascular: Normal rate, regular rhythm and normal heart sounds.   No murmur heard. Pulmonary/Chest: Effort normal and breath sounds normal. No respiratory distress. She has no wheezes. She has no rales.  Musculoskeletal: She exhibits no edema or tenderness.  Neurological: She is alert and oriented to person, place, and time.    ED Course  Procedures (including critical care time)  Labs Review Labs Reviewed  POCT I-STAT, CHEM 8 - Abnormal; Notable for the following:    Sodium 133 (*)    Chloride 94 (*)    Glucose, Bld 105 (*)    All other components within normal limits    Imaging Review No results found.    MDM   1. Essential hypertension    I-STAT shows slight hyponatremia at 133.  On review of records this appears to be normal for her. Recheck of blood pressure shows it remains around 200/100.  We'll transfer to the emergency room for blood pressure management and monitoring.      Melony Overly, MD 01/16/15 2010

## 2015-01-16 NOTE — ED Notes (Signed)
Recent surgery from [facility] for bladder lift and hysterectomy and reports low sodium.

## 2015-01-20 DIAGNOSIS — Z9889 Other specified postprocedural states: Secondary | ICD-10-CM | POA: Diagnosis not present

## 2015-01-20 DIAGNOSIS — N938 Other specified abnormal uterine and vaginal bleeding: Secondary | ICD-10-CM | POA: Diagnosis not present

## 2015-01-26 DIAGNOSIS — C44329 Squamous cell carcinoma of skin of other parts of face: Secondary | ICD-10-CM | POA: Diagnosis not present

## 2015-01-26 DIAGNOSIS — Z85828 Personal history of other malignant neoplasm of skin: Secondary | ICD-10-CM | POA: Diagnosis not present

## 2015-01-26 DIAGNOSIS — Z85820 Personal history of malignant melanoma of skin: Secondary | ICD-10-CM | POA: Diagnosis not present

## 2015-02-01 DIAGNOSIS — E871 Hypo-osmolality and hyponatremia: Secondary | ICD-10-CM | POA: Diagnosis not present

## 2015-02-01 DIAGNOSIS — I10 Essential (primary) hypertension: Secondary | ICD-10-CM | POA: Diagnosis not present

## 2015-02-11 DIAGNOSIS — E871 Hypo-osmolality and hyponatremia: Secondary | ICD-10-CM | POA: Diagnosis not present

## 2015-02-11 DIAGNOSIS — M67432 Ganglion, left wrist: Secondary | ICD-10-CM | POA: Diagnosis not present

## 2015-02-11 DIAGNOSIS — G479 Sleep disorder, unspecified: Secondary | ICD-10-CM | POA: Diagnosis not present

## 2015-02-11 DIAGNOSIS — F4320 Adjustment disorder, unspecified: Secondary | ICD-10-CM | POA: Diagnosis not present

## 2015-02-11 DIAGNOSIS — I10 Essential (primary) hypertension: Secondary | ICD-10-CM | POA: Diagnosis not present

## 2015-02-15 ENCOUNTER — Ambulatory Visit (INDEPENDENT_AMBULATORY_CARE_PROVIDER_SITE_OTHER): Payer: Medicare Other | Admitting: Cardiovascular Disease

## 2015-02-15 ENCOUNTER — Encounter: Payer: Self-pay | Admitting: Cardiovascular Disease

## 2015-02-15 VITALS — BP 129/78 | HR 63 | Ht 60.0 in | Wt 132.4 lb

## 2015-02-15 DIAGNOSIS — I779 Disorder of arteries and arterioles, unspecified: Secondary | ICD-10-CM

## 2015-02-15 DIAGNOSIS — I10 Essential (primary) hypertension: Secondary | ICD-10-CM

## 2015-02-15 DIAGNOSIS — N3000 Acute cystitis without hematuria: Secondary | ICD-10-CM | POA: Diagnosis not present

## 2015-02-15 DIAGNOSIS — E7800 Pure hypercholesterolemia, unspecified: Secondary | ICD-10-CM

## 2015-02-15 DIAGNOSIS — I739 Peripheral vascular disease, unspecified: Principal | ICD-10-CM

## 2015-02-15 NOTE — Progress Notes (Signed)
Wendy [name] Date of Birth  16-May-1945       [facility] [address], [place], [place]  [address]   [place], [place]  [address] [place]   Fax  [phone]     Fax [phone]  Problem List:  1. Mild carotid artery disease 2  Essential HTN; 3. Hyperlipidemia   History of Present Illness:  Wendy [name] is a 69 yo who I have seen in the past for elevated cholesterol for years.  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  She was told to see a cardiologist  She walks 4-5 times a week ( 3 miles at a time)  without chest pain or shortness breath. She denies any syncope or presyncopal episodes.   She was seen in consultation by Victorino Dike and her carotid duplex was negative for obstructive disease.  She has mild bilateral carotid artery disease. She has also seen Dr . Rita Ohara.   She works part time for [employer].   Dec. 19, 2016: Had a hysterectomy and bladder tuck at [facility].  Had some hyponatremia -  Called in a cardiology consult.    Did an echo which was normal  Doing better now.   BP spiked in Nov.  Went to the ER .    was taken off [place].   Was started on amlodipine / valsartan 2.5 / 80 a day    Current Outpatient Prescriptions on File Prior to Visit  Medication Sig Dispense Refill  . aspirin 81 MG tablet Take 81 mg by mouth daily.    Marland Kitchen b complex vitamins tablet Take 1 tablet by mouth daily.    . Cholecalciferol (VITAMIN D) 2000 UNITS CAPS Take 1 capsule by mouth daily.    . Coenzyme Q10 (COQ10) 200 MG CAPS Take 1 capsule by mouth daily.    . Cranberry 400 MG CAPS Take 400 mg by mouth daily.    Marland Kitchen DIGEST ENZYMES-ANTICHOLINERGIC PO Take 1 capsule by mouth daily.     Marland Kitchen ibuprofen (ADVIL,MOTRIN) 200 MG tablet Take 200 mg by mouth every 6 (six) hours as needed for moderate pain.     . metoprolol  succinate (TOPROL-XL) 25 MG 24 hr tablet TAKE 1 TABLET BY MOUTH DAILY 30 tablet 0  . Misc Natural Products (TART CHERRY ADVANCED) CAPS Take 2 capsules by mouth daily.    . Multiple Vitamins-Minerals (MULTIVITAMIN & MINERAL PO) Take 1 tablet by mouth daily.    . Omega-3 Fatty Acids (THE VERY FINEST FISH OIL) LIQD Take 1 1/2 tbsp daily    . Red Yeast Rice 600 MG CAPS Take 2 capsules by mouth daily.    Marland Kitchen triamterene-hydrochlorothiazide (MAXZIDE-25) 37.5-25 MG per tablet TAKE 1 TABLET BY MOUTH DAILY 90 tablet 0   No current facility-administered medications on file prior to visit.    Allergies  Allergen Reactions  . Hctz [Hydrochlorothiazide] Other (See Comments)    hyponatremia  . Codeine Nausea Only and Nausea And Vomiting  . Hydrocodone-Acetaminophen Nausea And Vomiting  . Lisinopril Other (See Comments)    REACTION: INTOL to Prinivil w/ pain on urination   . Neomycin Dermatitis  . Other Nausea Only    fragrance  . Oxycodone Nausea And Vomiting  . Pravastatin Sodium Other (See Comments)    REACTION: INTOL to Pravachol w/  myalgias    Past Medical History  Diagnosis Date  . B12 deficiency   . Vitreous hemorrhage ([place])   . Hypertension   . Atypical chest pain   . Hypercholesteremia   . GERD (gastroesophageal reflux disease)   . Diverticulosis of colon   . IBS (irritable bowel syndrome)   . Cervical spondylosis without myelopathy   . Dysesthesia   . Anxiety   . Malignant melanoma [facility])     Past Surgical History  Procedure Laterality Date  . Cesarean section    . Arthroscopic shoulder surgery    . Melanoma surgery from ant neck region  1995  . Anterior cervical discectomy and fusion  1996    Dr. Sherwood Gambler  . Skin cancer removed from nose      History  Smoking status  . Never Smoker   Smokeless tobacco  . Not on file    History  Alcohol Use No    Family History  Problem Relation Age of Onset  . Diabetes Mother   . Heart disease Mother   . Hyperlipidemia  Mother   . Hypertension Mother   . Cancer Father   . Deep vein thrombosis Father   . Heart attack Father   . Cancer Brother     Reviw of Systems:  Reviewed in the HPI.  All other systems are negative.  Physical Exam: Blood pressure 129/78, pulse 63, height 5' (1.524 m), weight 132 lb 6.4 oz (60.056 kg). Wt Readings from Last 3 Encounters:  02/15/15 132 lb 6.4 oz (60.056 kg)  01/16/15 131 lb (59.421 kg)  10/21/13 134 lb 6.4 oz (60.963 kg)     General: Well developed, well nourished, in no acute distress.  Head: Normocephalic, atraumatic, sclera non-icteric, mucus membranes are moist,   Neck: Supple. Carotids are 2 + without bruits. No JVD   Lungs: Clear   Heart: RR, normal S1S2  Abdomen: Soft, non-tender, non-distended with normal bowel sounds.  Msk:  Strength and tone are normal   Extremities: No clubbing or cyanosis. No edema.  Distal pedal pulses are 2+ and equal    Neuro: CN II - XII intact.  Alert and oriented X 3.   Psych:  Normal   ECG: October 21, 2013:  Sinus brady at 82. Otherwise no significant abnormalities.  Assessment / Plan:   1. Mild carotid artery disease-  we'll recheck a carotid duplex scan.  2  Essential HTN:  She's tolerating the amlodipine, valsartan combination. Continue current medications.  3. Hyperlipidemia- followed by her medical doctor.  4. Hyponatremia: She had some episodes with hyponatremia during her hospitalization. This is probably because of her triamterene/HCTZ therapy. I have suggested that she see an endocrinologist or perhaps a nephrologist for further evaluation of this. We could potentially decrease her diuretic to one half tablet daily at this point she seems to be asymptomatic and I'm not sure that we need to make any changes from my standpoint.    Lucinda Spells, Wonda Cheng, MD  02/15/2015 6:10 PM    [address] [place],  [place], [place]  [address] Pager [phone] Phone: [phone]; Fax: [phone]   [facility]  [address] [place],   [address] [phone]   Fax [phone]

## 2015-02-15 NOTE — Patient Instructions (Signed)
Medication Instructions:  Your physician recommends that you continue on your current medications as directed. Please refer to the Current Medication list given to you today.   Labwork: None Ordered   Testing/Procedures: Your physician has requested that you have a carotid duplex. This test is an ultrasound of the carotid arteries in your neck. It looks at blood flow through these arteries that supply the brain with blood. Allow one hour for this exam. There are no restrictions or special instructions.   Follow-Up: Your physician wants you to follow-up in: 1 year with Dr. Nahser.  You will receive a reminder letter in the mail two months in advance. If you don't receive a letter, please call our office to schedule the follow-up appointment.   If you need a refill on your cardiac medications before your next appointment, please call your pharmacy.   Thank you for choosing [facility]! Michelle Swinyer, RN [phone]    

## 2015-02-16 DIAGNOSIS — H2513 Age-related nuclear cataract, bilateral: Secondary | ICD-10-CM | POA: Diagnosis not present

## 2015-02-23 ENCOUNTER — Ambulatory Visit (HOSPITAL_COMMUNITY)
Admission: RE | Admit: 2015-02-23 | Discharge: 2015-02-23 | Disposition: A | Discharge status: Home / Self Care | Payer: Medicare Other | Source: Ambulatory Visit | Attending: Cardiovascular Disease | Admitting: Cardiovascular Disease

## 2015-02-23 DIAGNOSIS — E7800 Pure hypercholesterolemia, unspecified: Secondary | ICD-10-CM | POA: Diagnosis not present

## 2015-02-23 DIAGNOSIS — I6523 Occlusion and stenosis of bilateral carotid arteries: Secondary | ICD-10-CM | POA: Diagnosis not present

## 2015-02-23 DIAGNOSIS — I779 Disorder of arteries and arterioles, unspecified: Secondary | ICD-10-CM | POA: Diagnosis not present

## 2015-02-23 DIAGNOSIS — I739 Peripheral vascular disease, unspecified: Secondary | ICD-10-CM

## 2015-02-23 DIAGNOSIS — I10 Essential (primary) hypertension: Secondary | ICD-10-CM | POA: Diagnosis not present

## 2015-03-10 DIAGNOSIS — H9041 Sensorineural hearing loss, unilateral, right ear, with unrestricted hearing on the contralateral side: Secondary | ICD-10-CM | POA: Diagnosis not present

## 2015-03-10 DIAGNOSIS — H833X1 Noise effects on right inner ear: Secondary | ICD-10-CM | POA: Diagnosis not present

## 2015-03-10 DIAGNOSIS — H6123 Impacted cerumen, bilateral: Secondary | ICD-10-CM | POA: Diagnosis not present

## 2015-03-11 DIAGNOSIS — I10 Essential (primary) hypertension: Secondary | ICD-10-CM | POA: Diagnosis not present

## 2015-03-11 DIAGNOSIS — E871 Hypo-osmolality and hyponatremia: Secondary | ICD-10-CM | POA: Diagnosis not present

## 2015-03-25 DIAGNOSIS — Z85820 Personal history of malignant melanoma of skin: Secondary | ICD-10-CM | POA: Diagnosis not present

## 2015-03-25 DIAGNOSIS — L570 Actinic keratosis: Secondary | ICD-10-CM | POA: Diagnosis not present

## 2015-03-25 DIAGNOSIS — Z85828 Personal history of other malignant neoplasm of skin: Secondary | ICD-10-CM | POA: Diagnosis not present

## 2015-03-25 DIAGNOSIS — L821 Other seborrheic keratosis: Secondary | ICD-10-CM | POA: Diagnosis not present

## 2015-04-16 DIAGNOSIS — T1512XA Foreign body in conjunctival sac, left eye, initial encounter: Secondary | ICD-10-CM | POA: Diagnosis not present

## 2015-05-01 ENCOUNTER — Other Ambulatory Visit: Payer: Self-pay | Admitting: Pulmonary Disease

## 2015-05-04 ENCOUNTER — Other Ambulatory Visit: Payer: Self-pay | Admitting: Pulmonary Disease

## 2015-05-06 NOTE — Telephone Encounter (Signed)
Refill request received from [facility] for Maxzide. Pt LOV 04/22/13 with the following: Patient Instructions     Today we updated your med list in our EPIC system...  Continue your current medications the same...  We refilled your BP meds today and wrote a new prescription for ANUSOL-HC cream to use for your hemorrhoids...  Today we reviewed your recent blood work & gave you a copy for your records...  Call for any questions...    No ROV scheduled at this time. Please advise if ok to refill. Thank you.

## 2015-05-12 DIAGNOSIS — L821 Other seborrheic keratosis: Secondary | ICD-10-CM | POA: Diagnosis not present

## 2015-05-12 DIAGNOSIS — L570 Actinic keratosis: Secondary | ICD-10-CM | POA: Diagnosis not present

## 2015-05-12 DIAGNOSIS — Z85820 Personal history of malignant melanoma of skin: Secondary | ICD-10-CM | POA: Diagnosis not present

## 2015-05-12 DIAGNOSIS — Z85828 Personal history of other malignant neoplasm of skin: Secondary | ICD-10-CM | POA: Diagnosis not present

## 2015-05-12 DIAGNOSIS — L300 Nummular dermatitis: Secondary | ICD-10-CM | POA: Diagnosis not present

## 2015-05-26 DIAGNOSIS — L298 Other pruritus: Secondary | ICD-10-CM | POA: Diagnosis not present

## 2015-05-26 DIAGNOSIS — B373 Candidiasis of vulva and vagina: Secondary | ICD-10-CM | POA: Diagnosis not present

## 2015-05-26 DIAGNOSIS — N952 Postmenopausal atrophic vaginitis: Secondary | ICD-10-CM | POA: Diagnosis not present

## 2015-05-27 DIAGNOSIS — H833X1 Noise effects on right inner ear: Secondary | ICD-10-CM | POA: Diagnosis not present

## 2015-05-27 DIAGNOSIS — M26609 Unspecified temporomandibular joint disorder, unspecified side: Secondary | ICD-10-CM | POA: Diagnosis not present

## 2015-05-27 DIAGNOSIS — H6123 Impacted cerumen, bilateral: Secondary | ICD-10-CM | POA: Diagnosis not present

## 2015-05-27 DIAGNOSIS — H9041 Sensorineural hearing loss, unilateral, right ear, with unrestricted hearing on the contralateral side: Secondary | ICD-10-CM | POA: Diagnosis not present

## 2015-07-14 ENCOUNTER — Other Ambulatory Visit: Payer: Self-pay

## 2015-07-14 DIAGNOSIS — Z1231 Encounter for screening mammogram for malignant neoplasm of breast: Secondary | ICD-10-CM

## 2015-07-21 ENCOUNTER — Ambulatory Visit
Admission: RE | Admit: 2015-07-21 | Discharge: 2015-07-21 | Disposition: A | Discharge status: Home / Self Care | Payer: Medicare Other | Source: Ambulatory Visit

## 2015-07-21 DIAGNOSIS — Z85820 Personal history of malignant melanoma of skin: Secondary | ICD-10-CM | POA: Diagnosis not present

## 2015-07-21 DIAGNOSIS — D224 Melanocytic nevi of scalp and neck: Secondary | ICD-10-CM | POA: Diagnosis not present

## 2015-07-21 DIAGNOSIS — D225 Melanocytic nevi of trunk: Secondary | ICD-10-CM | POA: Diagnosis not present

## 2015-07-21 DIAGNOSIS — D2271 Melanocytic nevi of right lower limb, including hip: Secondary | ICD-10-CM | POA: Diagnosis not present

## 2015-07-21 DIAGNOSIS — Z85828 Personal history of other malignant neoplasm of skin: Secondary | ICD-10-CM | POA: Diagnosis not present

## 2015-07-21 DIAGNOSIS — D1801 Hemangioma of skin and subcutaneous tissue: Secondary | ICD-10-CM | POA: Diagnosis not present

## 2015-07-21 DIAGNOSIS — L570 Actinic keratosis: Secondary | ICD-10-CM | POA: Diagnosis not present

## 2015-07-21 DIAGNOSIS — L821 Other seborrheic keratosis: Secondary | ICD-10-CM | POA: Diagnosis not present

## 2015-07-21 DIAGNOSIS — Z1231 Encounter for screening mammogram for malignant neoplasm of breast: Secondary | ICD-10-CM

## 2015-07-21 DIAGNOSIS — L814 Other melanin hyperpigmentation: Secondary | ICD-10-CM | POA: Diagnosis not present

## 2015-07-21 DIAGNOSIS — L84 Corns and callosities: Secondary | ICD-10-CM | POA: Diagnosis not present

## 2015-07-21 DIAGNOSIS — L218 Other seborrheic dermatitis: Secondary | ICD-10-CM | POA: Diagnosis not present

## 2015-07-21 DIAGNOSIS — D2261 Melanocytic nevi of right upper limb, including shoulder: Secondary | ICD-10-CM | POA: Diagnosis not present

## 2015-08-03 ENCOUNTER — Ambulatory Visit: Payer: Medicare Other

## 2015-08-06 ENCOUNTER — Ambulatory Visit: Payer: Medicare Other

## 2015-08-12 DIAGNOSIS — Z1389 Encounter for screening for other disorder: Secondary | ICD-10-CM | POA: Diagnosis not present

## 2015-08-12 DIAGNOSIS — M47812 Spondylosis without myelopathy or radiculopathy, cervical region: Secondary | ICD-10-CM | POA: Diagnosis not present

## 2015-08-12 DIAGNOSIS — I10 Essential (primary) hypertension: Secondary | ICD-10-CM | POA: Diagnosis not present

## 2015-08-12 DIAGNOSIS — E871 Hypo-osmolality and hyponatremia: Secondary | ICD-10-CM | POA: Diagnosis not present

## 2015-08-12 DIAGNOSIS — E7800 Pure hypercholesterolemia, unspecified: Secondary | ICD-10-CM | POA: Diagnosis not present

## 2015-08-12 DIAGNOSIS — N390 Urinary tract infection, site not specified: Secondary | ICD-10-CM | POA: Diagnosis not present

## 2015-08-12 DIAGNOSIS — Z0000 Encounter for general adult medical examination without abnormal findings: Secondary | ICD-10-CM | POA: Diagnosis not present

## 2015-08-12 DIAGNOSIS — F4320 Adjustment disorder, unspecified: Secondary | ICD-10-CM | POA: Diagnosis not present

## 2015-08-12 DIAGNOSIS — M2669 Other specified disorders of temporomandibular joint: Secondary | ICD-10-CM | POA: Diagnosis not present

## 2015-08-12 DIAGNOSIS — G479 Sleep disorder, unspecified: Secondary | ICD-10-CM | POA: Diagnosis not present

## 2015-08-12 DIAGNOSIS — Z13820 Encounter for screening for osteoporosis: Secondary | ICD-10-CM | POA: Diagnosis not present

## 2015-08-12 DIAGNOSIS — Z6825 Body mass index (BMI) 25.0-25.9, adult: Secondary | ICD-10-CM | POA: Diagnosis not present

## 2015-08-12 DIAGNOSIS — Z79899 Other long term (current) drug therapy: Secondary | ICD-10-CM | POA: Diagnosis not present

## 2015-08-14 ENCOUNTER — Other Ambulatory Visit: Payer: Self-pay | Admitting: Internal Medicine

## 2015-08-14 DIAGNOSIS — Z13820 Encounter for screening for osteoporosis: Secondary | ICD-10-CM

## 2015-08-17 ENCOUNTER — Other Ambulatory Visit: Payer: Self-pay | Admitting: Internal Medicine

## 2015-08-17 DIAGNOSIS — M952 Other acquired deformity of head: Secondary | ICD-10-CM | POA: Diagnosis not present

## 2015-08-17 DIAGNOSIS — L578 Other skin changes due to chronic exposure to nonionizing radiation: Secondary | ICD-10-CM | POA: Diagnosis not present

## 2015-08-17 DIAGNOSIS — Z8589 Personal history of malignant neoplasm of other organs and systems: Secondary | ICD-10-CM | POA: Diagnosis not present

## 2015-08-17 DIAGNOSIS — M810 Age-related osteoporosis without current pathological fracture: Secondary | ICD-10-CM

## 2015-09-02 ENCOUNTER — Other Ambulatory Visit: Payer: Self-pay | Admitting: Internal Medicine

## 2015-09-02 ENCOUNTER — Ambulatory Visit
Admission: RE | Admit: 2015-09-02 | Discharge: 2015-09-02 | Disposition: A | Discharge status: Home / Self Care | Payer: Medicare Other | Source: Ambulatory Visit | Attending: Internal Medicine | Admitting: Internal Medicine

## 2015-09-02 ENCOUNTER — Inpatient Hospital Stay: Admission: RE | Admit: 2015-09-02 | Payer: Medicare Other | Source: Ambulatory Visit

## 2015-09-02 DIAGNOSIS — M810 Age-related osteoporosis without current pathological fracture: Secondary | ICD-10-CM

## 2015-09-02 DIAGNOSIS — M85851 Other specified disorders of bone density and structure, right thigh: Secondary | ICD-10-CM | POA: Diagnosis not present

## 2015-09-02 DIAGNOSIS — Z780 Asymptomatic menopausal state: Secondary | ICD-10-CM | POA: Diagnosis not present

## 2015-11-04 DIAGNOSIS — H6123 Impacted cerumen, bilateral: Secondary | ICD-10-CM | POA: Diagnosis not present

## 2015-11-04 DIAGNOSIS — H833X1 Noise effects on right inner ear: Secondary | ICD-10-CM | POA: Diagnosis not present

## 2015-11-16 DIAGNOSIS — I10 Essential (primary) hypertension: Secondary | ICD-10-CM | POA: Diagnosis not present

## 2015-11-16 DIAGNOSIS — E871 Hypo-osmolality and hyponatremia: Secondary | ICD-10-CM | POA: Diagnosis not present

## 2015-11-16 DIAGNOSIS — Z23 Encounter for immunization: Secondary | ICD-10-CM | POA: Diagnosis not present

## 2015-12-22 DIAGNOSIS — N952 Postmenopausal atrophic vaginitis: Secondary | ICD-10-CM | POA: Diagnosis not present

## 2016-01-12 DIAGNOSIS — L821 Other seborrheic keratosis: Secondary | ICD-10-CM | POA: Diagnosis not present

## 2016-01-12 DIAGNOSIS — D225 Melanocytic nevi of trunk: Secondary | ICD-10-CM | POA: Diagnosis not present

## 2016-01-12 DIAGNOSIS — L570 Actinic keratosis: Secondary | ICD-10-CM | POA: Diagnosis not present

## 2016-01-12 DIAGNOSIS — L814 Other melanin hyperpigmentation: Secondary | ICD-10-CM | POA: Diagnosis not present

## 2016-01-12 DIAGNOSIS — C44319 Basal cell carcinoma of skin of other parts of face: Secondary | ICD-10-CM | POA: Diagnosis not present

## 2016-01-12 DIAGNOSIS — Z85828 Personal history of other malignant neoplasm of skin: Secondary | ICD-10-CM | POA: Diagnosis not present

## 2016-01-12 DIAGNOSIS — D692 Other nonthrombocytopenic purpura: Secondary | ICD-10-CM | POA: Diagnosis not present

## 2016-01-12 DIAGNOSIS — Z85820 Personal history of malignant melanoma of skin: Secondary | ICD-10-CM | POA: Diagnosis not present

## 2016-02-02 DIAGNOSIS — Z85820 Personal history of malignant melanoma of skin: Secondary | ICD-10-CM | POA: Diagnosis not present

## 2016-02-02 DIAGNOSIS — Z85828 Personal history of other malignant neoplasm of skin: Secondary | ICD-10-CM | POA: Diagnosis not present

## 2016-02-02 DIAGNOSIS — C44319 Basal cell carcinoma of skin of other parts of face: Secondary | ICD-10-CM | POA: Diagnosis not present

## 2016-03-27 DIAGNOSIS — H2513 Age-related nuclear cataract, bilateral: Secondary | ICD-10-CM | POA: Diagnosis not present

## 2016-03-30 ENCOUNTER — Ambulatory Visit (INDEPENDENT_AMBULATORY_CARE_PROVIDER_SITE_OTHER): Payer: Medicare Other | Admitting: Cardiovascular Disease

## 2016-03-30 ENCOUNTER — Encounter: Payer: Self-pay | Admitting: Cardiovascular Disease

## 2016-03-30 ENCOUNTER — Encounter (INDEPENDENT_AMBULATORY_CARE_PROVIDER_SITE_OTHER): Payer: Self-pay

## 2016-03-30 VITALS — BP 142/88 | HR 65 | Ht 60.0 in | Wt 136.4 lb

## 2016-03-30 DIAGNOSIS — I779 Disorder of arteries and arterioles, unspecified: Secondary | ICD-10-CM | POA: Diagnosis not present

## 2016-03-30 DIAGNOSIS — I739 Peripheral vascular disease, unspecified: Secondary | ICD-10-CM

## 2016-03-30 DIAGNOSIS — E782 Mixed hyperlipidemia: Secondary | ICD-10-CM

## 2016-03-30 LAB — COMPREHENSIVE METABOLIC PANEL
ALK PHOS: 60 IU/L (ref 39–117)
ALT: 20 IU/L (ref 0–32)
AST: 25 IU/L (ref 0–40)
Albumin/Globulin Ratio: 2.4 — ABNORMAL HIGH (ref 1.2–2.2)
Albumin: 4.8 g/dL (ref 3.5–4.8)
BILIRUBIN TOTAL: 0.5 mg/dL (ref 0.0–1.2)
BUN/Creatinine Ratio: 13 (ref 12–28)
BUN: 10 mg/dL (ref 8–27)
CO2: 23 mmol/L (ref 18–29)
CREATININE: 0.8 mg/dL (ref 0.57–1.00)
Calcium: 9.7 mg/dL (ref 8.7–10.3)
Chloride: 90 mmol/L — ABNORMAL LOW (ref 96–106)
GFR calc Af Amer: 86 mL/min/{1.73_m2} (ref >59)
GFR calc non Af Amer: 75 mL/min/{1.73_m2} (ref >59)
GLOBULIN, TOTAL: 2 g/dL (ref 1.5–4.5)
GLUCOSE: 102 mg/dL — AB (ref 65–99)
Potassium: 4.4 mmol/L (ref 3.5–5.2)
SODIUM: 131 mmol/L — AB (ref 134–144)
Total Protein: 6.8 g/dL (ref 6.0–8.5)

## 2016-03-30 LAB — LIPID PANEL
CHOLESTEROL TOTAL: 199 mg/dL (ref 100–199)
Chol/HDL Ratio: 3.2 ratio units (ref 0.0–4.4)
HDL: 63 mg/dL (ref >39)
LDL CALC: 100 mg/dL — AB (ref 0–99)
Triglycerides: 181 mg/dL — ABNORMAL HIGH (ref 0–149)
VLDL CHOLESTEROL CAL: 36 mg/dL (ref 5–40)

## 2016-03-30 NOTE — Patient Instructions (Signed)

## 2016-03-30 NOTE — Progress Notes (Signed)
Wendy [name] Date of Birth  [birth year]       [facility] [address], [place], [place]  [address]   [place], [place]  [address] [address]   Fax  [phone]     Fax [phone]  Problem List:  1. Mild carotid artery disease 2  Essential HTN; 3. Hyperlipidemia   History of Present Illness:  Wendy [name] is a 71 yo who I have seen in the past for elevated cholesterol for years.  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  She was told to see a cardiologist  She walks 4-5 times a week ( 3 miles at a time)  without chest pain or shortness breath. She denies any syncope or presyncopal episodes.   She was seen in consultation by Victorino Dike and her carotid duplex was negative for obstructive disease.  She has mild bilateral carotid artery disease. She has also seen Dr . Rita Ohara.   She works part time for [employer].   Dec. 19, 2016: Had a hysterectomy and bladder tuck at [facility].  Had some hyponatremia -  Called in a cardiology consult.    Did an echo which was normal  Doing better now.   BP spiked in Nov.  Went to the ER .    was taken off [place].   Was started on amlodipine / valsartan 2.5 / 80 a day  Feb. 2, 2018:  Wendy [name] is seen back for her her yearly visit .   Not exercising much .     Current Outpatient Prescriptions on File Prior to Visit  Medication Sig Dispense Refill  . ALPRAZolam (XANAX) 0.5 MG tablet Take 0.5 mg by mouth at bedtime. 1/2 TABLET BY MOUTH AT BEDTIME FOR SLEEP    . amLODipine-valsartan (EXFORGE) 5-160 MG tablet Take 0.5 tablets by mouth daily.    Marland Kitchen aspirin 81 MG tablet Take 81 mg by mouth daily.    Marland Kitchen b complex vitamins tablet Take 1 tablet by mouth daily.    . Cholecalciferol (VITAMIN D) 2000 UNITS CAPS Take 1 capsule by mouth daily.    . Coenzyme Q10 (COQ10) 200 MG CAPS Take  1 capsule by mouth daily.    . Cranberry 400 MG CAPS Take 400 mg by mouth daily.    Marland Kitchen DIGEST ENZYMES-ANTICHOLINERGIC PO Take 1 capsule by mouth 3 (three) times daily.     Marland Kitchen ibuprofen (ADVIL,MOTRIN) 200 MG tablet Take 200 mg by mouth every 6 (six) hours as needed for moderate pain.     . metoprolol succinate (TOPROL-XL) 25 MG 24 hr tablet TAKE 1 TABLET BY MOUTH DAILY 30 tablet 0  . Misc Natural Products (TART CHERRY ADVANCED) CAPS Take 2 capsules by mouth daily.    . Multiple Vitamins-Minerals (MULTIVITAMIN & MINERAL PO) Take 1 tablet by mouth daily.    . Omega-3 Fatty Acids (THE VERY FINEST FISH OIL) LIQD Take 1 1/2 tbsp daily    . Red Yeast Rice 600 MG CAPS Take 2 capsules by mouth daily.     No current facility-administered medications on file prior to visit.     Allergies  Allergen Reactions  . Hctz [Hydrochlorothiazide] Other (See Comments)    hyponatremia  . Codeine Nausea Only and Nausea And Vomiting  . Hydrocodone-Acetaminophen Nausea And Vomiting  . Lisinopril Other (  See Comments)    REACTION: INTOL to Prinivil w/ pain on urination   . Neomycin Dermatitis  . Other Nausea Only    fragrance  . Oxycodone Nausea And Vomiting  . Pravastatin Sodium Other (See Comments)    REACTION: INTOL to Pravachol w/ myalgias    Past Medical History:  Diagnosis Date  . Anxiety   . Atypical chest pain   . B12 deficiency   . Cervical spondylosis without myelopathy   . Diverticulosis of colon   . Dysesthesia   . GERD (gastroesophageal reflux disease)   . Hypercholesteremia   . Hypertension   . IBS (irritable bowel syndrome)   . Malignant melanoma ([place])   . Vitreous hemorrhage ([facility])     Past Surgical History:  Procedure Laterality Date  . anterior cervical discectomy and fusion  1996   Dr. Sherwood Gambler  . arthroscopic shoulder surgery    . CESAREAN SECTION    . melanoma surgery from ant neck region  1995  . skin cancer removed from nose      History  Smoking Status  . Never  Smoker  Smokeless Tobacco  . Never Used    History  Alcohol Use No    Family History  Problem Relation Age of Onset  . Diabetes Mother   . Heart disease Mother   . Hyperlipidemia Mother   . Hypertension Mother   . Cancer Father   . Deep vein thrombosis Father   . Heart attack Father   . Cancer Brother     Reviw of Systems:  Reviewed in the HPI.  All other systems are negative.  Physical Exam: Blood pressure (!) 142/88, pulse 65, height 5' (1.524 m), weight 136 lb 6.4 oz (61.9 kg). Wt Readings from Last 3 Encounters:  03/30/16 136 lb 6.4 oz (61.9 kg)  02/15/15 132 lb 6.4 oz (60.1 kg)  01/16/15 131 lb (59.4 kg)     General: Well developed, well nourished, in no acute distress.  Head: Normocephalic, atraumatic, sclera non-icteric, mucus membranes are moist,   Neck: Supple. Carotids are 2 + without bruits. No JVD   Lungs: Clear   Heart: RR, normal S1S2  Abdomen: Soft, non-tender, non-distended with normal bowel sounds.  Msk:  Strength and tone are normal   Extremities: No clubbing or cyanosis. No edema.  Distal pedal pulses are 2+ and equal    Neuro: CN II - XII intact.  Alert and oriented X 3.   Psych:  Normal   ECG: 03/30/2016: Normal sinus rhythm at 65. She has no ST or T wave changes.  Assessment / Plan:   1. Mild carotid artery disease-  we'll recheck a carotid duplex scan in 1 year   2  Essential HTN:  She's tolerating the amlodipine, valsartan combination. Continue current medications.  3. Hyperlipidemia- generally followed by her medical doctor.  Will check her labs today    4. Hyponatremia: She had some episodes with hyponatremia during her hospitalization.  Will recheck today     [name], MD  03/30/2016 10:45 AM    [place],  [place], [place]  [address] Pager [phone] Phone: [phone]; Fax: [phone]

## 2016-03-31 DIAGNOSIS — N644 Mastodynia: Secondary | ICD-10-CM | POA: Diagnosis not present

## 2016-05-17 DIAGNOSIS — D0439 Carcinoma in situ of skin of other parts of face: Secondary | ICD-10-CM | POA: Diagnosis not present

## 2016-05-17 DIAGNOSIS — Z85820 Personal history of malignant melanoma of skin: Secondary | ICD-10-CM | POA: Diagnosis not present

## 2016-05-17 DIAGNOSIS — D225 Melanocytic nevi of trunk: Secondary | ICD-10-CM | POA: Diagnosis not present

## 2016-05-17 DIAGNOSIS — L821 Other seborrheic keratosis: Secondary | ICD-10-CM | POA: Diagnosis not present

## 2016-05-17 DIAGNOSIS — Z85828 Personal history of other malignant neoplasm of skin: Secondary | ICD-10-CM | POA: Diagnosis not present

## 2016-05-17 DIAGNOSIS — L814 Other melanin hyperpigmentation: Secondary | ICD-10-CM | POA: Diagnosis not present

## 2016-05-17 DIAGNOSIS — D1801 Hemangioma of skin and subcutaneous tissue: Secondary | ICD-10-CM | POA: Diagnosis not present

## 2016-05-17 DIAGNOSIS — L570 Actinic keratosis: Secondary | ICD-10-CM | POA: Diagnosis not present

## 2016-06-05 DIAGNOSIS — M26609 Unspecified temporomandibular joint disorder, unspecified side: Secondary | ICD-10-CM | POA: Diagnosis not present

## 2016-06-05 DIAGNOSIS — Z85828 Personal history of other malignant neoplasm of skin: Secondary | ICD-10-CM | POA: Diagnosis not present

## 2016-06-05 DIAGNOSIS — D0439 Carcinoma in situ of skin of other parts of face: Secondary | ICD-10-CM | POA: Diagnosis not present

## 2016-06-05 DIAGNOSIS — Z85820 Personal history of malignant melanoma of skin: Secondary | ICD-10-CM | POA: Diagnosis not present

## 2016-06-05 DIAGNOSIS — H833X1 Noise effects on right inner ear: Secondary | ICD-10-CM | POA: Diagnosis not present

## 2016-06-05 DIAGNOSIS — L821 Other seborrheic keratosis: Secondary | ICD-10-CM | POA: Diagnosis not present

## 2016-06-05 DIAGNOSIS — H6123 Impacted cerumen, bilateral: Secondary | ICD-10-CM | POA: Diagnosis not present

## 2016-06-28 ENCOUNTER — Other Ambulatory Visit: Payer: Self-pay | Admitting: Obstetrics & Gynecology

## 2016-06-28 DIAGNOSIS — Z1231 Encounter for screening mammogram for malignant neoplasm of breast: Secondary | ICD-10-CM

## 2016-07-17 DIAGNOSIS — L570 Actinic keratosis: Secondary | ICD-10-CM | POA: Diagnosis not present

## 2016-07-17 DIAGNOSIS — B079 Viral wart, unspecified: Secondary | ICD-10-CM | POA: Diagnosis not present

## 2016-07-17 DIAGNOSIS — D485 Neoplasm of uncertain behavior of skin: Secondary | ICD-10-CM | POA: Diagnosis not present

## 2016-07-17 DIAGNOSIS — Z85828 Personal history of other malignant neoplasm of skin: Secondary | ICD-10-CM | POA: Diagnosis not present

## 2016-07-17 DIAGNOSIS — L820 Inflamed seborrheic keratosis: Secondary | ICD-10-CM | POA: Diagnosis not present

## 2016-07-17 DIAGNOSIS — Z85820 Personal history of malignant melanoma of skin: Secondary | ICD-10-CM | POA: Diagnosis not present

## 2016-07-17 DIAGNOSIS — L821 Other seborrheic keratosis: Secondary | ICD-10-CM | POA: Diagnosis not present

## 2016-07-25 ENCOUNTER — Ambulatory Visit: Payer: Medicare Other

## 2016-07-26 ENCOUNTER — Ambulatory Visit: Payer: Medicare Other

## 2016-07-28 ENCOUNTER — Ambulatory Visit
Admission: RE | Admit: 2016-07-28 | Discharge: 2016-07-28 | Disposition: A | Discharge status: Home / Self Care | Payer: Medicare Other | Source: Ambulatory Visit | Attending: Obstetrics & Gynecology | Admitting: Obstetrics & Gynecology

## 2016-07-28 DIAGNOSIS — Z1231 Encounter for screening mammogram for malignant neoplasm of breast: Secondary | ICD-10-CM

## 2016-08-28 DIAGNOSIS — L578 Other skin changes due to chronic exposure to nonionizing radiation: Secondary | ICD-10-CM | POA: Diagnosis not present

## 2016-08-28 DIAGNOSIS — Z8589 Personal history of malignant neoplasm of other organs and systems: Secondary | ICD-10-CM | POA: Diagnosis not present

## 2016-08-28 DIAGNOSIS — M952 Other acquired deformity of head: Secondary | ICD-10-CM | POA: Diagnosis not present

## 2016-08-28 DIAGNOSIS — L989 Disorder of the skin and subcutaneous tissue, unspecified: Secondary | ICD-10-CM | POA: Diagnosis not present

## 2016-08-28 DIAGNOSIS — L905 Scar conditions and fibrosis of skin: Secondary | ICD-10-CM | POA: Diagnosis not present

## 2016-09-04 DIAGNOSIS — Z6827 Body mass index (BMI) 27.0-27.9, adult: Secondary | ICD-10-CM | POA: Diagnosis not present

## 2016-09-04 DIAGNOSIS — N959 Unspecified menopausal and perimenopausal disorder: Secondary | ICD-10-CM | POA: Diagnosis not present

## 2016-09-04 DIAGNOSIS — K644 Residual hemorrhoidal skin tags: Secondary | ICD-10-CM | POA: Diagnosis not present

## 2016-09-18 DIAGNOSIS — D2262 Melanocytic nevi of left upper limb, including shoulder: Secondary | ICD-10-CM | POA: Diagnosis not present

## 2016-09-18 DIAGNOSIS — L814 Other melanin hyperpigmentation: Secondary | ICD-10-CM | POA: Diagnosis not present

## 2016-09-18 DIAGNOSIS — Z85828 Personal history of other malignant neoplasm of skin: Secondary | ICD-10-CM | POA: Diagnosis not present

## 2016-09-18 DIAGNOSIS — Z85820 Personal history of malignant melanoma of skin: Secondary | ICD-10-CM | POA: Diagnosis not present

## 2016-09-18 DIAGNOSIS — L821 Other seborrheic keratosis: Secondary | ICD-10-CM | POA: Diagnosis not present

## 2016-09-18 DIAGNOSIS — D2261 Melanocytic nevi of right upper limb, including shoulder: Secondary | ICD-10-CM | POA: Diagnosis not present

## 2016-09-18 DIAGNOSIS — D225 Melanocytic nevi of trunk: Secondary | ICD-10-CM | POA: Diagnosis not present

## 2016-09-18 DIAGNOSIS — C44321 Squamous cell carcinoma of skin of nose: Secondary | ICD-10-CM | POA: Diagnosis not present

## 2016-09-18 DIAGNOSIS — D1801 Hemangioma of skin and subcutaneous tissue: Secondary | ICD-10-CM | POA: Diagnosis not present

## 2016-09-18 DIAGNOSIS — L570 Actinic keratosis: Secondary | ICD-10-CM | POA: Diagnosis not present

## 2016-09-22 DIAGNOSIS — E7800 Pure hypercholesterolemia, unspecified: Secondary | ICD-10-CM | POA: Diagnosis not present

## 2016-09-22 DIAGNOSIS — I10 Essential (primary) hypertension: Secondary | ICD-10-CM | POA: Diagnosis not present

## 2016-09-22 DIAGNOSIS — Z1389 Encounter for screening for other disorder: Secondary | ICD-10-CM | POA: Diagnosis not present

## 2016-09-22 DIAGNOSIS — M47812 Spondylosis without myelopathy or radiculopathy, cervical region: Secondary | ICD-10-CM | POA: Diagnosis not present

## 2016-09-22 DIAGNOSIS — Z0001 Encounter for general adult medical examination with abnormal findings: Secondary | ICD-10-CM | POA: Diagnosis not present

## 2016-09-22 DIAGNOSIS — M2669 Other specified disorders of temporomandibular joint: Secondary | ICD-10-CM | POA: Diagnosis not present

## 2016-09-22 DIAGNOSIS — Z79899 Other long term (current) drug therapy: Secondary | ICD-10-CM | POA: Diagnosis not present

## 2016-09-22 DIAGNOSIS — N390 Urinary tract infection, site not specified: Secondary | ICD-10-CM | POA: Diagnosis not present

## 2016-09-22 DIAGNOSIS — E559 Vitamin D deficiency, unspecified: Secondary | ICD-10-CM | POA: Diagnosis not present

## 2016-09-22 DIAGNOSIS — E871 Hypo-osmolality and hyponatremia: Secondary | ICD-10-CM | POA: Diagnosis not present

## 2016-09-22 DIAGNOSIS — G479 Sleep disorder, unspecified: Secondary | ICD-10-CM | POA: Diagnosis not present

## 2016-09-22 DIAGNOSIS — F4320 Adjustment disorder, unspecified: Secondary | ICD-10-CM | POA: Diagnosis not present

## 2016-10-04 ENCOUNTER — Telehealth: Payer: Self-pay | Admitting: Cardiovascular Disease

## 2016-10-04 MED ORDER — IRBESARTAN 150 MG PO TABS: 150.0000 mg | ORAL_TABLET | Freq: Every day | ORAL | 11 refills | Status: DC

## 2016-10-04 NOTE — Telephone Encounter (Signed)
Pt c/o medication issue:  1. Name of Medication: Amlodipine/Valsartan  2. How are you currently taking this medication (dosage and times per day)? 5mg /160mg    3. Are you having a reaction (difficulty breathing--STAT)? Ankles swelling and tired   4. What is your medication issue? Valsartan Recall

## 2016-10-04 NOTE — Telephone Encounter (Signed)
Spoke with patient and reviewed Wendy [name]'s advice with her. I scheduled her for hypertension clinic on 8/23. She verbalized understanding and agreement with plan of care and is aware to call back with questions or concerns prior to return visit. She was very grateful for the call.

## 2016-10-04 NOTE — Telephone Encounter (Signed)
Pt's ankle swelling may be due to amlodipine. Would recommend stopping valsartan-amlodipine 160-5mg  1/2 tablet daily due to recall/LEE and starting higher dose of just irbesartan 150mg  - 1 tab daily with f/u BMET and BP check in pharmacy clinic in 2 weeks.

## 2016-10-04 NOTE — Telephone Encounter (Signed)
Routing to [place] for advice

## 2016-10-18 DIAGNOSIS — Z85828 Personal history of other malignant neoplasm of skin: Secondary | ICD-10-CM | POA: Diagnosis not present

## 2016-10-18 DIAGNOSIS — Z85820 Personal history of malignant melanoma of skin: Secondary | ICD-10-CM | POA: Diagnosis not present

## 2016-10-18 DIAGNOSIS — C44311 Basal cell carcinoma of skin of nose: Secondary | ICD-10-CM | POA: Diagnosis not present

## 2016-10-19 ENCOUNTER — Ambulatory Visit (INDEPENDENT_AMBULATORY_CARE_PROVIDER_SITE_OTHER): Payer: Medicare Other | Admitting: Pharmacist

## 2016-10-19 VITALS — BP 148/78 | HR 55

## 2016-10-19 DIAGNOSIS — I10 Essential (primary) hypertension: Secondary | ICD-10-CM

## 2016-10-19 LAB — BASIC METABOLIC PANEL
BUN/Creatinine Ratio: 11 — ABNORMAL LOW (ref 12–28)
BUN: 11 mg/dL (ref 8–27)
CHLORIDE: 95 mmol/L — AB (ref 96–106)
CO2: 24 mmol/L (ref 20–29)
Calcium: 9.7 mg/dL (ref 8.7–10.3)
Creatinine, Ser: 1.04 mg/dL — ABNORMAL HIGH (ref 0.57–1.00)
GFR calc Af Amer: 62 mL/min/{1.73_m2} (ref >59)
GFR, EST NON AFRICAN AMERICAN: 54 mL/min/{1.73_m2} — AB (ref >59)
GLUCOSE: 86 mg/dL (ref 65–99)
Potassium: 4.9 mmol/L (ref 3.5–5.2)
SODIUM: 134 mmol/L (ref 134–144)

## 2016-10-19 NOTE — Patient Instructions (Addendum)
It was good seeing you today.  Your blood pressure is elevated today.   Change the timing of your irbesartan 150 mg to 1 tablet by mouth once daily in the mornings.  Continue your other blood pressure medications as prescribed.  Continue to monitor your blood pressure at home.   Keep a log of your readings and bring the log and home cuff with you to your next visit.   If you notice your blood pressure increasing or decreasing persistently or if you experience increased dizziness, lightheadedness, or fatigue, please contact the clinic at [phone].  Follow up in clinic in 4 weeks on 11/16/16 at 11:00 AM for evaluation.

## 2016-10-19 NOTE — Progress Notes (Signed)
Patient ID: Wendy [name]                 DOB: 24-Oct-1945                      MRN: [id number]     HPI: Wendy [name] is a 71 y.o. female referred by Dr. Acie Fredrickson to HTN clinic. PMH is significant for HLD, HTN, IBS, malignant melanoma, diverticulosis of colon, GERD, carotid artery disease. Pt went to the ER in 12/2014 for elevated BP and was started on valsartan-amlodipine 160-5 mg 1/2 tab daily in addition to her existing BP medications. She was seen by Dr. Acie Fredrickson in 03/2016, and BP was 142/88. She was tolerating valsartan-amlodipine [name] at that time and current regimen was continued. Pt called in on 10/04/2016 to report that she has been affected by the valsartan recall and that she is feeling tired and experiencing ankle swelling. Valsartan-amlodipine was discontinued as a result and substituted with irbesartan 150 mg daily.   Pt presents today for f/u. Pt states that she is tolerating irbesartan [name]. Swelling in the ankle has resolved since discontinuing valsartan-amlodipine. Since starting irbesartan, she reports BP readings have been around 130s/80s, with the occasional values in 140s/80s-90s. She states that she feels dizziness, lightheadedness, and fatigue from time to time but does not think it's due to the medication changes. Pt has not had any BP meds today and did drink coffee this morning prior to the visit. HR in the clinic is 55, and BP is 148/78.   Current HTN meds:  Dyazide 37.5-25mg  0.5 tab PRN, prescribed for fluid in ear, last dose was 3 days ago Toprol XL 50 mg 1/2 tab daily (10 PM) Irbesartan 150 mg daily (Takes around 3 to 4 PM)  Previously tried: Valsartan-amlodipine 160-5 mg 1/2 tab daily - ankle swelling  BP goal: <130/80 mmHg  Family History:  Mother - diabetes, heart disease, HLD, HTN Father - cancer, DVT, MI Brother - cancer  Social History: Never smoked. Denies alcohol or illicit drug use.   Diet: Drinks half a cup of coffee in the mornings occasionally. Does not  add sodium to foods. Cooks more at home. Does not really eat too much processed meats. Likes to eat vegetables and fruits. No soda. Drinks mostly water.   Exercise: Likes to walk but has not been able to recently due to weather.   Home BP readings: Since med changes, BP in 130s/80s, with occasional values in 140s/80s to 90s  Wt Readings from Last 3 Encounters:  03/30/16 136 lb 6.4 oz (61.9 kg)  02/15/15 132 lb 6.4 oz (60.1 kg)  01/16/15 131 lb (59.4 kg)   BP Readings from Last 3 Encounters:  03/30/16 (!) 142/88  02/15/15 129/78  01/16/15 156/86   Pulse Readings from Last 3 Encounters:  03/30/16 65  02/15/15 63  01/16/15 (!) 58    Renal function: CrCl cannot be calculated (Patient's most recent lab result is older than the maximum 21 days allowed.).  Past Medical History:  Diagnosis Date  . Anxiety   . Atypical chest pain   . B12 deficiency   . Cervical spondylosis without myelopathy   . Diverticulosis of colon   . Dysesthesia   . GERD (gastroesophageal reflux disease)   . Hypercholesteremia   . Hypertension   . IBS (irritable bowel syndrome)   . Malignant melanoma ([place])   . Vitreous hemorrhage ([place])     Current Outpatient Prescriptions on File Prior to  Visit  Medication Sig Dispense Refill  . ALPRAZolam (XANAX) 0.5 MG tablet Take 0.5 mg by mouth at bedtime. 1/2 TABLET BY MOUTH AT BEDTIME FOR SLEEP    . aspirin 81 MG tablet Take 81 mg by mouth daily.    Marland Kitchen b complex vitamins tablet Take 1 tablet by mouth daily.    . Cholecalciferol (VITAMIN D) 2000 UNITS CAPS Take 1 capsule by mouth daily.    . Coenzyme Q10 (COQ10) 200 MG CAPS Take 1 capsule by mouth daily.    . Cranberry 400 MG CAPS Take 400 mg by mouth daily.    Marland Kitchen DIGEST ENZYMES-ANTICHOLINERGIC PO Take 1 capsule by mouth 3 (three) times daily.     Marland Kitchen ibuprofen (ADVIL,MOTRIN) 200 MG tablet Take 200 mg by mouth every 6 (six) hours as needed for moderate pain.     Marland Kitchen irbesartan (AVAPRO) 150 MG tablet Take 1 tablet (150  mg total) by mouth daily. 30 tablet 11  . metoprolol succinate (TOPROL-XL) 25 MG 24 hr tablet TAKE 1 TABLET BY MOUTH DAILY 30 tablet 0  . Misc Natural Products (TART CHERRY ADVANCED) CAPS Take 2 capsules by mouth daily.    . Multiple Vitamins-Minerals (MULTIVITAMIN & MINERAL PO) Take 1 tablet by mouth daily.    . Omega-3 Fatty Acids (THE VERY FINEST FISH OIL) LIQD Take 1 1/2 tbsp daily    . Red Yeast Rice 600 MG CAPS Take 2 capsules by mouth daily.    Marland Kitchen triamterene-hydrochlorothiazide (DYAZIDE) 37.5-25 MG capsule Take 0.5 tablet by mouth once weekly.     No current facility-administered medications on file prior to visit.     Allergies  Allergen Reactions  . Hctz [Hydrochlorothiazide] Other (See Comments)    hyponatremia  . Codeine Nausea Only and Nausea And Vomiting  . Hydrocodone-Acetaminophen Nausea And Vomiting  . Lisinopril Other (See Comments)    REACTION: INTOL to Prinivil w/ pain on urination   . Neomycin Dermatitis  . Other Nausea Only    fragrance  . Oxycodone Nausea And Vomiting  . Pravastatin Sodium Other (See Comments)    REACTION: INTOL to Pravachol w/ myalgias     Assessment/Plan:   Hypertension: Pt's BP is above her goal of <130/80 mmHg. Home BP readings have been in the 130/80 range, and pt prefers not to titrate irbesartan at this time. Adjust the dose time of irbesartan 150 mg from 3-4PM to the mornings to provide better BP control throughout the day (since Toprol at night). Have pt monitor BP readings closely at home and bring in a log of the readings and home cuff to next visit. Obtain BMET today to assess renal fxn and electrolytes after switching to irbesartan. F/u in clinic in 4 weeks.  -Barkley Boards, PharmD Student   Thank you, Lelan Pons. [facility], [place]  [address], [place], [place] [address]  Phone: [phone]; Fax: [phone] 10/19/2016 9:44 PM

## 2016-10-19 NOTE — Progress Notes (Signed)
Patient ID: [name]                 DOB: 05/25/1945                      MRN: [id number]     HPI: Wendy [name] is a 71 y.o. female patient of Dr. Acie Fredrickson who presents today for hypertension evaluation.  PMH includes CAD, HTN, HLD. She recently called in about valsartan recall and ankle swelling with amlodipine. Due to this she was changed from valsartan/amlodipine 160/5mg  (1/2 tablet daily) to irbesartan 150mg  daily alone. She presents today for follow up BMET and BP check to ensure pressures are controlled.    Current HTN meds:  Irbesartan 150mg  daily Metoprolol succinate 25mg  daily Triamterene-HCTZ 37.5-25mg  daily   Previously tried: amlodipine - ankle swelling  BP goal: <130/80  Family History:   Social History:   Diet:   Exercise:   Home BP readings:   Wt Readings from Last 3 Encounters:  03/30/16 136 lb 6.4 oz (61.9 kg)  02/15/15 132 lb 6.4 oz (60.1 kg)  01/16/15 131 lb (59.4 kg)   BP Readings from Last 3 Encounters:  03/30/16 (!) 142/88  02/15/15 129/78  01/16/15 156/86   Pulse Readings from Last 3 Encounters:  03/30/16 65  02/15/15 63  01/16/15 (!) 58    Renal function: CrCl cannot be calculated (Patient's most recent lab result is older than the maximum 21 days allowed.).  Past Medical History:  Diagnosis Date  . Anxiety   . Atypical chest pain   . B12 deficiency   . Cervical spondylosis without myelopathy   . Diverticulosis of colon   . Dysesthesia   . GERD (gastroesophageal reflux disease)   . Hypercholesteremia   . Hypertension   . IBS (irritable bowel syndrome)   . Malignant melanoma ([place])   . Vitreous hemorrhage ([place])     Current Outpatient Prescriptions on File Prior to Visit  Medication Sig Dispense Refill  . ALPRAZolam (XANAX) 0.5 MG tablet Take 0.5 mg by mouth at bedtime. 1/2 TABLET BY MOUTH AT BEDTIME FOR SLEEP    . aspirin 81 MG tablet Take 81 mg by mouth daily.    Marland Kitchen b complex vitamins tablet Take 1 tablet by mouth daily.      . Cholecalciferol (VITAMIN D) 2000 UNITS CAPS Take 1 capsule by mouth daily.    . Coenzyme Q10 (COQ10) 200 MG CAPS Take 1 capsule by mouth daily.    . Cranberry 400 MG CAPS Take 400 mg by mouth daily.    Marland Kitchen DIGEST ENZYMES-ANTICHOLINERGIC PO Take 1 capsule by mouth 3 (three) times daily.     Marland Kitchen ibuprofen (ADVIL,MOTRIN) 200 MG tablet Take 200 mg by mouth every 6 (six) hours as needed for moderate pain.     Marland Kitchen irbesartan (AVAPRO) 150 MG tablet Take 1 tablet (150 mg total) by mouth daily. 30 tablet 11  . metoprolol succinate (TOPROL-XL) 25 MG 24 hr tablet TAKE 1 TABLET BY MOUTH DAILY 30 tablet 0  . Misc Natural Products (TART CHERRY ADVANCED) CAPS Take 2 capsules by mouth daily.    . Multiple Vitamins-Minerals (MULTIVITAMIN & MINERAL PO) Take 1 tablet by mouth daily.    . Omega-3 Fatty Acids (THE VERY FINEST FISH OIL) LIQD Take 1 1/2 tbsp daily    . Red Yeast Rice 600 MG CAPS Take 2 capsules by mouth daily.    Marland Kitchen triamterene-hydrochlorothiazide (DYAZIDE) 37.5-25 MG capsule Take 0.5 tablet by  mouth once weekly.     No current facility-administered medications on file prior to visit.     Allergies  Allergen Reactions  . Hctz [Hydrochlorothiazide] Other (See Comments)    hyponatremia  . Codeine Nausea Only and Nausea And Vomiting  . Hydrocodone-Acetaminophen Nausea And Vomiting  . Lisinopril Other (See Comments)    REACTION: INTOL to Prinivil w/ pain on urination   . Neomycin Dermatitis  . Other Nausea Only    fragrance  . Oxycodone Nausea And Vomiting  . Pravastatin Sodium Other (See Comments)    REACTION: INTOL to Pravachol w/ myalgias    There were no vitals taken for this visit.   Assessment/Plan: Hypertension:    Thank you, Wendy [name], [facility]  10/19/2016 8:38 AM

## 2016-10-26 DIAGNOSIS — L821 Other seborrheic keratosis: Secondary | ICD-10-CM | POA: Diagnosis not present

## 2016-10-26 DIAGNOSIS — D485 Neoplasm of uncertain behavior of skin: Secondary | ICD-10-CM | POA: Diagnosis not present

## 2016-10-26 DIAGNOSIS — L570 Actinic keratosis: Secondary | ICD-10-CM | POA: Diagnosis not present

## 2016-10-27 DIAGNOSIS — Z8589 Personal history of malignant neoplasm of other organs and systems: Secondary | ICD-10-CM | POA: Diagnosis not present

## 2016-10-27 DIAGNOSIS — L905 Scar conditions and fibrosis of skin: Secondary | ICD-10-CM | POA: Diagnosis not present

## 2016-10-27 DIAGNOSIS — M952 Other acquired deformity of head: Secondary | ICD-10-CM | POA: Diagnosis not present

## 2016-10-27 DIAGNOSIS — L578 Other skin changes due to chronic exposure to nonionizing radiation: Secondary | ICD-10-CM | POA: Diagnosis not present

## 2016-10-31 DIAGNOSIS — N390 Urinary tract infection, site not specified: Secondary | ICD-10-CM | POA: Diagnosis not present

## 2016-11-02 DIAGNOSIS — M26609 Unspecified temporomandibular joint disorder, unspecified side: Secondary | ICD-10-CM | POA: Diagnosis not present

## 2016-11-02 DIAGNOSIS — H833X1 Noise effects on right inner ear: Secondary | ICD-10-CM | POA: Diagnosis not present

## 2016-11-02 DIAGNOSIS — H9041 Sensorineural hearing loss, unilateral, right ear, with unrestricted hearing on the contralateral side: Secondary | ICD-10-CM | POA: Diagnosis not present

## 2016-11-02 DIAGNOSIS — H6123 Impacted cerumen, bilateral: Secondary | ICD-10-CM | POA: Diagnosis not present

## 2016-11-14 ENCOUNTER — Encounter: Payer: Self-pay | Admitting: Pharmacist

## 2016-11-14 ENCOUNTER — Ambulatory Visit (INDEPENDENT_AMBULATORY_CARE_PROVIDER_SITE_OTHER): Payer: Medicare Other | Admitting: Pharmacist

## 2016-11-14 VITALS — BP 162/88 | HR 59 | Wt 138.0 lb

## 2016-11-14 DIAGNOSIS — I10 Essential (primary) hypertension: Secondary | ICD-10-CM | POA: Diagnosis not present

## 2016-11-14 LAB — BASIC METABOLIC PANEL
BUN / CREAT RATIO: 14 (ref 12–28)
BUN: 12 mg/dL (ref 8–27)
CALCIUM: 9.7 mg/dL (ref 8.7–10.3)
CO2: 25 mmol/L (ref 20–29)
CREATININE: 0.88 mg/dL (ref 0.57–1.00)
Chloride: 98 mmol/L (ref 96–106)
GFR, EST AFRICAN AMERICAN: 76 mL/min/{1.73_m2} (ref >59)
GFR, EST NON AFRICAN AMERICAN: 66 mL/min/{1.73_m2} (ref >59)
Glucose: 92 mg/dL (ref 65–99)
Potassium: 4.1 mmol/L (ref 3.5–5.2)
Sodium: 135 mmol/L (ref 134–144)

## 2016-11-14 NOTE — Progress Notes (Signed)
Patient ID: Wendy [name]                 DOB: 1945/10/21                      MRN: [id number]     HPI: Wendy [name] is a 71 y.o. female referred by Dr. Acie Fredrickson to HTN clinic. PMH is significant for HLD, HTN, IBS, malignant melanoma, diverticulosis of colon, GERD, carotid artery disease. Pt went to the ER in 12/2014 for elevated BP and was started on valsartan-amlodipine 160-5 mg 1/2 tab daily in addition to her existing BP medications. She was seen by Dr. Acie Fredrickson in 03/2016, and BP was 142/88. She was tolerating valsartan-amlodipine well at that time and current regimen was continued. Pt called in on 10/04/2016 to report that she has been affected by the valsartan recall and that she is feeling tired and experiencing ankle swelling. Valsartan-amlodipine was discontinued as a result and substituted with irbesartan 150 mg daily.   Pt presents today for f/u from Rx clinic (last seen 8/23), at that time, adjusted irbesartan to AM dosing for better BP control during the day and asked pt to bring BP readings from home. Did not increase dose based on patient preference. BMET obtained at that visit revealed SCr increase from 0.8 in Feb, 2018 to 1.04 (30% increase).   Pt states that she is continuing to tolerate irbesartan well. Taking irbesartan in the morning. Swelling in ankles resolved since d/c amlodipine. Denies dizziness, lightheadedness, fatigue. Took meds today, had coffee this morning.   Current HTN meds:  Dyazide 37.5-25mg  0.5 tab PRN, prescribed for fluid in ear, last dose was ~1 week ago Toprol XL 50 mg 1/2 tab daily (10 PM) Irbesartan 150 mg daily (Takes around 3 to 4 PM)  Previously tried: Valsartan-amlodipine 160-5 mg 1/2 tab daily - ankle swelling  BP goal: <130/80 mmHg  Family History:  Mother - diabetes, heart disease, HLD, HTN Father - cancer, DVT, MI Brother - cancer  Social History: Never smoked. Denies alcohol or illicit drug use.   Diet: Drinks half a cup of coffee in  the mornings occasionally. Does not add sodium to foods. Cooks more at home. Does not really eat too much processed meats. Likes to eat vegetables and fruits. No soda. Drinks mostly water.   Exercise: Likes to walk but has not been able to recently due to weather.   Home BP readings: (from evenings) 120-130s/70s-80s, excursions to SBP 154, 156, 162; DPB 92, 66, 69. HR 50s-low 60s  Wt Readings from Last 3 Encounters:  11/14/16 138 lb (62.6 kg)  03/30/16 136 lb 6.4 oz (61.9 kg)  02/15/15 132 lb 6.4 oz (60.1 kg)   BP Readings from Last 3 Encounters:  11/14/16 (!) 162/88  10/19/16 (!) 148/78  03/30/16 (!) 142/88   Pulse Readings from Last 3 Encounters:  11/14/16 (!) 59  10/19/16 (!) 55  03/30/16 65    Renal function: CrCl cannot be calculated (Patient's most recent lab result is older than the maximum 21 days allowed.).  Past Medical History:  Diagnosis Date  . Anxiety   . Atypical chest pain   . B12 deficiency   . Cervical spondylosis without myelopathy   . Diverticulosis of colon   . Dysesthesia   . GERD (gastroesophageal reflux disease)   . Hypercholesteremia   . Hypertension   . IBS (irritable bowel syndrome)   . Malignant melanoma ([place])   . Vitreous hemorrhage ([place])  Current Outpatient Prescriptions on File Prior to Visit  Medication Sig Dispense Refill  . irbesartan (AVAPRO) 150 MG tablet Take 1 tablet (150 mg total) by mouth daily. 30 tablet 11  . ALPRAZolam (XANAX) 0.5 MG tablet Take 0.5 mg by mouth at bedtime. 1/3 TABLET BY MOUTH AT BEDTIME FOR SLEEP    . aspirin 81 MG tablet Take 81 mg by mouth daily.    Marland Kitchen b complex vitamins tablet Take 1 tablet by mouth daily.    . Cholecalciferol (VITAMIN D) 2000 UNITS CAPS Take 1 capsule by mouth daily.    . Coenzyme Q10 (COQ10) 200 MG CAPS Take 1 capsule by mouth daily.    . Cranberry 400 MG CAPS Take 400 mg by mouth daily.    Marland Kitchen DIGEST ENZYMES-ANTICHOLINERGIC PO Take 1 capsule by mouth 3 (three) times daily.     Marland Kitchen  ibuprofen (ADVIL,MOTRIN) 200 MG tablet Take 200 mg by mouth every 6 (six) hours as needed for moderate pain.     . metoprolol succinate (TOPROL-XL) 25 MG 24 hr tablet TAKE 1 TABLET BY MOUTH DAILY 30 tablet 0  . Misc Natural Products (TART CHERRY ADVANCED) CAPS Take 2 capsules by mouth daily.    . Multiple Vitamins-Minerals (MULTIVITAMIN & MINERAL PO) Take 1 tablet by mouth daily.    . Omega-3 Fatty Acids (THE VERY FINEST FISH OIL) LIQD Take 1 1/2 tbsp daily    . Red Yeast Rice 600 MG CAPS Take 2 capsules by mouth daily.    Marland Kitchen triamterene-hydrochlorothiazide (DYAZIDE) 37.5-25 MG capsule Take 0.5 tablet by mouth once weekly.     No current facility-administered medications on file prior to visit.     Allergies  Allergen Reactions  . Hctz [Hydrochlorothiazide] Other (See Comments)    hyponatremia  . Codeine Nausea Only and Nausea And Vomiting  . Hydrocodone-Acetaminophen Nausea And Vomiting  . Lisinopril Other (See Comments)    REACTION: INTOL to Prinivil w/ pain on urination   . Neomycin Dermatitis  . Other Nausea Only    fragrance  . Oxycodone Nausea And Vomiting  . Pravastatin Sodium Other (See Comments)    REACTION: INTOL to Pravachol w/ myalgias     Assessment/Plan: # HTN: BP above goal of <130/80 both in office and roughly ~70% of the time at home.  BMET today. If SCr stable or lower, will increase irbesartan to 300 mg po daily based on patient preferece. If SCr increased will instead initiate nifedipine. Extensive discussion about risks of uncontrolled HTN, benefits of BP meds and control of BP with patient. Patient educated on purpose, proper use and potential adverse effects of irbesartan vs nifedipine.  Following instruction patient verbalized understanding of treatment plan.   Carlean Jews, Pharm.D. PGY2 Ambulatory Care Pharmacy Resident Phone: [phone]

## 2016-11-14 NOTE — Patient Instructions (Signed)
Thank you for coming to see [place] today.   1. BMET today 2. We will call you with a plan to adjust your blood pressure medications.  3. We will schedule f/u over the phone

## 2016-11-15 MED ORDER — IRBESARTAN 300 MG PO TABS: 300.0000 mg | ORAL_TABLET | Freq: Every day | ORAL | 3 refills | Status: DC

## 2016-11-15 NOTE — Progress Notes (Signed)
BMet with decrease in Scr and K from last check.   Will increase irbesartan to 300 mg daily.  Called patient to communicate change, patient verbalized understanding.   Follow up labs scheduled for 11/23/2016 for BMET Follow up BP check scheduled for 12/12/2016 with pharmacist   Carlean Jews, Pharm.D. PGY2 Ambulatory Care Pharmacy Resident Phone: [phone]

## 2016-11-16 ENCOUNTER — Ambulatory Visit: Payer: Medicare Other

## 2016-11-17 ENCOUNTER — Telehealth: Payer: Self-pay | Admitting: *Deleted

## 2016-11-17 ENCOUNTER — Telehealth: Payer: Self-pay | Admitting: Cardiovascular Disease

## 2016-11-17 NOTE — Telephone Encounter (Signed)
Patient stated that her blood pressure is still elevated. BP last night 160/100 & 165/90 this morning. Patient confirmed that she did increase her ibersartan to 300 mg daily and has continued to take all of her other medications the same. Patient said she is having back pain that she thinks could be contributing to the elevated BP. Patient advised that she needed to get tx for her back pain since this could cause her BP to be elevated. Patient also stated that she is using a different cuff to take her BP since her dose increase of avapro and she is unsure if it is accurate. Patient advised that she needed to have her BP machine checked for accuracy, checking her BP the same time each day preferably an hour after her BP meds, and using the same arm each time. Patient also advised to give her medication at least 1-1/2 week to take affect in lowering her BP. Denies dizziness,chest pain,sob. Patient verbalized understanding of plan.

## 2016-11-17 NOTE — Telephone Encounter (Signed)
-----   Message from Thayer Headings, MD sent at 11/15/2016  1:37 PM EDT ----- BMP looks good

## 2016-11-17 NOTE — Telephone Encounter (Signed)
New message    Pt is calling about her medication. She said she is trying the increased dose and her bp is still kind of high.   Pt c/o BP issue: STAT if pt c/o blurred vision, one-sided weakness or slurred speech  1. What are your last 5 BP readings? 160/100-last night 165/90 this morning  2. Are you having any other symptoms (ex. Dizziness, headache, blurred vision, passed out)? no  3. What is your BP issue? Pt is concerned about her bp

## 2016-11-21 ENCOUNTER — Telehealth: Payer: Self-pay | Admitting: Nurse Practitioner

## 2016-11-21 MED ORDER — OLMESARTAN MEDOXOMIL 20 MG PO TABS: 20.0000 mg | ORAL_TABLET | Freq: Every day | ORAL | 11 refills | Status: DC

## 2016-11-21 NOTE — Telephone Encounter (Signed)
Spoke with patient and advised her that Dr. Acie Fredrickson would like her to d/c the irbesartan and start olmesartan 40 mg. She asks if she can try a lower dose to start. I advised that I will order olmesartan 20 mg and for her to take for 1 week and monitor BP. I advised that if BP remains elevated, that she can increase to 40 mg daily. She is scheduled for bmet on 9/27 and follow-up in the hypertension clinic on 10/16. I advised that I will reschedule lab work for 10/16 on the same day as her f/u appointment. I advised her to call back sooner with questions or concerns. She verbalized understanding and agreement and thanked me for the call.

## 2016-11-21 NOTE — Telephone Encounter (Signed)
She might do better on Olmasartan  40 mg a day instead of the irbesartan. Check basic medical profile in 3 weeks.

## 2016-11-21 NOTE — Telephone Encounter (Signed)
Spoke with patient who complains of continued high BP. She has been treated in Hypertension Clinic and is currently taking Irbesartan 300 mg, Toprol 25 mg, and Dyazide 37.5/25 mg (1/2 tab) prn - took yesterday. She states she does not feel well and is waking up with headaches; states she can tell that her BP is not well controlled. States diastolic BP has not been <47 mmHg. I asked about hx of sleep apnea and she states she underwent a sleep study approximately 3 years ago and it was negative for sleep apnea. She states a pharmacist friend advised that diltiazem might be a good medication to add to her therapy with lower dose irbesartan. She states she took Amlodipine in the past and had ankle swelling. She states HCTZ at higher doses caused her to have hyponatremia. She would like a therapy that causes least side effects. I advised that I will forward message to our pharmacists and to Dr. Acie Fredrickson for advice and call her back later. She verbalized understanding and agreement and thanked me for the call.

## 2016-11-22 ENCOUNTER — Telehealth: Payer: Self-pay | Admitting: Cardiovascular Disease

## 2016-11-22 MED ORDER — LOSARTAN POTASSIUM 100 MG PO TABS: 100.0000 mg | ORAL_TABLET | Freq: Every day | ORAL | 11 refills | Status: DC

## 2016-11-22 NOTE — Telephone Encounter (Signed)
New message   Pt c/o medication issue:  1. Name of Medication:  olmesartan (BENICAR) 20 MG tablet Take 1 tablet (20 mg total) by mouth daily.   2. How are you currently taking this medication (dosage and times per day)20 mg  3. Are you having a reaction (difficulty breathing--STAT)no  4. What is your medication issue? Patient concerns about possible side effects and is unsure if she wants to take this medication.

## 2016-11-22 NOTE — Telephone Encounter (Signed)
Spoke with patient who states the olmesartan is going to cost her $73 per month and she is concerned about the side effects she has read about. I advised that this is the same class of medication as irbesartan and that we generally find that patients tolerate it well. I advised that we can try a different medication for cost purposes and we discussed several options. Her husband takes losartan so she agrees to try losartan 100 mg daily. I advised her to continue to monitor BP and to call back with questions or concerns. She has an appointment in hypertension clinic on 10/16 and I advised that if her BP remains elevated, the pharmacist can prescribe an additional medication. She verbalized understanding and agreement and thanked me for the call.

## 2016-11-23 ENCOUNTER — Other Ambulatory Visit: Payer: Medicare Other

## 2016-11-24 ENCOUNTER — Telehealth: Payer: Self-pay | Admitting: Cardiovascular Disease

## 2016-11-24 MED ORDER — SPIRONOLACTONE 25 MG PO TABS: 12.5000 mg | ORAL_TABLET | Freq: Every day | ORAL | 3 refills | Status: DC

## 2016-11-24 MED ORDER — LOSARTAN POTASSIUM 100 MG PO TABS: 50.0000 mg | ORAL_TABLET | Freq: Every day | ORAL | 3 refills | Status: DC

## 2016-11-24 NOTE — Telephone Encounter (Signed)
Continue Losartan 50 mg  ( 1/2 of her current dose)  DC MAxzide Start Aldactone 25 mg a day  Check BMP in 1 week

## 2016-11-24 NOTE — Telephone Encounter (Signed)
Patient informed by Christen Bame on 11/21/16.

## 2016-11-24 NOTE — Telephone Encounter (Signed)
Spoke with patient who states she took losartan 100 mg and had h/a, body aches, felt like ears were full and "head was in a fog." She states she also had bilateral leg cramps. She states BP yesterday afternoon, several hours after taking medication was 169/95 mmHg. She reports that she took losartan 50 mg this am at approximately 0930. I had her take her BP while we were on the phone and she reports 194/104 mmHg. I reviewed proper BP technique with her and she verbalized agreement with proper procedure. We discussed hydration and she admits that she probably does not drink enough water. I encouraged her to drink at least 64 oz of water daily, especially with this medication. She denies high sodium diet. She is a very pleasant person and verbalized frustration with the continued high BP; states she wants to follow our advice she just does not understand why the medication is not working. I advised that I will review with Dr. Acie Fredrickson and call her back before I leave today. She was very grateful for the call.

## 2016-11-24 NOTE — Telephone Encounter (Signed)
Spoke with patient to discuss Dr. Elmarie Shiley advice with her. She states she took 2 full doses of Losartan 100 mg prior to today and took 50 mg today. She states she has felt better today. I explained the change from Dyazide (Triamterene/HCTZ) to Spironolactone. She would like to start with a lower dose. I advised that she can take spironolactone 12.5 mg to start. She understands to stop Dyazide. I advised her to continue good hydration and to limit sodium intake to 2000 g per day. I advised that I will call her Monday to assess how she is doing. She will start spironolactone 12.5 mg tomorrow. She was very grateful for the call.

## 2016-11-24 NOTE — Telephone Encounter (Signed)
Wendy [name] is calling in reference to her mediations in which she is trying to tolerate . Please call

## 2016-11-26 NOTE — Telephone Encounter (Signed)
Agree with note by Michelle Swinyer, RN  

## 2016-11-27 ENCOUNTER — Telehealth: Payer: Self-pay | Admitting: Cardiovascular Disease

## 2016-11-27 NOTE — Telephone Encounter (Signed)
New Message  Pt call requesting to speak with Rn . Pt states the new bp medication is no working. She states her bp has not changed and she would like to speak with RN. Please call back to discuss

## 2016-11-27 NOTE — Telephone Encounter (Signed)
Spoke with patient who is concerned that her BP remains high. She states she took spironolactone 12.5 mg twice on Saturday and Sunday - causing "hot flashes" and headache. Reports BP yesterday at 1000 was 178/102 mmHg and then later 166/106 mmHg. She reports HR has been 60's to 70's BPM. She states BP this morning after taking Losartan 50 mg was 169/91 mmHg. She did not take the spironolactone this morning for no particular reason.  I asked her to put me on hold to recheck BP and it is currently 154/94 mmHg, HR 67. Patient verbalized excitement that this was a better BP reading than she got this morning. I encouraged her to take the Losartan 50 mg and Spirolonactone 12.5 mg each morning and continue to monitor BP and HR. I advised her that we need 5 days of medical therapy to further assess effectiveness of the medicine regimen. She verbalized understanding and agreement with plan and thanked me for the call.

## 2016-11-28 DIAGNOSIS — Z6826 Body mass index (BMI) 26.0-26.9, adult: Secondary | ICD-10-CM | POA: Diagnosis not present

## 2016-11-28 DIAGNOSIS — I10 Essential (primary) hypertension: Secondary | ICD-10-CM | POA: Diagnosis not present

## 2016-11-28 DIAGNOSIS — C4330 Malignant melanoma of unspecified part of face: Secondary | ICD-10-CM | POA: Diagnosis not present

## 2016-11-28 DIAGNOSIS — E871 Hypo-osmolality and hyponatremia: Secondary | ICD-10-CM | POA: Diagnosis not present

## 2016-11-28 DIAGNOSIS — E7849 Other hyperlipidemia: Secondary | ICD-10-CM | POA: Diagnosis not present

## 2016-11-28 DIAGNOSIS — I779 Disorder of arteries and arterioles, unspecified: Secondary | ICD-10-CM | POA: Diagnosis not present

## 2016-11-28 DIAGNOSIS — D126 Benign neoplasm of colon, unspecified: Secondary | ICD-10-CM | POA: Diagnosis not present

## 2016-11-28 DIAGNOSIS — K589 Irritable bowel syndrome without diarrhea: Secondary | ICD-10-CM | POA: Diagnosis not present

## 2016-11-28 DIAGNOSIS — Z0000 Encounter for general adult medical examination without abnormal findings: Secondary | ICD-10-CM | POA: Diagnosis not present

## 2016-11-28 DIAGNOSIS — E538 Deficiency of other specified B group vitamins: Secondary | ICD-10-CM | POA: Diagnosis not present

## 2016-11-28 DIAGNOSIS — N390 Urinary tract infection, site not specified: Secondary | ICD-10-CM | POA: Diagnosis not present

## 2016-11-28 DIAGNOSIS — Z23 Encounter for immunization: Secondary | ICD-10-CM | POA: Diagnosis not present

## 2016-11-28 DIAGNOSIS — F419 Anxiety disorder, unspecified: Secondary | ICD-10-CM | POA: Diagnosis not present

## 2016-11-28 DIAGNOSIS — Z1389 Encounter for screening for other disorder: Secondary | ICD-10-CM | POA: Diagnosis not present

## 2016-11-28 DIAGNOSIS — K219 Gastro-esophageal reflux disease without esophagitis: Secondary | ICD-10-CM | POA: Diagnosis not present

## 2016-11-29 ENCOUNTER — Telehealth: Payer: Self-pay | Admitting: Pharmacist

## 2016-11-29 NOTE — Telephone Encounter (Signed)
Called patient to follow up on BP after most recent visit with Rx Clinic 11/14/2016. Patient states that she saw Dr. Forde Dandy (endocrinologist who agreed to see her as PCP as he cared for some of her family members, per patient) yesterday. He increased losartan to 50 mg BID as BP was still uncontrolled. Patient reports her BP this morning was 138/92 which is improved from previous. Endorses adherence to metoprolol succinate, spironolactone and d/c of triamterene/HCTZ. Denies side effects, syncope, dizziness, CP at this time.   Patient additionally brings up concerns regarding ADE about olmesartan, a drug that was previously prescribed, that she read about on the internet. I had a long discussion with patient regarding common and rare ADE with olmesartan. Advised patient to call a health care provider with future questions.   Patient has follow up appointment with Rx Clinic in ~ 2 weeks. Asked patient to call back to clinic if SBP was in 150s-160s again.  Patient verbalized understanding and expressed thanks.   Carlean Jews, Pharm.D. PGY2 Ambulatory Care Pharmacy Resident Phone: [phone]

## 2016-11-29 NOTE — Patient Outreach (Signed)
Opened in error under incorrect context

## 2016-11-29 NOTE — Telephone Encounter (Signed)
Noted - pt to continue on current BP regimen and f/u in HTN clinic as scheduled.

## 2016-11-30 ENCOUNTER — Telehealth: Payer: Self-pay | Admitting: Cardiovascular Disease

## 2016-11-30 NOTE — Telephone Encounter (Signed)
Spoke with patient who states she had a good day yesterday until last night when she developed a pain in her neck and a headache up the back of her head and up the side of her head. She states prior to this she had felt the best she had felt in a long time. [place] and was using a different BP cuff . She reports readings of 168/135 mmHg and 202/114 mmHg. She states she took 0.25 mg Xanax last night to help her calm down and she slept well. This morning at home, she reports her BP was 150/91 mmHg and 132/79 mmHg. I advised that the readings from last night could have been due to malfunctioning equipment. I encouraged her to take the losartan 50 mg twice daily and the spironolactone 25 mg each morning consistently for the next week and monitor her BP. I advised that it will be best for [place] to be able to recommend adequate therapy if she is consistent in taking her medications and gives [place] readings from home.  She has an appointment in hypertension clinic on 10/16. I advised that if she returns to the beach to take her home monitor for comparison and to put fresh batteries in the machine at the beach. She verbalized understanding and agreement and thanked me for the call.

## 2016-11-30 NOTE — Telephone Encounter (Signed)
New message    Pt is calling asking for a call back. She said her bp spiked last night. She said she had a xanax this morning and she is alright. swiny  Pt c/o BP issue: STAT if pt c/o blurred vision, one-sided weakness or slurred speech  1. What are your last 5 BP readings? 202/114  2. Are you having any other symptoms (ex. Dizziness, headache, blurred vision, passed out)? headache  3. What is your BP issue? Last night pt bp spiked.

## 2016-12-11 ENCOUNTER — Telehealth: Payer: Self-pay | Admitting: Cardiovascular Disease

## 2016-12-11 NOTE — Progress Notes (Signed)
Patient ID: ILAMAE GENG                 DOB: December 31, 1945                      MRN: [id number]     HPI: Kailie Polus Shoffneris a 71 y.o.femalereferred by Dr. Penny Pia HTN clinic.PMH is significant for HLD, HTN, IBS, malignant melanoma, diverticulosis of colon, GERD, carotid artery disease. Pt went to the ER in 12/2014 for elevated BP and was started on valsartan-amlodipine 160-5 mg 1/2 tab daily in addition to her existing BP medications. She was seen by Dr. Acie Fredrickson in 03/2016, and BP was 142/88. She was tolerating valsartan-amlodipine well at that time and current regimen was continued. Pt called in on 10/04/2016 to report that she has been affected by the valsartan recall and that she was feeling tired and experiencing ankle swelling. Valsartan-amlodipine was discontinued as a result and substituted with irbesartan 150 mg daily. At last Rx clinic visit, BP remained elevated and irbesartan was increased to 300 mg daily.   Since last Rx clinic visit, patient has been extremely concerned about BPs and several changes to BP meds have occurred:  11/21/2016 - Patient called reporting HA and "not feeling well" - irbesartan d/c'd, olmesartan 40 mg daily started 11/22/2016 - Olmesartan found to be cost prohibitive and patient fearful of potential ADE. Husband is on losartan, patient requests we switch to losartan - Olmesartan d/c'd, losartan 100 mg daily started 11/24/2016 - Patient called clinic reporting HA, body/leg aches, ear fullness and "head in a fog". Losartan was decreased to 50 mg, dyazide was d/c'd and spironolactone 12.5 mg (titrating to 25 mg daily) was started.   11/27/2016 - Patient called concerned for high BPs. Reports spironolactone 12.5 mg BID caused hot flashes and headache. Admitted to some medication nonadherence. Losartan 50 mg daily and spironolactone 12.5 mg daily continued.  11/29/2016 - Reached out to patient to check on her - reports that losartan was increased to 50 mg BID by endocrinologist  (PCP). Reports improved BPs and adherence prescribed regimen. 11/30/2016 - Pt called concerned over BP spike as measured on home cuff - She was advised to take losartan 50 BID and spironolactone 25 mg daily and keep follow up appointment.  12/11/2016 - Patient called concerned over BPs - was encouaraged to remain adherent to medication and bring in BP cuff for check at office visit scheduled for 12/12/2016.   Pt presents today for f/u from Rx clinic on 11/14/2016. Reports that she is feeling better than she has in a while. Has not read about any side effects of new medications and has tried not to worry about them. Denies CP, syncope, dizziness. Wants to lose weight, goal weight =127 lbs, has gotten back into walking. Reports that her BP was very high at the beach to the point that she took a Xanax. Has been taking losartan 50 mg BID and spironolactone 12.5 mg daily more often than 25 mg daily. Thinks that higher dose of spironolactone was causing a  "roaring" in her ears, however upon further questioning, patient reports this has been happening for several months.   Brings home BP cuff with her to this visit and cuff readings appear accurate:  Home BP cuff on right arm = 147/88; HR = 64 Home BP cuff on left arm = 143/89; HR = 65   Current HTN meds: Toprol XL 50 mg 1/2 tab daily (10 PM) Spironolactone 12.5-25 mg daily (  will usually take 12.5 mg daily)  Losartan 50 mg BID  Previously tried:Valsartan-amlodipine 160-5 mg 1/2 tab daily - ankle swelling, Dyazide 37.5-25mg  0.5 tab PRN for fluid in ears (d/c'd when spironolactone started), irbesartan (ineffective), olmesartan (expensive, fearful of ADE)  BP goal: <130/80 mmHg  Family History:  Mother - diabetes, heart disease, HLD, HTN Father - cancer, DVT, MI Brother - cancer  Social History: Never smoked. Denies alcohol or illicit drug use.   Diet:Drinks half a cup of coffee in the mornings occasionally. Does add a small amount of salt when  cooking food. Cooks more at home. Eats canned vegetables "all the time".  Does not really eat too much processed meats. Likes to eat vegetables and fruits. No soda. Drinks mostly water.   Exercise: Has started back walking 30-50 minutes 3x/week  Home BP readings: 140s-160s/80s-90s, excursions to 136/80; HR in high 50s-60s  Wt Readings from Last 3 Encounters:  12/12/16 138 lb 6.4 oz (62.8 kg)  11/14/16 138 lb (62.6 kg)  03/30/16 136 lb 6.4 oz (61.9 kg)   BP Readings from Last 3 Encounters:  12/12/16 (!) 146/88  11/14/16 (!) 162/88  10/19/16 (!) 148/78   Pulse Readings from Last 3 Encounters:  12/12/16 64  11/14/16 (!) 59  10/19/16 (!) 55    Renal function: CrCl cannot be calculated (Patient's most recent lab result is older than the maximum 21 days allowed.).  Past Medical History:  Diagnosis Date  . Anxiety   . Atypical chest pain   . B12 deficiency   . Cervical spondylosis without myelopathy   . Diverticulosis of colon   . Dysesthesia   . GERD (gastroesophageal reflux disease)   . Hypercholesteremia   . Hypertension   . IBS (irritable bowel syndrome)   . Malignant melanoma ([place])   . Vitreous hemorrhage ([place])     Current Outpatient Prescriptions on File Prior to Visit  Medication Sig Dispense Refill  . ALPRAZolam (XANAX) 0.5 MG tablet Take 0.5 mg by mouth at bedtime. 1/3 TABLET BY MOUTH AT BEDTIME FOR SLEEP    . aspirin 81 MG tablet Take 81 mg by mouth daily.    Marland Kitchen b complex vitamins tablet Take 1 tablet by mouth daily.    . Cholecalciferol (VITAMIN D) 2000 UNITS CAPS Take 1 capsule by mouth daily.    . Coenzyme Q10 (COQ10) 200 MG CAPS Take 1 capsule by mouth daily.    . Cranberry 400 MG CAPS Take 400 mg by mouth daily.    Marland Kitchen DIGEST ENZYMES-ANTICHOLINERGIC PO Take 1 capsule by mouth 3 (three) times daily.     Marland Kitchen ibuprofen (ADVIL,MOTRIN) 200 MG tablet Take 200 mg by mouth every 6 (six) hours as needed for moderate pain.     Marland Kitchen losartan (COZAAR) 100 MG tablet Take 0.5  tablets (50 mg total) by mouth daily. (Patient taking differently: Take 50 mg by mouth 2 (two) times daily. ) 90 tablet 3  . metoprolol succinate (TOPROL-XL) 25 MG 24 hr tablet TAKE 1 TABLET BY MOUTH DAILY 30 tablet 0  . Misc Natural Products (TART CHERRY ADVANCED) CAPS Take 2 capsules by mouth daily.    . Multiple Vitamins-Minerals (MULTIVITAMIN & MINERAL PO) Take 1 tablet by mouth daily.    . Omega-3 Fatty Acids (THE VERY FINEST FISH OIL) LIQD Take 1 1/2 tbsp daily    . Red Yeast Rice 600 MG CAPS Take 2 capsules by mouth daily.    Marland Kitchen spironolactone (ALDACTONE) 25 MG tablet Take 0.5 tablets (12.5  mg total) by mouth daily. 45 tablet 3   No current facility-administered medications on file prior to visit.     Allergies  Allergen Reactions  . Hctz [Hydrochlorothiazide] Other (See Comments)    hyponatremia  . Codeine Nausea Only and Nausea And Vomiting  . Hydrocodone-Acetaminophen Nausea And Vomiting  . Lisinopril Other (See Comments)    REACTION: INTOL to Prinivil w/ pain on urination   . Neomycin Dermatitis  . Other Nausea Only    fragrance  . Oxycodone Nausea And Vomiting  . Pravastatin Sodium Other (See Comments)    REACTION: INTOL to Pravachol w/ myalgias     Assessment/Plan:  1. Hypertension - Uncontrolled, above goal of <130/80 mmHg. Tolerating medications well however very anxious about changes to medications, therefore has not previously implemented suggested dose of spironolactone.  - Increase spironolactone to 25 mg daily - Continue other medications, long discussion had about side effects and mechanisms of medications - Counseled on restricting canned vegetables and packaged snacks - BMET today, will follow up on results this afternoon or tomorrow and call patient with results - BMET scheduled for 12/25/2016, f/u with HTN clinic on 01/01/2017  Carlean Jews, Pharm.D. PGY2 Ambulatory Care Pharmacy Resident Phone: [phone]

## 2016-12-11 NOTE — Telephone Encounter (Signed)
Spoke with patient who states she is coming in tomorrow for appointment in hypertension clinic. She reports that BP remains high. She has been monitoring her BP at home and I advised her to bring her home cuff and her readings.  She reports high readings when she wakes up in the morning. I asked her if she is monitoring BP at least 30 minutes after she eats breakfast and takes her morning medications. She states she remembers me telling her that in the past and she has been checking BP later in the day but has not rechecked today. She reports BP reading of 143/92 mmHg last night at approximately 9:30 pm after taking losartan 50 mg at 6:30 pm. I advised her to bring her monitor for comparison to ours and that the pharmacist will review all of this information tomorrow at her appointment. She verbalized understanding and agreement and thanked me for the call.

## 2016-12-11 NOTE — Telephone Encounter (Signed)
New Message     Pt c/o BP issue: STAT if pt c/o blurred vision, one-sided weakness or slurred speech  1. What are your last 5 BP readings? 140-155/90-100  2. Are you having any other symptoms (ex. Dizziness, headache, blurred vision, passed out)? no  3. What is your BP issue? Doesn't feel that bp is coming down

## 2016-12-12 ENCOUNTER — Telehealth: Payer: Self-pay | Admitting: Pharmacist

## 2016-12-12 ENCOUNTER — Other Ambulatory Visit: Payer: Medicare Other | Admitting: *Deleted

## 2016-12-12 ENCOUNTER — Ambulatory Visit (INDEPENDENT_AMBULATORY_CARE_PROVIDER_SITE_OTHER): Payer: Medicare Other | Admitting: Pharmacist

## 2016-12-12 DIAGNOSIS — C4330 Malignant melanoma of unspecified part of face: Secondary | ICD-10-CM | POA: Diagnosis not present

## 2016-12-12 DIAGNOSIS — I10 Essential (primary) hypertension: Secondary | ICD-10-CM

## 2016-12-12 DIAGNOSIS — E871 Hypo-osmolality and hyponatremia: Secondary | ICD-10-CM | POA: Diagnosis not present

## 2016-12-12 DIAGNOSIS — Z6826 Body mass index (BMI) 26.0-26.9, adult: Secondary | ICD-10-CM | POA: Diagnosis not present

## 2016-12-12 LAB — BASIC METABOLIC PANEL
BUN/Creatinine Ratio: 18 (ref 12–28)
BUN: 13 mg/dL (ref 8–27)
CALCIUM: 9.7 mg/dL (ref 8.7–10.3)
CHLORIDE: 95 mmol/L — AB (ref 96–106)
CO2: 25 mmol/L (ref 20–29)
Creatinine, Ser: 0.73 mg/dL (ref 0.57–1.00)
GFR calc Af Amer: 96 mL/min/{1.73_m2} (ref >59)
GFR calc non Af Amer: 83 mL/min/{1.73_m2} (ref >59)
Glucose: 82 mg/dL (ref 65–99)
Potassium: 4.8 mmol/L (ref 3.5–5.2)
Sodium: 133 mmol/L — ABNORMAL LOW (ref 134–144)

## 2016-12-12 MED ORDER — SPIRONOLACTONE 25 MG PO TABS: 25.0000 mg | ORAL_TABLET | Freq: Every day | ORAL | 3 refills | Status: DC

## 2016-12-12 NOTE — Telephone Encounter (Signed)
Called patient at her request after BMET results available. Left HIPAA-Compliant VM.

## 2016-12-12 NOTE — Telephone Encounter (Signed)
Pt returned call. Informed her that BMET looks stable and no changes will be made. Patient expressed understanding and thanks.   Carlean Jews, Pharm.D. PGY2 Ambulatory Care Pharmacy Resident Phone: [phone]

## 2016-12-12 NOTE — Patient Instructions (Addendum)
Thanks for coming to see me!   1. Take 1 full tablet of the spironolactone 25 mg once a day in the mornings.   2. Continue losartan 50 mg twice a day  3. I will call you with your lab results  Come back for lab work on 12/25/2016 and back for pharmacy visit on 01/01/2017. Bring your blood pressure log with you.

## 2016-12-15 ENCOUNTER — Telehealth: Payer: Self-pay

## 2016-12-15 NOTE — Telephone Encounter (Signed)
spoke with patient about recent lab results. patient verbalized understanding. patient thanked me for my call. 

## 2016-12-22 DIAGNOSIS — L578 Other skin changes due to chronic exposure to nonionizing radiation: Secondary | ICD-10-CM | POA: Diagnosis not present

## 2016-12-22 DIAGNOSIS — Z85820 Personal history of malignant melanoma of skin: Secondary | ICD-10-CM | POA: Diagnosis not present

## 2016-12-22 DIAGNOSIS — L570 Actinic keratosis: Secondary | ICD-10-CM | POA: Diagnosis not present

## 2016-12-22 DIAGNOSIS — D1801 Hemangioma of skin and subcutaneous tissue: Secondary | ICD-10-CM | POA: Diagnosis not present

## 2016-12-22 DIAGNOSIS — L814 Other melanin hyperpigmentation: Secondary | ICD-10-CM | POA: Diagnosis not present

## 2016-12-22 DIAGNOSIS — L821 Other seborrheic keratosis: Secondary | ICD-10-CM | POA: Diagnosis not present

## 2016-12-22 DIAGNOSIS — Z85828 Personal history of other malignant neoplasm of skin: Secondary | ICD-10-CM | POA: Diagnosis not present

## 2016-12-22 DIAGNOSIS — D225 Melanocytic nevi of trunk: Secondary | ICD-10-CM | POA: Diagnosis not present

## 2016-12-25 ENCOUNTER — Other Ambulatory Visit: Payer: Medicare Other

## 2016-12-25 DIAGNOSIS — I10 Essential (primary) hypertension: Secondary | ICD-10-CM | POA: Diagnosis not present

## 2016-12-26 ENCOUNTER — Telehealth: Payer: Self-pay | Admitting: Pharmacist

## 2016-12-26 LAB — BASIC METABOLIC PANEL
BUN / CREAT RATIO: 15 (ref 12–28)
BUN: 13 mg/dL (ref 8–27)
CO2: 22 mmol/L (ref 20–29)
Calcium: 9.5 mg/dL (ref 8.7–10.3)
Chloride: 97 mmol/L (ref 96–106)
Creatinine, Ser: 0.84 mg/dL (ref 0.57–1.00)
GFR, EST AFRICAN AMERICAN: 81 mL/min/{1.73_m2} (ref >59)
GFR, EST NON AFRICAN AMERICAN: 70 mL/min/{1.73_m2} (ref >59)
Glucose: 91 mg/dL (ref 65–99)
POTASSIUM: 4.6 mmol/L (ref 3.5–5.2)
SODIUM: 135 mmol/L (ref 134–144)

## 2016-12-26 NOTE — Telephone Encounter (Signed)
Called patient to follow up on BMET results from 12/25/2016. K wnl and Scr with slight increase of ~15%, stable from previous.   No answer, left HIPAA-compliant VM.   Carlean Jews, Pharm.D. PGY2 Ambulatory Care Pharmacy Resident Phone: [phone]

## 2016-12-27 NOTE — Telephone Encounter (Signed)
Patient returned phone call - results explained to her. Pt reports that BP has been much improved into 120s-130s/70s-80s however she has had some higher in the last 3 days. Confirmed compliance with medications. Pt requests reschedule of upcoming HTN clinic visit - rescheduled for 01/16/2017 afternoon.   Carlean Jews, Pharm.D. PGY2 Ambulatory Care Pharmacy Resident Phone: [phone]

## 2017-01-01 ENCOUNTER — Ambulatory Visit: Payer: Medicare Other

## 2017-01-04 DIAGNOSIS — H9041 Sensorineural hearing loss, unilateral, right ear, with unrestricted hearing on the contralateral side: Secondary | ICD-10-CM | POA: Diagnosis not present

## 2017-01-04 DIAGNOSIS — H833X1 Noise effects on right inner ear: Secondary | ICD-10-CM | POA: Diagnosis not present

## 2017-01-11 ENCOUNTER — Ambulatory Visit (INDEPENDENT_AMBULATORY_CARE_PROVIDER_SITE_OTHER): Payer: Medicare Other | Admitting: Pharmacist

## 2017-01-11 VITALS — BP 142/80 | HR 59

## 2017-01-11 DIAGNOSIS — I10 Essential (primary) hypertension: Secondary | ICD-10-CM

## 2017-01-11 MED ORDER — SPIRONOLACTONE 25 MG PO TABS
25.0000 mg | ORAL_TABLET | Freq: Two times a day (BID) | ORAL | 3 refills | Status: DC
Start: 2017-01-11 — End: 2017-02-13

## 2017-01-11 NOTE — Patient Instructions (Signed)
It was great to meet you. Your blood pressure looks better!  Continue taking metoprolol succinate 12.5mg  daily and losartan 50mg  twice daily.   Increase your spironolactone to 25mg  twice daily.  We will repeat blood work in 1 week. Return to clinic in 4 weeks for a blood pressure check.

## 2017-01-11 NOTE — Progress Notes (Signed)
Patient ID: Wendy [name]                 DOB: 07-09-1945                      MRN: [id number]     HPI: Wendy [name] is a 71 y.o. female referred by Dr. Penny Pia HTN clinic.PMH is significant for HLD, HTN, IBS, malignant melanoma, diverticulosis of colon, GERD, carotid artery disease. Pt went to the ER in 12/2014 for elevated BP and was started on valsartan-amlodipine 160-5 mg 1/2 tab daily in addition to her existing BP medications. She was seen by Dr. Acie Fredrickson in 03/2016, and BP was 142/88. She was tolerating valsartan-amlodipine well at that time and current regimen was continued. Pt called in on 10/04/2016 to report that she has been affected by the valsartan recall and that she was feeling tired and experiencing ankle swelling. Valsartan-amlodipine was discontinued as a result and substituted with irbesartan 150 mg daily. At last Rx clinic visit, BP remained elevated and irbesartan was increased to 300 mg daily. Pt then reported "not feeling well" so irbesartan was transitioned to olmesartan. Olmesartan was found to be cost prohibitive and pt was fearful of ADE so losartan was initiated. Pt then experienced body aches and ear fullness so losartan dose was reduced from 100mg  to 50mg , Dyazide was D/C'd, and spironolactone was started. At last HTN clinic appointment 10/16 pt reported spironolactone 25mg  daily caused "roaring" in her ears so she had mostly been taking 12.5mg  daily. Advised pt to continue to take 25mg  daily at that time and BMET repeat remained stable.  Pt presents ambulating in good spirits. Pt reports that her BP was well-controlled a few weeks ago in 120s after receiving acupuncture but now at home the readings are increasing to the 150s. Pt states that she has been feeling "fairly good" and feels better whenever she walks which has been limited by recent poor weather. Pt endorses eating out more frequently recently with more sweets and Na intake. Pt notes compliance with spironolactone  25mg  daily but has been splitting it into 12.5mg  BID. She also reports taking only metoprolol 12.5mg  daily and having done so "for awhile."  Current HTN meds: -Toprol XL 25mg  daily (10 PM) (pt reports taking 12.5mg ) -Spironolactone 12.5mg  BID -Losartan 50 mg BID  Previously tried:Valsartan-amlodipine 160-5 mg 1/2 tab daily (ankle swelling), Dyazide 37.5-25mg  0.5 tab PRN for fluid in ears (d/c'd when spironolactone started), irbesartan ("not feeling well"), olmesartan (expensive, fearful of ADE)  BP goal: <130/80 mmHg  Family History:  Mother - diabetes, heart disease, HLD, HTN Father - cancer, DVT, MI Brother - cancer  Social History: Never smoked. Denies alcohol or illicit drug use.   Diet:Drinks half a cup of coffee in the mornings occasionally. Does add a small amount of salt when cooking food. Cooks more at home. Eats canned vegetables "all the time".  Does not really eat too much processed meats. Likes to eat vegetables and fruits. No soda. Drinks mostly water.   Exercise: Has started back walking 30-50 minutes 3x/week   Home BP readings: 150s/90s (no log available)  Wt Readings from Last 3 Encounters:  12/12/16 138 lb 6.4 oz (62.8 kg)  11/14/16 138 lb (62.6 kg)  03/30/16 136 lb 6.4 oz (61.9 kg)   BP Readings from Last 3 Encounters:  12/12/16 (!) 146/88  11/14/16 (!) 162/88  10/19/16 (!) 148/78   Pulse Readings from Last 3 Encounters:  12/12/16 64  11/14/16 (!) 59  10/19/16 (!) 55    Renal function: CrCl cannot be calculated (Unknown ideal weight.).  Past Medical History:  Diagnosis Date  . Anxiety   . Atypical chest pain   . B12 deficiency   . Cervical spondylosis without myelopathy   . Diverticulosis of colon   . Dysesthesia   . GERD (gastroesophageal reflux disease)   . Hypercholesteremia   . Hypertension   . IBS (irritable bowel syndrome)   . Malignant melanoma ([place])   . Vitreous hemorrhage ([place])     Current Outpatient Medications on File Prior  to Visit  Medication Sig Dispense Refill  . ALPRAZolam (XANAX) 0.5 MG tablet Take 0.5 mg by mouth at bedtime. 1/3 TABLET BY MOUTH AT BEDTIME FOR SLEEP    . aspirin 81 MG tablet Take 81 mg by mouth daily.    Marland Kitchen b complex vitamins tablet Take 1 tablet by mouth daily.    . Cholecalciferol (VITAMIN D) 2000 UNITS CAPS Take 1 capsule by mouth daily.    . Coenzyme Q10 (COQ10) 200 MG CAPS Take 1 capsule by mouth daily.    . Cranberry 400 MG CAPS Take 400 mg by mouth daily.    Marland Kitchen DIGEST ENZYMES-ANTICHOLINERGIC PO Take 1 capsule by mouth 3 (three) times daily.     Marland Kitchen ibuprofen (ADVIL,MOTRIN) 200 MG tablet Take 200 mg by mouth every 6 (six) hours as needed for moderate pain.     Marland Kitchen losartan (COZAAR) 100 MG tablet Take 0.5 tablets (50 mg total) by mouth daily. (Patient taking differently: Take 50 mg by mouth 2 (two) times daily. ) 90 tablet 3  . metoprolol succinate (TOPROL-XL) 25 MG 24 hr tablet TAKE 1 TABLET BY MOUTH DAILY 30 tablet 0  . Misc Natural Products (TART CHERRY ADVANCED) CAPS Take 2 capsules by mouth daily.    . Multiple Vitamins-Minerals (MULTIVITAMIN & MINERAL PO) Take 1 tablet by mouth daily.    . Omega-3 Fatty Acids (THE VERY FINEST FISH OIL) LIQD Take 1 1/2 tbsp daily    . Red Yeast Rice 600 MG CAPS Take 2 capsules by mouth daily.    Marland Kitchen spironolactone (ALDACTONE) 25 MG tablet Take 1 tablet (25 mg total) by mouth daily. 90 tablet 3   No current facility-administered medications on file prior to visit.     Allergies  Allergen Reactions  . Hctz [Hydrochlorothiazide] Other (See Comments)    hyponatremia  . Codeine Nausea Only and Nausea And Vomiting  . Hydrocodone-Acetaminophen Nausea And Vomiting  . Lisinopril Other (See Comments)    REACTION: INTOL to Prinivil w/ pain on urination   . Neomycin Dermatitis  . Other Nausea Only    fragrance  . Oxycodone Nausea And Vomiting  . Pravastatin Sodium Other (See Comments)    REACTION: INTOL to Pravachol w/ myalgias      Assessment/Plan:  1. Hypertension - BP uncontrolled above goal <130/80 mmHg but improving with higher dose of spironolactone. BMET has remained stable, will continue to titrate spironolactone up to 25mg  BID and recheck BMET in 10 days. Will continue metoprolol XL 12.5mg  daily and losartan 50mg  BID. Encouraged compliance with prescribed doses and advised pt to continue to check BP at home and limit salt intake. Will return to clinic in 4 weeks for BP F/U.  Arrie Senate, PharmD PGY-2 Cardiology Pharmacy Resident Pager: [phone] 01/11/2017

## 2017-01-16 ENCOUNTER — Ambulatory Visit: Payer: Medicare Other

## 2017-01-23 ENCOUNTER — Other Ambulatory Visit: Payer: Medicare Other

## 2017-01-25 ENCOUNTER — Other Ambulatory Visit: Payer: Medicare Other | Admitting: *Deleted

## 2017-01-25 ENCOUNTER — Encounter (INDEPENDENT_AMBULATORY_CARE_PROVIDER_SITE_OTHER): Payer: Self-pay

## 2017-01-25 DIAGNOSIS — H9311 Tinnitus, right ear: Secondary | ICD-10-CM | POA: Diagnosis not present

## 2017-01-25 DIAGNOSIS — I10 Essential (primary) hypertension: Secondary | ICD-10-CM

## 2017-01-25 DIAGNOSIS — M542 Cervicalgia: Secondary | ICD-10-CM | POA: Diagnosis not present

## 2017-01-25 DIAGNOSIS — H6123 Impacted cerumen, bilateral: Secondary | ICD-10-CM | POA: Diagnosis not present

## 2017-01-25 DIAGNOSIS — H833X1 Noise effects on right inner ear: Secondary | ICD-10-CM | POA: Diagnosis not present

## 2017-01-25 DIAGNOSIS — H9041 Sensorineural hearing loss, unilateral, right ear, with unrestricted hearing on the contralateral side: Secondary | ICD-10-CM | POA: Diagnosis not present

## 2017-01-25 DIAGNOSIS — M5136 Other intervertebral disc degeneration, lumbar region: Secondary | ICD-10-CM | POA: Diagnosis not present

## 2017-01-26 LAB — BASIC METABOLIC PANEL
BUN/Creatinine Ratio: 15 (ref 12–28)
BUN: 15 mg/dL (ref 8–27)
CO2: 25 mmol/L (ref 20–29)
CREATININE: 0.98 mg/dL (ref 0.57–1.00)
Calcium: 9.8 mg/dL (ref 8.7–10.3)
Chloride: 92 mmol/L — ABNORMAL LOW (ref 96–106)
GFR, EST AFRICAN AMERICAN: 67 mL/min/{1.73_m2} (ref >59)
GFR, EST NON AFRICAN AMERICAN: 58 mL/min/{1.73_m2} — AB (ref >59)
Glucose: 86 mg/dL (ref 65–99)
Potassium: 5 mmol/L (ref 3.5–5.2)
SODIUM: 133 mmol/L — AB (ref 134–144)

## 2017-01-30 DIAGNOSIS — M545 Low back pain: Secondary | ICD-10-CM | POA: Diagnosis not present

## 2017-02-01 DIAGNOSIS — M5106 Intervertebral disc disorders with myelopathy, lumbar region: Secondary | ICD-10-CM | POA: Diagnosis not present

## 2017-02-13 ENCOUNTER — Ambulatory Visit (INDEPENDENT_AMBULATORY_CARE_PROVIDER_SITE_OTHER): Payer: Medicare Other | Admitting: Pharmacist

## 2017-02-13 DIAGNOSIS — I10 Essential (primary) hypertension: Secondary | ICD-10-CM

## 2017-02-13 MED ORDER — SPIRONOLACTONE 25 MG PO TABS: ORAL_TABLET | ORAL | 3 refills | Status: DC

## 2017-02-13 NOTE — Patient Instructions (Signed)
Thanks for coming to see [place]! It was a pleasure working with you.   Continue taking your metoprolol succinate 12.5 mg daily, spironolactone 25 mg in the morning and 12.5 mg in the evenings, losartan 50 mg twice a daily.   Keep taking your blood pressures at home and write them down.   Please follow up with your cardiologist as usual. Call us if you have any concerns.

## 2017-02-13 NOTE — Progress Notes (Signed)
Wendy [name] ID: Wendy [name]                 DOB: 02/02/1946                      MRN: [id number]     HPI: Wendy [name] is a 71 y.o. female referred by Dr. Penny Pia HTN clinic.PMH is significant for HLD, HTN, IBS, malignant melanoma, diverticulosis of colon, GERD, carotid artery disease. Pt went to the ER in 12/2014 for elevated BP and was started on valsartan-amlodipine 160-5 mg 1/2 tab daily in addition to her existing BP medications. She was seen by Dr. Acie Fredrickson in 03/2016, and BP was 142/88. She was tolerating valsartan-amlodipine well at that time and current regimen was continued. Pt called in on 10/04/2016 to report that she has been affected by the valsartan recall and that shewas feeling tired and experiencing ankle swelling. Valsartan-amlodipine was discontinued as a result and substituted with irbesartan 150 mg daily.At following Rx clinic visit, BP remained elevated and irbesartan was increased to 300 mg daily. Pt then reported "not feeling well" so irbesartan was transitioned to olmesartan. Olmesartan was found to be cost prohibitive and pt was fearful of ADE so losartan was initiated. Pt then experienced body aches and ear fullness so losartan dose was reduced from 100 mg to 50 mg, Dyazide was D/C'd, and spironolactone was started. At f/u HTN clinic visit 12/12/16 pt reported spironolactone 25 mg daily caused "roaring" in her ears so she had mostly been taking 12.5 mg daily. Advised pt to continue to take 25 mg daily at that time and BMET repeat remained stable. At last HTN clinic visit on 11/15 Wendy [name] reported controlled home BP readings and was found to be taking spironolactone 12.5 BID and metoprolol 12.5 mg daily. BP was still found to be elevated in office so spironolactone was increased to 25 mg BID. F/u BMET with slight increase in Scr and K but acceptable.   Today Wendy [name] reports she is been doing well. Denies any issues with medications, however she is taking spironolactone 25 mg in the  morning and 12.5 mg in the evenings as 25 mg BID caused fatigue. Fatigue improved on this 1/2 dose in the evening. Compliant with all other medications per Wendy [name]. Took medications ~ 1 hour ago. Forgot to bring in BP readings from home but reports they have been in the 120-30s/70s-80s mmHg.   Has been trying walk more but still not exercising much with the weather being so poor. Has not been eating healthy with the holidays. Wants to lose ~10 lbs.   Current HTN meds: -Toprol XL 12.5 mg daily -Spironolactone25 mg every morning and 12.5 mg in the evening -Losartan 50 mg BID  Previously tried:Valsartan-amlodipine 160-5 mg 1/2 tab daily (ankle swelling),Dyazide 37.5-25mg  0.5 tab PRNfor fluid in ears (d/c'd when spironolactone started), irbesartan ("not feeling well"), olmesartan (expensive, fearful of ADE), hydrochlorthiazide (hyponatremia)  BP goal: <130/80 mmHg  Family History:  Mother - diabetes, heart disease, HLD, HTN Father - cancer, DVT, MI Brother - cancer  Social History: Never smoked. Denies alcohol or illicit drug use.   Diet:Drinks half a cup of coffee in the mornings occasionally. Doesadd a small amount of salt when cooking food. Cooks more at home.Eats canned vegetables "all the time" but does buy low sodium.Does not really eat too much processed meats. Likes to eat vegetables and fruits. No soda. Drinks mostly water.   Exercise:Has stopped walking again with the holidays.  Home BP readings: per Wendy [name] memory, 120-30s/70s-80s.  Wt Readings from Last 3 Encounters:  12/12/16 138 lb 6.4 oz (62.8 kg)  11/14/16 138 lb (62.6 kg)  03/30/16 136 lb 6.4 oz (61.9 kg)   BP Readings from Last 3 Encounters:  01/11/17 (!) 142/80  12/12/16 (!) 146/88  11/14/16 (!) 162/88   Pulse Readings from Last 3 Encounters:  01/11/17 (!) 59  12/12/16 64  11/14/16 (!) 59    Renal function: CrCl cannot be calculated (Unknown ideal weight.).  Past Medical History:    Diagnosis Date  . Anxiety   . Atypical chest pain   . B12 deficiency   . Cervical spondylosis without myelopathy   . Diverticulosis of colon   . Dysesthesia   . GERD (gastroesophageal reflux disease)   . Hypercholesteremia   . Hypertension   . IBS (irritable bowel syndrome)   . Malignant melanoma ([place])   . Vitreous hemorrhage ([place])     Current Outpatient Medications on File Prior to Visit  Medication Sig Dispense Refill  . ALPRAZolam (XANAX) 0.5 MG tablet Take 0.5 mg by mouth at bedtime. 1/3 TABLET BY MOUTH AT BEDTIME FOR SLEEP    . aspirin 81 MG tablet Take 81 mg by mouth daily.    Marland Kitchen b complex vitamins tablet Take 1 tablet by mouth daily.    . Cholecalciferol (VITAMIN D) 2000 UNITS CAPS Take 1 capsule by mouth daily.    . Coenzyme Q10 (COQ10) 200 MG CAPS Take 1 capsule by mouth daily.    . Cranberry 400 MG CAPS Take 400 mg by mouth daily.    Marland Kitchen DIGEST ENZYMES-ANTICHOLINERGIC PO Take 1 capsule by mouth 3 (three) times daily.     Marland Kitchen ibuprofen (ADVIL,MOTRIN) 200 MG tablet Take 200 mg by mouth every 6 (six) hours as needed for moderate pain.     Marland Kitchen losartan (COZAAR) 100 MG tablet Take 0.5 tablets (50 mg total) by mouth daily. (Wendy [name] taking differently: Take 50 mg by mouth 2 (two) times daily. ) 90 tablet 3  . metoprolol succinate (TOPROL-XL) 25 MG 24 hr tablet TAKE 1 TABLET BY MOUTH DAILY (Wendy [name] taking differently: Taking 1/2 tablet daily) 30 tablet 0  . Misc Natural Products (TART CHERRY ADVANCED) CAPS Take 2 capsules by mouth daily.    . Multiple Vitamins-Minerals (MULTIVITAMIN & MINERAL PO) Take 1 tablet by mouth daily.    . Omega-3 Fatty Acids (THE VERY FINEST FISH OIL) LIQD Take 1 1/2 tbsp daily    . Red Yeast Rice 600 MG CAPS Take 2 capsules by mouth daily.    Marland Kitchen spironolactone (ALDACTONE) 25 MG tablet Take 1 tablet (25 mg total) 2 (two) times daily by mouth. 60 tablet 3   No current facility-administered medications on file prior to visit.     Allergies  Allergen Reactions   . Hctz [Hydrochlorothiazide] Other (See Comments)    hyponatremia  . Codeine Nausea Only and Nausea And Vomiting  . Hydrocodone-Acetaminophen Nausea And Vomiting  . Lisinopril Other (See Comments)    REACTION: INTOL to Prinivil w/ pain on urination   . Neomycin Dermatitis  . Other Nausea Only    fragrance  . Oxycodone Nausea And Vomiting  . Pravastatin Sodium Other (See Comments)    REACTION: INTOL to Pravachol w/ myalgias     Assessment/Plan:  1. Hypertension - BP 124/70 mmHg on recheck today which is below of of < 130/80 mmHg. Wendy [name] non compliant with prescribed regimen 2/2 fatigue. Fatigue improved on current regimen. Follow  up BMET stable after last change in spironolactone. Will continue current regimen of metoprolol succinate 12.5 mg daily, spironolactone 25 mg in the morning and 12.5 mg in the evenings, losartan 50 mg twice a daily. Reinforced need to incorporate exercise for cardiovascular health and weight loss. Counseled on dietary changes and general pathophysiology of sodium's effects on blood pressure. Wendy [name] to follow up with cardiologist as usual.  Asked her to write down BP from home and bring into follow up visits.   Carlean Jews, Pharm.D., BCPS PGY2 [facility] Pharm[facility] Resident Phone: [phone]

## 2017-02-22 DIAGNOSIS — R300 Dysuria: Secondary | ICD-10-CM | POA: Diagnosis not present

## 2017-02-22 DIAGNOSIS — R390 Extravasation of urine: Secondary | ICD-10-CM | POA: Diagnosis not present

## 2017-03-12 DIAGNOSIS — Z6826 Body mass index (BMI) 26.0-26.9, adult: Secondary | ICD-10-CM | POA: Diagnosis not present

## 2017-03-12 DIAGNOSIS — C4330 Malignant melanoma of unspecified part of face: Secondary | ICD-10-CM | POA: Diagnosis not present

## 2017-03-12 DIAGNOSIS — Z1389 Encounter for screening for other disorder: Secondary | ICD-10-CM | POA: Diagnosis not present

## 2017-03-12 DIAGNOSIS — I10 Essential (primary) hypertension: Secondary | ICD-10-CM | POA: Diagnosis not present

## 2017-03-12 DIAGNOSIS — E871 Hypo-osmolality and hyponatremia: Secondary | ICD-10-CM | POA: Diagnosis not present

## 2017-03-12 DIAGNOSIS — E538 Deficiency of other specified B group vitamins: Secondary | ICD-10-CM | POA: Diagnosis not present

## 2017-03-12 DIAGNOSIS — K219 Gastro-esophageal reflux disease without esophagitis: Secondary | ICD-10-CM | POA: Diagnosis not present

## 2017-03-12 DIAGNOSIS — F418 Other specified anxiety disorders: Secondary | ICD-10-CM | POA: Diagnosis not present

## 2017-03-12 DIAGNOSIS — I7789 Other specified disorders of arteries and arterioles: Secondary | ICD-10-CM | POA: Diagnosis not present

## 2017-03-12 DIAGNOSIS — D126 Benign neoplasm of colon, unspecified: Secondary | ICD-10-CM | POA: Diagnosis not present

## 2017-03-12 DIAGNOSIS — E7849 Other hyperlipidemia: Secondary | ICD-10-CM | POA: Diagnosis not present

## 2017-03-23 DIAGNOSIS — L7211 Pilar cyst: Secondary | ICD-10-CM | POA: Diagnosis not present

## 2017-03-23 DIAGNOSIS — D1801 Hemangioma of skin and subcutaneous tissue: Secondary | ICD-10-CM | POA: Diagnosis not present

## 2017-03-23 DIAGNOSIS — D225 Melanocytic nevi of trunk: Secondary | ICD-10-CM | POA: Diagnosis not present

## 2017-03-23 DIAGNOSIS — D2262 Melanocytic nevi of left upper limb, including shoulder: Secondary | ICD-10-CM | POA: Diagnosis not present

## 2017-03-23 DIAGNOSIS — Z85828 Personal history of other malignant neoplasm of skin: Secondary | ICD-10-CM | POA: Diagnosis not present

## 2017-03-23 DIAGNOSIS — D2261 Melanocytic nevi of right upper limb, including shoulder: Secondary | ICD-10-CM | POA: Diagnosis not present

## 2017-03-23 DIAGNOSIS — Z85820 Personal history of malignant melanoma of skin: Secondary | ICD-10-CM | POA: Diagnosis not present

## 2017-03-23 DIAGNOSIS — L570 Actinic keratosis: Secondary | ICD-10-CM | POA: Diagnosis not present

## 2017-03-23 DIAGNOSIS — L814 Other melanin hyperpigmentation: Secondary | ICD-10-CM | POA: Diagnosis not present

## 2017-03-23 DIAGNOSIS — L821 Other seborrheic keratosis: Secondary | ICD-10-CM | POA: Diagnosis not present

## 2017-05-08 DIAGNOSIS — H2513 Age-related nuclear cataract, bilateral: Secondary | ICD-10-CM | POA: Diagnosis not present

## 2017-05-12 DIAGNOSIS — K5750 Diverticulosis of both small and large intestine without perforation or abscess without bleeding: Secondary | ICD-10-CM | POA: Diagnosis not present

## 2017-05-12 DIAGNOSIS — N3289 Other specified disorders of bladder: Secondary | ICD-10-CM | POA: Diagnosis not present

## 2017-05-12 DIAGNOSIS — I10 Essential (primary) hypertension: Secondary | ICD-10-CM | POA: Diagnosis not present

## 2017-05-12 DIAGNOSIS — R1030 Lower abdominal pain, unspecified: Secondary | ICD-10-CM | POA: Diagnosis not present

## 2017-05-12 DIAGNOSIS — K5732 Diverticulitis of large intestine without perforation or abscess without bleeding: Secondary | ICD-10-CM | POA: Diagnosis not present

## 2017-05-12 DIAGNOSIS — R188 Other ascites: Secondary | ICD-10-CM | POA: Diagnosis not present

## 2017-05-16 DIAGNOSIS — H6123 Impacted cerumen, bilateral: Secondary | ICD-10-CM | POA: Diagnosis not present

## 2017-05-16 DIAGNOSIS — H833X1 Noise effects on right inner ear: Secondary | ICD-10-CM | POA: Diagnosis not present

## 2017-05-16 DIAGNOSIS — H9311 Tinnitus, right ear: Secondary | ICD-10-CM | POA: Diagnosis not present

## 2017-05-16 DIAGNOSIS — H9041 Sensorineural hearing loss, unilateral, right ear, with unrestricted hearing on the contralateral side: Secondary | ICD-10-CM | POA: Diagnosis not present

## 2017-05-16 DIAGNOSIS — M26609 Unspecified temporomandibular joint disorder, unspecified side: Secondary | ICD-10-CM | POA: Diagnosis not present

## 2017-05-22 DIAGNOSIS — E538 Deficiency of other specified B group vitamins: Secondary | ICD-10-CM | POA: Diagnosis not present

## 2017-05-22 DIAGNOSIS — K219 Gastro-esophageal reflux disease without esophagitis: Secondary | ICD-10-CM | POA: Diagnosis not present

## 2017-05-22 DIAGNOSIS — E7849 Other hyperlipidemia: Secondary | ICD-10-CM | POA: Diagnosis not present

## 2017-05-22 DIAGNOSIS — D126 Benign neoplasm of colon, unspecified: Secondary | ICD-10-CM | POA: Diagnosis not present

## 2017-05-22 DIAGNOSIS — F419 Anxiety disorder, unspecified: Secondary | ICD-10-CM | POA: Diagnosis not present

## 2017-05-22 DIAGNOSIS — I779 Disorder of arteries and arterioles, unspecified: Secondary | ICD-10-CM | POA: Diagnosis not present

## 2017-05-22 DIAGNOSIS — I10 Essential (primary) hypertension: Secondary | ICD-10-CM | POA: Diagnosis not present

## 2017-05-22 DIAGNOSIS — Z1389 Encounter for screening for other disorder: Secondary | ICD-10-CM | POA: Diagnosis not present

## 2017-05-22 DIAGNOSIS — E871 Hypo-osmolality and hyponatremia: Secondary | ICD-10-CM | POA: Diagnosis not present

## 2017-06-25 DIAGNOSIS — M6740 Ganglion, unspecified site: Secondary | ICD-10-CM | POA: Diagnosis not present

## 2017-06-25 DIAGNOSIS — L905 Scar conditions and fibrosis of skin: Secondary | ICD-10-CM | POA: Diagnosis not present

## 2017-06-25 DIAGNOSIS — L814 Other melanin hyperpigmentation: Secondary | ICD-10-CM | POA: Diagnosis not present

## 2017-06-25 DIAGNOSIS — L821 Other seborrheic keratosis: Secondary | ICD-10-CM | POA: Diagnosis not present

## 2017-06-25 DIAGNOSIS — Z85820 Personal history of malignant melanoma of skin: Secondary | ICD-10-CM | POA: Diagnosis not present

## 2017-06-25 DIAGNOSIS — L7211 Pilar cyst: Secondary | ICD-10-CM | POA: Diagnosis not present

## 2017-06-25 DIAGNOSIS — D485 Neoplasm of uncertain behavior of skin: Secondary | ICD-10-CM | POA: Diagnosis not present

## 2017-06-25 DIAGNOSIS — Z85828 Personal history of other malignant neoplasm of skin: Secondary | ICD-10-CM | POA: Diagnosis not present

## 2017-06-25 DIAGNOSIS — L570 Actinic keratosis: Secondary | ICD-10-CM | POA: Diagnosis not present

## 2017-06-29 DIAGNOSIS — H43393 Other vitreous opacities, bilateral: Secondary | ICD-10-CM | POA: Diagnosis not present

## 2017-06-29 DIAGNOSIS — H2513 Age-related nuclear cataract, bilateral: Secondary | ICD-10-CM | POA: Diagnosis not present

## 2017-06-29 DIAGNOSIS — H35423 Microcystoid degeneration of retina, bilateral: Secondary | ICD-10-CM | POA: Diagnosis not present

## 2017-06-29 DIAGNOSIS — H33312 Horseshoe tear of retina without detachment, left eye: Secondary | ICD-10-CM | POA: Diagnosis not present

## 2017-06-29 DIAGNOSIS — H43813 Vitreous degeneration, bilateral: Secondary | ICD-10-CM | POA: Diagnosis not present

## 2017-07-03 ENCOUNTER — Other Ambulatory Visit: Payer: Self-pay | Admitting: Obstetrics & Gynecology

## 2017-07-03 DIAGNOSIS — Z1231 Encounter for screening mammogram for malignant neoplasm of breast: Secondary | ICD-10-CM

## 2017-07-09 ENCOUNTER — Other Ambulatory Visit: Payer: Self-pay | Admitting: Obstetrics & Gynecology

## 2017-07-09 DIAGNOSIS — N644 Mastodynia: Secondary | ICD-10-CM

## 2017-07-24 DIAGNOSIS — C4330 Malignant melanoma of unspecified part of face: Secondary | ICD-10-CM | POA: Diagnosis not present

## 2017-07-24 DIAGNOSIS — Z6826 Body mass index (BMI) 26.0-26.9, adult: Secondary | ICD-10-CM | POA: Diagnosis not present

## 2017-07-24 DIAGNOSIS — E7849 Other hyperlipidemia: Secondary | ICD-10-CM | POA: Diagnosis not present

## 2017-07-24 DIAGNOSIS — I779 Disorder of arteries and arterioles, unspecified: Secondary | ICD-10-CM | POA: Diagnosis not present

## 2017-07-24 DIAGNOSIS — Z1389 Encounter for screening for other disorder: Secondary | ICD-10-CM | POA: Diagnosis not present

## 2017-07-24 DIAGNOSIS — E538 Deficiency of other specified B group vitamins: Secondary | ICD-10-CM | POA: Diagnosis not present

## 2017-07-24 DIAGNOSIS — I10 Essential (primary) hypertension: Secondary | ICD-10-CM | POA: Diagnosis not present

## 2017-07-24 DIAGNOSIS — E871 Hypo-osmolality and hyponatremia: Secondary | ICD-10-CM | POA: Diagnosis not present

## 2017-07-24 DIAGNOSIS — D126 Benign neoplasm of colon, unspecified: Secondary | ICD-10-CM | POA: Diagnosis not present

## 2017-07-24 DIAGNOSIS — K589 Irritable bowel syndrome without diarrhea: Secondary | ICD-10-CM | POA: Diagnosis not present

## 2017-07-24 DIAGNOSIS — F418 Other specified anxiety disorders: Secondary | ICD-10-CM | POA: Diagnosis not present

## 2017-07-30 ENCOUNTER — Other Ambulatory Visit: Payer: Self-pay | Admitting: Obstetrics & Gynecology

## 2017-07-30 ENCOUNTER — Ambulatory Visit
Admission: RE | Admit: 2017-07-30 | Discharge: 2017-07-30 | Disposition: A | Discharge status: Home / Self Care | Payer: Medicare Other | Source: Ambulatory Visit | Attending: Obstetrics & Gynecology | Admitting: Obstetrics & Gynecology

## 2017-07-30 DIAGNOSIS — N644 Mastodynia: Secondary | ICD-10-CM

## 2017-07-30 DIAGNOSIS — N6323 Unspecified lump in the left breast, lower outer quadrant: Secondary | ICD-10-CM | POA: Diagnosis not present

## 2017-07-30 DIAGNOSIS — R928 Other abnormal and inconclusive findings on diagnostic imaging of breast: Secondary | ICD-10-CM | POA: Diagnosis not present

## 2017-07-30 DIAGNOSIS — N6320 Unspecified lump in the left breast, unspecified quadrant: Secondary | ICD-10-CM

## 2017-07-30 DIAGNOSIS — N6321 Unspecified lump in the left breast, upper outer quadrant: Secondary | ICD-10-CM | POA: Diagnosis not present

## 2017-08-02 ENCOUNTER — Other Ambulatory Visit: Payer: Self-pay | Admitting: Cardiovascular Disease

## 2017-08-02 DIAGNOSIS — I10 Essential (primary) hypertension: Secondary | ICD-10-CM

## 2017-08-06 ENCOUNTER — Other Ambulatory Visit: Payer: Self-pay | Admitting: Obstetrics & Gynecology

## 2017-08-06 DIAGNOSIS — N6320 Unspecified lump in the left breast, unspecified quadrant: Secondary | ICD-10-CM

## 2017-08-07 ENCOUNTER — Ambulatory Visit
Admission: RE | Admit: 2017-08-07 | Discharge: 2017-08-07 | Disposition: A | Discharge status: Home / Self Care | Payer: Medicare Other | Source: Ambulatory Visit | Attending: Obstetrics & Gynecology | Admitting: Obstetrics & Gynecology

## 2017-08-07 DIAGNOSIS — N6320 Unspecified lump in the left breast, unspecified quadrant: Secondary | ICD-10-CM

## 2017-08-14 DIAGNOSIS — H02055 Trichiasis without entropian left lower eyelid: Secondary | ICD-10-CM | POA: Diagnosis not present

## 2017-08-14 DIAGNOSIS — H2513 Age-related nuclear cataract, bilateral: Secondary | ICD-10-CM | POA: Diagnosis not present

## 2017-08-20 DIAGNOSIS — H02889 Meibomian gland dysfunction of unspecified eye, unspecified eyelid: Secondary | ICD-10-CM | POA: Diagnosis not present

## 2017-08-20 DIAGNOSIS — H04123 Dry eye syndrome of bilateral lacrimal glands: Secondary | ICD-10-CM | POA: Diagnosis not present

## 2017-08-24 DIAGNOSIS — H43813 Vitreous degeneration, bilateral: Secondary | ICD-10-CM | POA: Diagnosis not present

## 2017-08-24 DIAGNOSIS — H35423 Microcystoid degeneration of retina, bilateral: Secondary | ICD-10-CM | POA: Diagnosis not present

## 2017-08-24 DIAGNOSIS — H43393 Other vitreous opacities, bilateral: Secondary | ICD-10-CM | POA: Diagnosis not present

## 2017-08-24 DIAGNOSIS — H2513 Age-related nuclear cataract, bilateral: Secondary | ICD-10-CM | POA: Diagnosis not present

## 2017-09-17 DIAGNOSIS — D225 Melanocytic nevi of trunk: Secondary | ICD-10-CM | POA: Diagnosis not present

## 2017-09-17 DIAGNOSIS — L570 Actinic keratosis: Secondary | ICD-10-CM | POA: Diagnosis not present

## 2017-09-17 DIAGNOSIS — D485 Neoplasm of uncertain behavior of skin: Secondary | ICD-10-CM | POA: Diagnosis not present

## 2017-09-17 DIAGNOSIS — Z85820 Personal history of malignant melanoma of skin: Secondary | ICD-10-CM | POA: Diagnosis not present

## 2017-09-17 DIAGNOSIS — L821 Other seborrheic keratosis: Secondary | ICD-10-CM | POA: Diagnosis not present

## 2017-09-17 DIAGNOSIS — L814 Other melanin hyperpigmentation: Secondary | ICD-10-CM | POA: Diagnosis not present

## 2017-09-17 DIAGNOSIS — C44311 Basal cell carcinoma of skin of nose: Secondary | ICD-10-CM | POA: Diagnosis not present

## 2017-09-17 DIAGNOSIS — Z85828 Personal history of other malignant neoplasm of skin: Secondary | ICD-10-CM | POA: Diagnosis not present

## 2017-09-19 ENCOUNTER — Other Ambulatory Visit: Payer: Self-pay | Admitting: Obstetrics & Gynecology

## 2017-09-19 DIAGNOSIS — N6320 Unspecified lump in the left breast, unspecified quadrant: Secondary | ICD-10-CM

## 2017-09-28 DIAGNOSIS — E7849 Other hyperlipidemia: Secondary | ICD-10-CM | POA: Diagnosis not present

## 2017-09-28 DIAGNOSIS — C4330 Malignant melanoma of unspecified part of face: Secondary | ICD-10-CM | POA: Diagnosis not present

## 2017-09-28 DIAGNOSIS — E538 Deficiency of other specified B group vitamins: Secondary | ICD-10-CM | POA: Diagnosis not present

## 2017-09-28 DIAGNOSIS — Z6826 Body mass index (BMI) 26.0-26.9, adult: Secondary | ICD-10-CM | POA: Diagnosis not present

## 2017-09-28 DIAGNOSIS — D126 Benign neoplasm of colon, unspecified: Secondary | ICD-10-CM | POA: Diagnosis not present

## 2017-09-28 DIAGNOSIS — F418 Other specified anxiety disorders: Secondary | ICD-10-CM | POA: Diagnosis not present

## 2017-09-28 DIAGNOSIS — E871 Hypo-osmolality and hyponatremia: Secondary | ICD-10-CM | POA: Diagnosis not present

## 2017-09-28 DIAGNOSIS — I10 Essential (primary) hypertension: Secondary | ICD-10-CM | POA: Diagnosis not present

## 2017-09-28 DIAGNOSIS — I779 Disorder of arteries and arterioles, unspecified: Secondary | ICD-10-CM | POA: Diagnosis not present

## 2017-10-01 ENCOUNTER — Other Ambulatory Visit: Payer: Self-pay | Admitting: Cardiovascular Disease

## 2017-10-01 DIAGNOSIS — Z01419 Encounter for gynecological examination (general) (routine) without abnormal findings: Secondary | ICD-10-CM | POA: Diagnosis not present

## 2017-10-01 DIAGNOSIS — I10 Essential (primary) hypertension: Secondary | ICD-10-CM

## 2017-10-16 DIAGNOSIS — Z85820 Personal history of malignant melanoma of skin: Secondary | ICD-10-CM | POA: Diagnosis not present

## 2017-10-16 DIAGNOSIS — C44311 Basal cell carcinoma of skin of nose: Secondary | ICD-10-CM | POA: Diagnosis not present

## 2017-10-16 DIAGNOSIS — Z85828 Personal history of other malignant neoplasm of skin: Secondary | ICD-10-CM | POA: Diagnosis not present

## 2017-10-31 ENCOUNTER — Ambulatory Visit
Admission: RE | Admit: 2017-10-31 | Discharge: 2017-10-31 | Disposition: A | Discharge status: Home / Self Care | Payer: Medicare Other | Source: Ambulatory Visit | Attending: Obstetrics & Gynecology | Admitting: Obstetrics & Gynecology

## 2017-10-31 DIAGNOSIS — N6320 Unspecified lump in the left breast, unspecified quadrant: Secondary | ICD-10-CM

## 2017-10-31 DIAGNOSIS — N641 Fat necrosis of breast: Secondary | ICD-10-CM | POA: Diagnosis not present

## 2017-11-23 DIAGNOSIS — Z23 Encounter for immunization: Secondary | ICD-10-CM | POA: Diagnosis not present

## 2017-12-06 DIAGNOSIS — I10 Essential (primary) hypertension: Secondary | ICD-10-CM | POA: Diagnosis not present

## 2017-12-06 DIAGNOSIS — R82998 Other abnormal findings in urine: Secondary | ICD-10-CM | POA: Diagnosis not present

## 2017-12-06 DIAGNOSIS — E7849 Other hyperlipidemia: Secondary | ICD-10-CM | POA: Diagnosis not present

## 2017-12-06 DIAGNOSIS — E538 Deficiency of other specified B group vitamins: Secondary | ICD-10-CM | POA: Diagnosis not present

## 2017-12-10 DIAGNOSIS — D485 Neoplasm of uncertain behavior of skin: Secondary | ICD-10-CM | POA: Diagnosis not present

## 2017-12-10 DIAGNOSIS — L7211 Pilar cyst: Secondary | ICD-10-CM | POA: Diagnosis not present

## 2017-12-10 DIAGNOSIS — L738 Other specified follicular disorders: Secondary | ICD-10-CM | POA: Diagnosis not present

## 2017-12-10 DIAGNOSIS — Z85820 Personal history of malignant melanoma of skin: Secondary | ICD-10-CM | POA: Diagnosis not present

## 2017-12-10 DIAGNOSIS — L821 Other seborrheic keratosis: Secondary | ICD-10-CM | POA: Diagnosis not present

## 2017-12-10 DIAGNOSIS — I788 Other diseases of capillaries: Secondary | ICD-10-CM | POA: Diagnosis not present

## 2017-12-10 DIAGNOSIS — L570 Actinic keratosis: Secondary | ICD-10-CM | POA: Diagnosis not present

## 2017-12-10 DIAGNOSIS — Z85828 Personal history of other malignant neoplasm of skin: Secondary | ICD-10-CM | POA: Diagnosis not present

## 2017-12-10 DIAGNOSIS — D225 Melanocytic nevi of trunk: Secondary | ICD-10-CM | POA: Diagnosis not present

## 2017-12-11 DIAGNOSIS — K649 Unspecified hemorrhoids: Secondary | ICD-10-CM | POA: Diagnosis not present

## 2017-12-11 DIAGNOSIS — Z1389 Encounter for screening for other disorder: Secondary | ICD-10-CM | POA: Diagnosis not present

## 2017-12-11 DIAGNOSIS — Z6826 Body mass index (BMI) 26.0-26.9, adult: Secondary | ICD-10-CM | POA: Diagnosis not present

## 2017-12-11 DIAGNOSIS — E7849 Other hyperlipidemia: Secondary | ICD-10-CM | POA: Diagnosis not present

## 2017-12-11 DIAGNOSIS — F419 Anxiety disorder, unspecified: Secondary | ICD-10-CM | POA: Diagnosis not present

## 2017-12-11 DIAGNOSIS — D126 Benign neoplasm of colon, unspecified: Secondary | ICD-10-CM | POA: Diagnosis not present

## 2017-12-11 DIAGNOSIS — C4330 Malignant melanoma of unspecified part of face: Secondary | ICD-10-CM | POA: Diagnosis not present

## 2017-12-11 DIAGNOSIS — I10 Essential (primary) hypertension: Secondary | ICD-10-CM | POA: Diagnosis not present

## 2017-12-11 DIAGNOSIS — I779 Disorder of arteries and arterioles, unspecified: Secondary | ICD-10-CM | POA: Diagnosis not present

## 2017-12-11 DIAGNOSIS — E538 Deficiency of other specified B group vitamins: Secondary | ICD-10-CM | POA: Diagnosis not present

## 2017-12-11 DIAGNOSIS — K589 Irritable bowel syndrome without diarrhea: Secondary | ICD-10-CM | POA: Diagnosis not present

## 2017-12-11 DIAGNOSIS — Z0000 Encounter for general adult medical examination without abnormal findings: Secondary | ICD-10-CM | POA: Diagnosis not present

## 2017-12-24 DIAGNOSIS — M170 Bilateral primary osteoarthritis of knee: Secondary | ICD-10-CM | POA: Diagnosis not present

## 2017-12-25 DIAGNOSIS — H6123 Impacted cerumen, bilateral: Secondary | ICD-10-CM | POA: Diagnosis not present

## 2017-12-25 DIAGNOSIS — H833X1 Noise effects on right inner ear: Secondary | ICD-10-CM | POA: Diagnosis not present

## 2017-12-26 DIAGNOSIS — Z8742 Personal history of other diseases of the female genital tract: Secondary | ICD-10-CM | POA: Diagnosis not present

## 2018-03-11 DIAGNOSIS — Z8589 Personal history of malignant neoplasm of other organs and systems: Secondary | ICD-10-CM | POA: Diagnosis not present

## 2018-03-11 DIAGNOSIS — M952 Other acquired deformity of head: Secondary | ICD-10-CM | POA: Diagnosis not present

## 2018-03-11 DIAGNOSIS — L905 Scar conditions and fibrosis of skin: Secondary | ICD-10-CM | POA: Diagnosis not present

## 2018-03-12 DIAGNOSIS — D2261 Melanocytic nevi of right upper limb, including shoulder: Secondary | ICD-10-CM | POA: Diagnosis not present

## 2018-03-12 DIAGNOSIS — L570 Actinic keratosis: Secondary | ICD-10-CM | POA: Diagnosis not present

## 2018-03-12 DIAGNOSIS — Z85828 Personal history of other malignant neoplasm of skin: Secondary | ICD-10-CM | POA: Diagnosis not present

## 2018-03-12 DIAGNOSIS — D2262 Melanocytic nevi of left upper limb, including shoulder: Secondary | ICD-10-CM | POA: Diagnosis not present

## 2018-03-12 DIAGNOSIS — Z85820 Personal history of malignant melanoma of skin: Secondary | ICD-10-CM | POA: Diagnosis not present

## 2018-03-12 DIAGNOSIS — I788 Other diseases of capillaries: Secondary | ICD-10-CM | POA: Diagnosis not present

## 2018-03-12 DIAGNOSIS — D225 Melanocytic nevi of trunk: Secondary | ICD-10-CM | POA: Diagnosis not present

## 2018-03-12 DIAGNOSIS — L821 Other seborrheic keratosis: Secondary | ICD-10-CM | POA: Diagnosis not present

## 2018-03-12 DIAGNOSIS — D485 Neoplasm of uncertain behavior of skin: Secondary | ICD-10-CM | POA: Diagnosis not present

## 2018-04-29 DIAGNOSIS — Z85828 Personal history of other malignant neoplasm of skin: Secondary | ICD-10-CM | POA: Diagnosis not present

## 2018-04-29 DIAGNOSIS — I781 Nevus, non-neoplastic: Secondary | ICD-10-CM | POA: Diagnosis not present

## 2018-06-04 DIAGNOSIS — D1801 Hemangioma of skin and subcutaneous tissue: Secondary | ICD-10-CM | POA: Diagnosis not present

## 2018-06-04 DIAGNOSIS — D225 Melanocytic nevi of trunk: Secondary | ICD-10-CM | POA: Diagnosis not present

## 2018-06-04 DIAGNOSIS — D692 Other nonthrombocytopenic purpura: Secondary | ICD-10-CM | POA: Diagnosis not present

## 2018-06-04 DIAGNOSIS — Z85828 Personal history of other malignant neoplasm of skin: Secondary | ICD-10-CM | POA: Diagnosis not present

## 2018-06-04 DIAGNOSIS — D485 Neoplasm of uncertain behavior of skin: Secondary | ICD-10-CM | POA: Diagnosis not present

## 2018-06-04 DIAGNOSIS — L814 Other melanin hyperpigmentation: Secondary | ICD-10-CM | POA: Diagnosis not present

## 2018-06-04 DIAGNOSIS — I788 Other diseases of capillaries: Secondary | ICD-10-CM | POA: Diagnosis not present

## 2018-06-04 DIAGNOSIS — Z85820 Personal history of malignant melanoma of skin: Secondary | ICD-10-CM | POA: Diagnosis not present

## 2018-06-04 DIAGNOSIS — L821 Other seborrheic keratosis: Secondary | ICD-10-CM | POA: Diagnosis not present

## 2018-06-04 DIAGNOSIS — L820 Inflamed seborrheic keratosis: Secondary | ICD-10-CM | POA: Diagnosis not present

## 2018-06-04 DIAGNOSIS — L570 Actinic keratosis: Secondary | ICD-10-CM | POA: Diagnosis not present

## 2018-06-11 DIAGNOSIS — E538 Deficiency of other specified B group vitamins: Secondary | ICD-10-CM | POA: Diagnosis not present

## 2018-06-11 DIAGNOSIS — I779 Disorder of arteries and arterioles, unspecified: Secondary | ICD-10-CM | POA: Diagnosis not present

## 2018-06-11 DIAGNOSIS — C4330 Malignant melanoma of unspecified part of face: Secondary | ICD-10-CM | POA: Diagnosis not present

## 2018-06-11 DIAGNOSIS — E871 Hypo-osmolality and hyponatremia: Secondary | ICD-10-CM | POA: Diagnosis not present

## 2018-06-11 DIAGNOSIS — K219 Gastro-esophageal reflux disease without esophagitis: Secondary | ICD-10-CM | POA: Diagnosis not present

## 2018-06-11 DIAGNOSIS — I10 Essential (primary) hypertension: Secondary | ICD-10-CM | POA: Diagnosis not present

## 2018-06-11 DIAGNOSIS — K5792 Diverticulitis of intestine, part unspecified, without perforation or abscess without bleeding: Secondary | ICD-10-CM | POA: Diagnosis not present

## 2018-06-11 DIAGNOSIS — D126 Benign neoplasm of colon, unspecified: Secondary | ICD-10-CM | POA: Diagnosis not present

## 2018-06-11 DIAGNOSIS — K589 Irritable bowel syndrome without diarrhea: Secondary | ICD-10-CM | POA: Diagnosis not present

## 2018-06-11 DIAGNOSIS — E785 Hyperlipidemia, unspecified: Secondary | ICD-10-CM | POA: Diagnosis not present

## 2018-06-11 DIAGNOSIS — F419 Anxiety disorder, unspecified: Secondary | ICD-10-CM | POA: Diagnosis not present

## 2018-07-18 ENCOUNTER — Other Ambulatory Visit: Payer: Self-pay | Admitting: Gastroenterology

## 2018-07-18 ENCOUNTER — Other Ambulatory Visit (HOSPITAL_COMMUNITY): Payer: Self-pay | Admitting: Gastroenterology

## 2018-07-18 DIAGNOSIS — K219 Gastro-esophageal reflux disease without esophagitis: Secondary | ICD-10-CM | POA: Diagnosis not present

## 2018-07-18 DIAGNOSIS — R1011 Right upper quadrant pain: Secondary | ICD-10-CM

## 2018-07-18 DIAGNOSIS — R142 Eructation: Secondary | ICD-10-CM | POA: Diagnosis not present

## 2018-07-18 DIAGNOSIS — Z1211 Encounter for screening for malignant neoplasm of colon: Secondary | ICD-10-CM | POA: Diagnosis not present

## 2018-08-07 ENCOUNTER — Ambulatory Visit (HOSPITAL_COMMUNITY)
Admission: RE | Admit: 2018-08-07 | Discharge: 2018-08-07 | Disposition: A | Discharge status: Home / Self Care | Payer: Medicare Other | Source: Ambulatory Visit | Attending: Gastroenterology | Admitting: Gastroenterology

## 2018-08-07 ENCOUNTER — Other Ambulatory Visit: Payer: Self-pay

## 2018-08-07 DIAGNOSIS — R1011 Right upper quadrant pain: Secondary | ICD-10-CM | POA: Diagnosis not present

## 2018-08-07 DIAGNOSIS — H6983 Other specified disorders of Eustachian tube, bilateral: Secondary | ICD-10-CM | POA: Diagnosis not present

## 2018-08-07 DIAGNOSIS — R109 Unspecified abdominal pain: Secondary | ICD-10-CM | POA: Diagnosis not present

## 2018-08-07 DIAGNOSIS — H6123 Impacted cerumen, bilateral: Secondary | ICD-10-CM | POA: Diagnosis not present

## 2018-08-07 DIAGNOSIS — H6993 Unspecified Eustachian tube disorder, bilateral: Secondary | ICD-10-CM | POA: Insufficient documentation

## 2018-08-07 DIAGNOSIS — R110 Nausea: Secondary | ICD-10-CM | POA: Diagnosis not present

## 2018-08-07 MED ORDER — TECHNETIUM TC 99M MEBROFENIN IV KIT
4.2000 | PACK | Freq: Once | INTRAVENOUS | Status: AC | PRN
  Administered 2018-08-07: 4.2 via INTRAVENOUS

## 2018-08-21 DIAGNOSIS — K625 Hemorrhage of anus and rectum: Secondary | ICD-10-CM | POA: Diagnosis not present

## 2018-08-21 DIAGNOSIS — K621 Rectal polyp: Secondary | ICD-10-CM | POA: Diagnosis not present

## 2018-08-21 DIAGNOSIS — Z1211 Encounter for screening for malignant neoplasm of colon: Secondary | ICD-10-CM | POA: Diagnosis not present

## 2018-08-21 DIAGNOSIS — K5730 Diverticulosis of large intestine without perforation or abscess without bleeding: Secondary | ICD-10-CM | POA: Diagnosis not present

## 2018-08-21 DIAGNOSIS — K219 Gastro-esophageal reflux disease without esophagitis: Secondary | ICD-10-CM | POA: Diagnosis not present

## 2018-08-21 DIAGNOSIS — D128 Benign neoplasm of rectum: Secondary | ICD-10-CM | POA: Diagnosis not present

## 2018-08-26 DIAGNOSIS — H43813 Vitreous degeneration, bilateral: Secondary | ICD-10-CM | POA: Diagnosis not present

## 2018-08-26 DIAGNOSIS — H524 Presbyopia: Secondary | ICD-10-CM | POA: Diagnosis not present

## 2018-08-26 DIAGNOSIS — H5201 Hypermetropia, right eye: Secondary | ICD-10-CM | POA: Diagnosis not present

## 2018-08-26 DIAGNOSIS — H52223 Regular astigmatism, bilateral: Secondary | ICD-10-CM | POA: Diagnosis not present

## 2018-08-26 DIAGNOSIS — H2513 Age-related nuclear cataract, bilateral: Secondary | ICD-10-CM | POA: Diagnosis not present

## 2018-09-03 ENCOUNTER — Other Ambulatory Visit: Payer: Self-pay

## 2018-09-03 ENCOUNTER — Ambulatory Visit (INDEPENDENT_AMBULATORY_CARE_PROVIDER_SITE_OTHER): Payer: Medicare Other | Admitting: Podiatry

## 2018-09-03 ENCOUNTER — Encounter: Payer: Self-pay | Admitting: Podiatry

## 2018-09-03 ENCOUNTER — Ambulatory Visit (INDEPENDENT_AMBULATORY_CARE_PROVIDER_SITE_OTHER): Payer: Medicare Other

## 2018-09-03 VITALS — BP 156/77 | HR 62 | Temp 97.1°F

## 2018-09-03 DIAGNOSIS — M722 Plantar fascial fibromatosis: Secondary | ICD-10-CM

## 2018-09-05 NOTE — Progress Notes (Signed)
   Subjective: 73 y.o. female presenting today as a new patient with a chief complaint of burning pain to the plantar right heel that began a few months ago. Patient states she walks four miles daily and the pain is located in both feet, but worse in the right. She states the pain radiates up her leg and worsens with walking. She has been wearing New Balance shoes but has not taken anything for treatment. Patient is here for further evaluation and treatment.   Past Medical History:  Diagnosis Date  . Anxiety   . Atypical chest pain   . B12 deficiency   . Cervical spondylosis without myelopathy   . Diverticulosis of colon   . Dysesthesia   . GERD (gastroesophageal reflux disease)   . Hypercholesteremia   . Hypertension   . IBS (irritable bowel syndrome)   . Malignant melanoma ([place])   . Vitreous hemorrhage ([facility])      Objective: Physical Exam General: The patient is alert and oriented x3 in no acute distress.  Dermatology: Skin is warm, dry and supple bilateral lower extremities. Negative for open lesions or macerations bilateral.   Vascular: Dorsalis Pedis and Posterior Tibial pulses palpable bilateral.  Capillary fill time is immediate to all digits.  Neurological: Epicritic and protective threshold intact bilateral.   Musculoskeletal: Tenderness to palpation to the plantar aspect of the right heel along the plantar fascia. All other joints range of motion within normal limits bilateral. Strength 5/5 in all groups bilateral.   Radiographic exam: Normal osseous mineralization. Joint spaces preserved. No fracture/dislocation/boney destruction. No other soft tissue abnormalities or radiopaque foreign bodies.   Assessment: 1. Plantar fasciitis right  Plan of Care:  1. Patient evaluated. Xrays reviewed.   2. Injection of 0.5cc Celestone soluspan injected into the right plantar fascia  3. Rx for Medrol Dose Pack placed 4. NSAIDs increase HTN. Recommended OTC Tylenol as needed.   5. Continue wearing New Balance shoes.  6. Instructed patient regarding therapies and modalities at home to alleviate symptoms.  7. Return to clinic in 4 weeks.     Edrick Kins, DPM [facility]  Dr. Edrick Kins, DPM    [address], [place] [address]                Office [phone]  Fax [phone]

## 2018-09-20 DIAGNOSIS — Z85820 Personal history of malignant melanoma of skin: Secondary | ICD-10-CM | POA: Diagnosis not present

## 2018-09-20 DIAGNOSIS — L821 Other seborrheic keratosis: Secondary | ICD-10-CM | POA: Diagnosis not present

## 2018-09-20 DIAGNOSIS — L84 Corns and callosities: Secondary | ICD-10-CM | POA: Diagnosis not present

## 2018-09-20 DIAGNOSIS — L570 Actinic keratosis: Secondary | ICD-10-CM | POA: Diagnosis not present

## 2018-09-20 DIAGNOSIS — D225 Melanocytic nevi of trunk: Secondary | ICD-10-CM | POA: Diagnosis not present

## 2018-09-20 DIAGNOSIS — Z85828 Personal history of other malignant neoplasm of skin: Secondary | ICD-10-CM | POA: Diagnosis not present

## 2018-09-20 DIAGNOSIS — L814 Other melanin hyperpigmentation: Secondary | ICD-10-CM | POA: Diagnosis not present

## 2018-09-20 DIAGNOSIS — D485 Neoplasm of uncertain behavior of skin: Secondary | ICD-10-CM | POA: Diagnosis not present

## 2018-10-01 ENCOUNTER — Ambulatory Visit: Payer: Medicare Other | Admitting: Podiatry

## 2018-10-03 DIAGNOSIS — L821 Other seborrheic keratosis: Secondary | ICD-10-CM | POA: Diagnosis not present

## 2018-10-03 DIAGNOSIS — D485 Neoplasm of uncertain behavior of skin: Secondary | ICD-10-CM | POA: Diagnosis not present

## 2018-10-03 DIAGNOSIS — L570 Actinic keratosis: Secondary | ICD-10-CM | POA: Diagnosis not present

## 2018-10-03 DIAGNOSIS — Z85828 Personal history of other malignant neoplasm of skin: Secondary | ICD-10-CM | POA: Diagnosis not present

## 2018-10-03 DIAGNOSIS — Z85820 Personal history of malignant melanoma of skin: Secondary | ICD-10-CM | POA: Diagnosis not present

## 2018-10-08 DIAGNOSIS — H6122 Impacted cerumen, left ear: Secondary | ICD-10-CM | POA: Diagnosis not present

## 2018-10-08 DIAGNOSIS — H902 Conductive hearing loss, unspecified: Secondary | ICD-10-CM | POA: Diagnosis not present

## 2018-10-08 DIAGNOSIS — H93293 Other abnormal auditory perceptions, bilateral: Secondary | ICD-10-CM | POA: Diagnosis not present

## 2018-11-14 DIAGNOSIS — Z23 Encounter for immunization: Secondary | ICD-10-CM | POA: Diagnosis not present

## 2018-11-15 DIAGNOSIS — Z23 Encounter for immunization: Secondary | ICD-10-CM | POA: Diagnosis not present

## 2018-11-26 DIAGNOSIS — H00024 Hordeolum internum left upper eyelid: Secondary | ICD-10-CM | POA: Diagnosis not present

## 2018-12-06 DIAGNOSIS — E7849 Other hyperlipidemia: Secondary | ICD-10-CM | POA: Diagnosis not present

## 2018-12-06 DIAGNOSIS — E538 Deficiency of other specified B group vitamins: Secondary | ICD-10-CM | POA: Diagnosis not present

## 2018-12-13 DIAGNOSIS — E871 Hypo-osmolality and hyponatremia: Secondary | ICD-10-CM | POA: Diagnosis not present

## 2018-12-13 DIAGNOSIS — R109 Unspecified abdominal pain: Secondary | ICD-10-CM | POA: Diagnosis not present

## 2018-12-13 DIAGNOSIS — Z0000 Encounter for general adult medical examination without abnormal findings: Secondary | ICD-10-CM | POA: Diagnosis not present

## 2018-12-13 DIAGNOSIS — K635 Polyp of colon: Secondary | ICD-10-CM | POA: Diagnosis not present

## 2018-12-13 DIAGNOSIS — C4330 Malignant melanoma of unspecified part of face: Secondary | ICD-10-CM | POA: Diagnosis not present

## 2018-12-13 DIAGNOSIS — K219 Gastro-esophageal reflux disease without esophagitis: Secondary | ICD-10-CM | POA: Diagnosis not present

## 2018-12-13 DIAGNOSIS — F419 Anxiety disorder, unspecified: Secondary | ICD-10-CM | POA: Diagnosis not present

## 2018-12-13 DIAGNOSIS — M722 Plantar fascial fibromatosis: Secondary | ICD-10-CM | POA: Diagnosis not present

## 2018-12-13 DIAGNOSIS — Z1339 Encounter for screening examination for other mental health and behavioral disorders: Secondary | ICD-10-CM | POA: Diagnosis not present

## 2018-12-13 DIAGNOSIS — Z1331 Encounter for screening for depression: Secondary | ICD-10-CM | POA: Diagnosis not present

## 2018-12-13 DIAGNOSIS — I10 Essential (primary) hypertension: Secondary | ICD-10-CM | POA: Diagnosis not present

## 2018-12-13 DIAGNOSIS — E785 Hyperlipidemia, unspecified: Secondary | ICD-10-CM | POA: Diagnosis not present

## 2018-12-13 DIAGNOSIS — K589 Irritable bowel syndrome without diarrhea: Secondary | ICD-10-CM | POA: Diagnosis not present

## 2018-12-13 DIAGNOSIS — E538 Deficiency of other specified B group vitamins: Secondary | ICD-10-CM | POA: Diagnosis not present

## 2018-12-23 DIAGNOSIS — I781 Nevus, non-neoplastic: Secondary | ICD-10-CM | POA: Diagnosis not present

## 2019-01-10 DIAGNOSIS — M542 Cervicalgia: Secondary | ICD-10-CM | POA: Insufficient documentation

## 2019-01-16 DIAGNOSIS — I10 Essential (primary) hypertension: Secondary | ICD-10-CM | POA: Diagnosis not present

## 2019-01-16 DIAGNOSIS — M25539 Pain in unspecified wrist: Secondary | ICD-10-CM | POA: Insufficient documentation

## 2019-01-16 DIAGNOSIS — M25532 Pain in left wrist: Secondary | ICD-10-CM | POA: Diagnosis not present

## 2019-01-16 DIAGNOSIS — Z6827 Body mass index (BMI) 27.0-27.9, adult: Secondary | ICD-10-CM | POA: Diagnosis not present

## 2019-01-16 DIAGNOSIS — M25531 Pain in right wrist: Secondary | ICD-10-CM | POA: Diagnosis not present

## 2019-01-16 DIAGNOSIS — M542 Cervicalgia: Secondary | ICD-10-CM | POA: Diagnosis not present

## 2019-01-20 DIAGNOSIS — L814 Other melanin hyperpigmentation: Secondary | ICD-10-CM | POA: Diagnosis not present

## 2019-01-20 DIAGNOSIS — C44722 Squamous cell carcinoma of skin of right lower limb, including hip: Secondary | ICD-10-CM | POA: Diagnosis not present

## 2019-01-20 DIAGNOSIS — Z85828 Personal history of other malignant neoplasm of skin: Secondary | ICD-10-CM | POA: Diagnosis not present

## 2019-01-20 DIAGNOSIS — L905 Scar conditions and fibrosis of skin: Secondary | ICD-10-CM | POA: Diagnosis not present

## 2019-01-20 DIAGNOSIS — Z85820 Personal history of malignant melanoma of skin: Secondary | ICD-10-CM | POA: Diagnosis not present

## 2019-01-20 DIAGNOSIS — C44321 Squamous cell carcinoma of skin of nose: Secondary | ICD-10-CM | POA: Diagnosis not present

## 2019-01-20 DIAGNOSIS — L821 Other seborrheic keratosis: Secondary | ICD-10-CM | POA: Diagnosis not present

## 2019-01-20 DIAGNOSIS — D225 Melanocytic nevi of trunk: Secondary | ICD-10-CM | POA: Diagnosis not present

## 2019-01-20 DIAGNOSIS — L738 Other specified follicular disorders: Secondary | ICD-10-CM | POA: Diagnosis not present

## 2019-02-03 DIAGNOSIS — C44321 Squamous cell carcinoma of skin of nose: Secondary | ICD-10-CM | POA: Diagnosis not present

## 2019-02-03 DIAGNOSIS — Z85828 Personal history of other malignant neoplasm of skin: Secondary | ICD-10-CM | POA: Diagnosis not present

## 2019-02-03 DIAGNOSIS — K589 Irritable bowel syndrome without diarrhea: Secondary | ICD-10-CM | POA: Diagnosis not present

## 2019-02-03 DIAGNOSIS — I10 Essential (primary) hypertension: Secondary | ICD-10-CM | POA: Diagnosis not present

## 2019-02-03 DIAGNOSIS — E538 Deficiency of other specified B group vitamins: Secondary | ICD-10-CM | POA: Diagnosis not present

## 2019-02-03 DIAGNOSIS — I779 Disorder of arteries and arterioles, unspecified: Secondary | ICD-10-CM | POA: Diagnosis not present

## 2019-02-03 DIAGNOSIS — E785 Hyperlipidemia, unspecified: Secondary | ICD-10-CM | POA: Diagnosis not present

## 2019-02-03 DIAGNOSIS — C4330 Malignant melanoma of unspecified part of face: Secondary | ICD-10-CM | POA: Diagnosis not present

## 2019-02-03 DIAGNOSIS — K219 Gastro-esophageal reflux disease without esophagitis: Secondary | ICD-10-CM | POA: Diagnosis not present

## 2019-02-03 DIAGNOSIS — Z85820 Personal history of malignant melanoma of skin: Secondary | ICD-10-CM | POA: Diagnosis not present

## 2019-02-03 DIAGNOSIS — K635 Polyp of colon: Secondary | ICD-10-CM | POA: Diagnosis not present

## 2019-02-04 DIAGNOSIS — C44321 Squamous cell carcinoma of skin of nose: Secondary | ICD-10-CM | POA: Diagnosis not present

## 2019-02-04 DIAGNOSIS — R82998 Other abnormal findings in urine: Secondary | ICD-10-CM | POA: Diagnosis not present

## 2019-02-05 DIAGNOSIS — N952 Postmenopausal atrophic vaginitis: Secondary | ICD-10-CM | POA: Diagnosis not present

## 2019-02-05 DIAGNOSIS — Z006 Encounter for examination for normal comparison and control in clinical research program: Secondary | ICD-10-CM | POA: Diagnosis not present

## 2019-02-20 DIAGNOSIS — Z1212 Encounter for screening for malignant neoplasm of rectum: Secondary | ICD-10-CM | POA: Diagnosis not present

## 2019-03-03 ENCOUNTER — Other Ambulatory Visit: Payer: Self-pay | Admitting: Obstetrics & Gynecology

## 2019-03-03 DIAGNOSIS — Z1231 Encounter for screening mammogram for malignant neoplasm of breast: Secondary | ICD-10-CM

## 2019-03-04 ENCOUNTER — Ambulatory Visit
Admission: RE | Admit: 2019-03-04 | Discharge: 2019-03-04 | Disposition: A | Discharge status: Home / Self Care | Payer: Medicare Other | Source: Ambulatory Visit | Attending: Obstetrics & Gynecology | Admitting: Obstetrics & Gynecology

## 2019-03-04 ENCOUNTER — Other Ambulatory Visit: Payer: Self-pay

## 2019-03-04 DIAGNOSIS — Z1231 Encounter for screening mammogram for malignant neoplasm of breast: Secondary | ICD-10-CM

## 2019-03-07 DIAGNOSIS — I779 Disorder of arteries and arterioles, unspecified: Secondary | ICD-10-CM | POA: Diagnosis not present

## 2019-03-07 DIAGNOSIS — G4700 Insomnia, unspecified: Secondary | ICD-10-CM | POA: Diagnosis not present

## 2019-03-07 DIAGNOSIS — K635 Polyp of colon: Secondary | ICD-10-CM | POA: Diagnosis not present

## 2019-03-07 DIAGNOSIS — I10 Essential (primary) hypertension: Secondary | ICD-10-CM | POA: Diagnosis not present

## 2019-03-07 DIAGNOSIS — K589 Irritable bowel syndrome without diarrhea: Secondary | ICD-10-CM | POA: Diagnosis not present

## 2019-03-07 DIAGNOSIS — E785 Hyperlipidemia, unspecified: Secondary | ICD-10-CM | POA: Diagnosis not present

## 2019-03-07 DIAGNOSIS — E538 Deficiency of other specified B group vitamins: Secondary | ICD-10-CM | POA: Diagnosis not present

## 2019-03-07 DIAGNOSIS — C4330 Malignant melanoma of unspecified part of face: Secondary | ICD-10-CM | POA: Diagnosis not present

## 2019-03-12 DIAGNOSIS — M25531 Pain in right wrist: Secondary | ICD-10-CM | POA: Diagnosis not present

## 2019-03-12 DIAGNOSIS — M25532 Pain in left wrist: Secondary | ICD-10-CM | POA: Diagnosis not present

## 2019-04-14 DIAGNOSIS — I781 Nevus, non-neoplastic: Secondary | ICD-10-CM | POA: Diagnosis not present

## 2019-04-15 DIAGNOSIS — L821 Other seborrheic keratosis: Secondary | ICD-10-CM | POA: Diagnosis not present

## 2019-04-15 DIAGNOSIS — L814 Other melanin hyperpigmentation: Secondary | ICD-10-CM | POA: Diagnosis not present

## 2019-04-15 DIAGNOSIS — I788 Other diseases of capillaries: Secondary | ICD-10-CM | POA: Diagnosis not present

## 2019-04-15 DIAGNOSIS — Z85820 Personal history of malignant melanoma of skin: Secondary | ICD-10-CM | POA: Diagnosis not present

## 2019-04-15 DIAGNOSIS — L570 Actinic keratosis: Secondary | ICD-10-CM | POA: Diagnosis not present

## 2019-04-15 DIAGNOSIS — Z85828 Personal history of other malignant neoplasm of skin: Secondary | ICD-10-CM | POA: Diagnosis not present

## 2019-05-05 DIAGNOSIS — H902 Conductive hearing loss, unspecified: Secondary | ICD-10-CM | POA: Diagnosis not present

## 2019-05-05 DIAGNOSIS — H6123 Impacted cerumen, bilateral: Secondary | ICD-10-CM | POA: Diagnosis not present

## 2019-05-14 DIAGNOSIS — Z85828 Personal history of other malignant neoplasm of skin: Secondary | ICD-10-CM | POA: Diagnosis not present

## 2019-05-14 DIAGNOSIS — Z808 Family history of malignant neoplasm of other organs or systems: Secondary | ICD-10-CM | POA: Diagnosis not present

## 2019-05-21 ENCOUNTER — Ambulatory Visit: Payer: Medicare Other | Admitting: Cardiovascular Disease

## 2019-05-28 DIAGNOSIS — Z808 Family history of malignant neoplasm of other organs or systems: Secondary | ICD-10-CM | POA: Diagnosis not present

## 2019-05-28 DIAGNOSIS — Z85828 Personal history of other malignant neoplasm of skin: Secondary | ICD-10-CM | POA: Diagnosis not present

## 2019-06-04 ENCOUNTER — Ambulatory Visit: Payer: Medicare Other | Admitting: Cardiovascular Disease

## 2019-06-17 DIAGNOSIS — M47816 Spondylosis without myelopathy or radiculopathy, lumbar region: Secondary | ICD-10-CM | POA: Diagnosis not present

## 2019-06-17 DIAGNOSIS — M5136 Other intervertebral disc degeneration, lumbar region: Secondary | ICD-10-CM | POA: Diagnosis not present

## 2019-06-17 DIAGNOSIS — M5416 Radiculopathy, lumbar region: Secondary | ICD-10-CM | POA: Diagnosis not present

## 2019-06-17 DIAGNOSIS — M549 Dorsalgia, unspecified: Secondary | ICD-10-CM | POA: Diagnosis not present

## 2019-06-17 DIAGNOSIS — M5126 Other intervertebral disc displacement, lumbar region: Secondary | ICD-10-CM | POA: Diagnosis not present

## 2019-06-17 DIAGNOSIS — Q7649 Other congenital malformations of spine, not associated with scoliosis: Secondary | ICD-10-CM | POA: Diagnosis not present

## 2019-06-23 DIAGNOSIS — K635 Polyp of colon: Secondary | ICD-10-CM | POA: Diagnosis not present

## 2019-06-23 DIAGNOSIS — E538 Deficiency of other specified B group vitamins: Secondary | ICD-10-CM | POA: Diagnosis not present

## 2019-06-23 DIAGNOSIS — E871 Hypo-osmolality and hyponatremia: Secondary | ICD-10-CM | POA: Diagnosis not present

## 2019-06-23 DIAGNOSIS — I10 Essential (primary) hypertension: Secondary | ICD-10-CM | POA: Diagnosis not present

## 2019-06-23 DIAGNOSIS — D126 Benign neoplasm of colon, unspecified: Secondary | ICD-10-CM | POA: Diagnosis not present

## 2019-06-23 DIAGNOSIS — I779 Disorder of arteries and arterioles, unspecified: Secondary | ICD-10-CM | POA: Diagnosis not present

## 2019-06-23 DIAGNOSIS — E7849 Other hyperlipidemia: Secondary | ICD-10-CM | POA: Diagnosis not present

## 2019-06-23 DIAGNOSIS — C4330 Malignant melanoma of unspecified part of face: Secondary | ICD-10-CM | POA: Diagnosis not present

## 2019-06-23 DIAGNOSIS — G4700 Insomnia, unspecified: Secondary | ICD-10-CM | POA: Diagnosis not present

## 2019-06-23 DIAGNOSIS — M5416 Radiculopathy, lumbar region: Secondary | ICD-10-CM | POA: Diagnosis not present

## 2019-06-23 DIAGNOSIS — K589 Irritable bowel syndrome without diarrhea: Secondary | ICD-10-CM | POA: Diagnosis not present

## 2019-06-23 DIAGNOSIS — K219 Gastro-esophageal reflux disease without esophagitis: Secondary | ICD-10-CM | POA: Diagnosis not present

## 2019-07-01 ENCOUNTER — Ambulatory Visit: Payer: Medicare Other | Admitting: Cardiovascular Disease

## 2019-07-09 ENCOUNTER — Ambulatory Visit: Payer: Medicare Other | Admitting: Cardiovascular Disease

## 2019-07-21 DIAGNOSIS — L550 Sunburn of first degree: Secondary | ICD-10-CM | POA: Diagnosis not present

## 2019-07-21 DIAGNOSIS — L570 Actinic keratosis: Secondary | ICD-10-CM | POA: Diagnosis not present

## 2019-07-21 DIAGNOSIS — Z85820 Personal history of malignant melanoma of skin: Secondary | ICD-10-CM | POA: Diagnosis not present

## 2019-07-21 DIAGNOSIS — D225 Melanocytic nevi of trunk: Secondary | ICD-10-CM | POA: Diagnosis not present

## 2019-07-21 DIAGNOSIS — D692 Other nonthrombocytopenic purpura: Secondary | ICD-10-CM | POA: Diagnosis not present

## 2019-07-21 DIAGNOSIS — Z85828 Personal history of other malignant neoplasm of skin: Secondary | ICD-10-CM | POA: Diagnosis not present

## 2019-07-21 DIAGNOSIS — L814 Other melanin hyperpigmentation: Secondary | ICD-10-CM | POA: Diagnosis not present

## 2019-07-21 DIAGNOSIS — L821 Other seborrheic keratosis: Secondary | ICD-10-CM | POA: Diagnosis not present

## 2019-07-21 DIAGNOSIS — D1801 Hemangioma of skin and subcutaneous tissue: Secondary | ICD-10-CM | POA: Diagnosis not present

## 2019-07-23 ENCOUNTER — Other Ambulatory Visit: Payer: Self-pay

## 2019-07-23 ENCOUNTER — Ambulatory Visit (INDEPENDENT_AMBULATORY_CARE_PROVIDER_SITE_OTHER): Payer: Medicare Other | Admitting: Cardiovascular Disease

## 2019-07-23 ENCOUNTER — Encounter: Payer: Self-pay | Admitting: Cardiovascular Disease

## 2019-07-23 VITALS — BP 124/78 | HR 72 | Ht 60.0 in | Wt 142.2 lb

## 2019-07-23 DIAGNOSIS — I779 Disorder of arteries and arterioles, unspecified: Secondary | ICD-10-CM

## 2019-07-23 DIAGNOSIS — E782 Mixed hyperlipidemia: Secondary | ICD-10-CM | POA: Diagnosis not present

## 2019-07-23 DIAGNOSIS — I6523 Occlusion and stenosis of bilateral carotid arteries: Secondary | ICD-10-CM | POA: Diagnosis not present

## 2019-07-23 DIAGNOSIS — E785 Hyperlipidemia, unspecified: Secondary | ICD-10-CM | POA: Diagnosis not present

## 2019-07-23 NOTE — Progress Notes (Signed)
Wendy [name] Date of Birth  08-19-1945       [address], [address], [place], Wendy [name]  [address]   [place], [place]  [address] [place]   Fax  [phone]     Fax [phone]  Problem List:  1. Mild carotid artery disease 2  Essential HTN; 3. Hyperlipidemia   History of Present Illness:  Wendy [name] is a 74 yo who I have seen in the past for elevated cholesterol for years.  She was recently at [facility] for [facility] of her TMJ and was noted to have some calcifications in her carotid artery.  She was told to see a cardiologist  She walks 4-5 times a week ( 3 miles at a time)  without chest pain or shortness breath. She denies any syncope or presyncopal episodes.   She was seen in consultation by Victorino Dike and her carotid duplex was negative for obstructive disease.  She has mild bilateral carotid artery disease. She has also seen Dr . Rita Ohara.   She works part time for [employer].   Dec. 19, 2016: Had a hysterectomy and bladder tuck at [facility].  Had some hyponatremia -  Called in a cardiology consult.    Did an echo which was normal  Doing better now.   BP spiked in Nov.  Went to the ER .    was taken off [place].   Was started on amlodipine / valsartan 2.5 / 80 a day  Feb. 2, 2018:  Debralee is seen back for her her yearly visit .   Not exercising much .     Jul 23, 2019:  [name] is seen today after 3-year absence.    Has gained weight in covid.  No CP or dyspnea  Exercises - walks 3-4 miles a day  Has had some plantar fascitis .   Current Outpatient Medications on File Prior to Visit  Medication Sig Dispense Refill  . aspirin 81 MG tablet Take 81 mg by mouth daily.    Marland Kitchen b complex vitamins tablet Take 1 tablet by mouth daily.    . Cholecalciferol (VITAMIN D) 2000 UNITS CAPS Take 1 capsule by mouth daily.    . Coenzyme Q10 (COQ10) 200 MG CAPS Take 1 capsule by mouth  daily.    Marland Kitchen DIGEST ENZYMES-ANTICHOLINERGIC PO Take 1 capsule by mouth 3 (three) times daily.     . Estradiol (YUVAFEM) 10 MCG TABS vaginal tablet Place 1 tablet vaginally as directed. Every other week    . ibuprofen (ADVIL,MOTRIN) 200 MG tablet Take 200 mg by mouth every 6 (six) hours as needed for moderate pain.     Marland Kitchen losartan (COZAAR) 100 MG tablet Take 0.5 tablets (50 mg total) by mouth daily. 90 tablet 3  . metoprolol succinate (TOPROL-XL) 25 MG 24 hr tablet Take 12.5 mg by mouth daily.    . Misc Natural Products (TART CHERRY ADVANCED) CAPS Take 2 capsules by mouth daily.    . Multiple Vitamins-Minerals (MULTIVITAMIN & MINERAL PO) Take 1 tablet by mouth daily.    . Omega-3 Fatty Acids (FISH OIL PO) Take 1 capsule by mouth daily.    . Red Yeast Rice 600 MG CAPS Take 2 capsules by mouth daily.    Marland Kitchen spironolactone (ALDACTONE) 25 MG tablet Take 25 mg by mouth daily.     No current facility-administered medications on file prior to  visit.    Allergies  Allergen Reactions  . Hctz [Hydrochlorothiazide] Other (See Comments)    hyponatremia  . Codeine Nausea Only and Nausea And Vomiting  . Hydrocodone-Acetaminophen Nausea And Vomiting  . Lisinopril Other (See Comments)    REACTION: INTOL to Prinivil w/ pain on urination   . Neomycin Dermatitis  . Other Nausea Only    fragrance  . Oxycodone Nausea And Vomiting  . Pravastatin Sodium Other (See Comments)    REACTION: INTOL to Pravachol w/ myalgias    Past Medical History:  Diagnosis Date  . Anxiety   . Atypical chest pain   . B12 deficiency   . Cervical spondylosis without myelopathy   . Diverticulosis of colon   . Dysesthesia   . GERD (gastroesophageal reflux disease)   . Hypercholesteremia   . Hypertension   . IBS (irritable bowel syndrome)   . Malignant melanoma ([place])   . Vitreous hemorrhage ([facility])     Past Surgical History:  Procedure Laterality Date  . anterior cervical discectomy and fusion  1996   Dr. Sherwood Gambler  .  arthroscopic shoulder surgery    . CESAREAN SECTION    . melanoma surgery from ant neck region  1995  . skin cancer removed from nose      Social History   Tobacco Use  Smoking Status Never Smoker  Smokeless Tobacco Never Used    Social History   Substance and Sexual Activity  Alcohol Use No    Family History  Problem Relation Age of Onset  . Diabetes Mother   . Heart disease Mother   . Hyperlipidemia Mother   . Hypertension Mother   . Cancer Father   . Deep vein thrombosis Father   . Heart attack Father   . Cancer Brother     Reviw of Systems:  Reviewed in the HPI.  All other systems are negative.   Physical Exam: Blood pressure 124/78, pulse 72, height 5' (1.524 m), weight 142 lb 4 oz (64.5 kg), SpO2 97 %.  GEN:  Well nourished, well developed in no acute distress HEENT: Normal NECK: No JVD; No carotid bruits LYMPHATICS: No lymphadenopathy CARDIAC: RRR , no murmurs, rubs, gallops RESPIRATORY:  Clear to auscultation without rales, wheezing or rhonchi  ABDOMEN: Soft, non-tender, non-distended MUSCULOSKELETAL:  No edema; No deformity  SKIN: Warm and dry NEUROLOGIC:  Alert and oriented x 3   ECG: Jul 23, 2019: Normal sinus rhythm at 72.  No ST or T wave changes.  He has no changes from previous EKG.  Assessment / Plan:   1. Mild carotid artery disease-    Will repeat her carotid duplex.  No symptoms .   2  Essential HTN:    BP is well controlled   .  Cont current meds.   She has gained a little weight.  Have advised her to work on weight loss  3. Hyperlipidemia- will check labs in the next several weeks   4. Hyponatremia:     [name], MD  07/25/2019 3:35 PM    Wendy [name],  [address] [place], [place]  [address] Pager [phone] Phone: [phone]; Fax: [phone]

## 2019-07-23 NOTE — Patient Instructions (Addendum)
Medication Instructions:  *If you need a refill on your cardiac medications before your next appointment, please call your pharmacy*  Lab Work: If you have labs (blood work) drawn today and your tests are completely normal, you will receive your results only by: Marland Kitchen MyChart Message (if you have MyChart) OR . A paper copy in the mail If you have any lab test that is abnormal or we need to change your treatment, we will call you to review the results.  Testing/Procedures: Your physician has requested that you have a carotid duplex. This test is an ultrasound of the carotid arteries in your neck. It looks at blood flow through these arteries that supply the brain with blood. Allow one hour for this exam. There are no restrictions or special instructions.  Follow-Up: At [facility], you and your health needs are our priority.  As part of our continuing mission to provide you with exceptional heart care, we have created designated Provider Care Teams.  These Care Teams include your primary Cardiologist (physician) and Advanced Practice Providers (APPs -  Physician Assistants and Nurse Practitioners) who all work together to provide you with the care you need, when you need it.  We recommend signing up for the patient portal called "MyChart".  Sign up information is provided on this After Visit Summary.  MyChart is used to connect with patients for Virtual Visits (Telemedicine).  Patients are able to view lab/test results, encounter notes, upcoming appointments, etc.  Non-urgent messages can be sent to your provider as well.   To learn more about what you can do with MyChart, go to [email or web address].    Your next appointment:   12 month(s)  The format for your next appointment:   In Person  Provider:   You may see Dr. Acie Fredrickson or one of the following Advanced Practice Providers on your designated Care Team:    Richardson Dopp, PA-C  [place], [place]

## 2019-08-14 ENCOUNTER — Telehealth: Payer: Self-pay | Admitting: Nurse Practitioner

## 2019-08-14 ENCOUNTER — Other Ambulatory Visit: Payer: Self-pay

## 2019-08-14 ENCOUNTER — Ambulatory Visit (HOSPITAL_COMMUNITY)
Admission: RE | Admit: 2019-08-14 | Discharge: 2019-08-14 | Disposition: A | Discharge status: Home / Self Care | Payer: Medicare Other | Source: Ambulatory Visit | Attending: Cardiology | Admitting: Cardiology

## 2019-08-14 DIAGNOSIS — I779 Disorder of arteries and arterioles, unspecified: Secondary | ICD-10-CM

## 2019-08-14 DIAGNOSIS — E785 Hyperlipidemia, unspecified: Secondary | ICD-10-CM

## 2019-08-14 NOTE — Telephone Encounter (Signed)
Reviewed results with patient who verbalized understanding. We discussed cholesterol management and she states PCP was pleased with the results. She is intolerant to statins and takes OTC products to lower cholesterol. She states Dr. Acie Fredrickson asked her to come in soon for fasting lipid and she will call back to schedule. She thanked me for the call.

## 2019-08-14 NOTE — Telephone Encounter (Signed)
-----   Message from Fay Records, MD sent at 08/14/2019 12:17 PM EDT ----- Mild plaquings noted in carotid arteries   No significant change

## 2019-09-24 DIAGNOSIS — D1801 Hemangioma of skin and subcutaneous tissue: Secondary | ICD-10-CM | POA: Diagnosis not present

## 2019-09-24 DIAGNOSIS — L821 Other seborrheic keratosis: Secondary | ICD-10-CM | POA: Diagnosis not present

## 2019-09-24 DIAGNOSIS — L578 Other skin changes due to chronic exposure to nonionizing radiation: Secondary | ICD-10-CM | POA: Diagnosis not present

## 2019-09-24 DIAGNOSIS — L570 Actinic keratosis: Secondary | ICD-10-CM | POA: Diagnosis not present

## 2019-09-24 DIAGNOSIS — Z85828 Personal history of other malignant neoplasm of skin: Secondary | ICD-10-CM | POA: Diagnosis not present

## 2019-09-24 DIAGNOSIS — Z85820 Personal history of malignant melanoma of skin: Secondary | ICD-10-CM | POA: Diagnosis not present

## 2019-09-24 DIAGNOSIS — D485 Neoplasm of uncertain behavior of skin: Secondary | ICD-10-CM | POA: Diagnosis not present

## 2019-09-25 DIAGNOSIS — H6120 Impacted cerumen, unspecified ear: Secondary | ICD-10-CM | POA: Diagnosis not present

## 2019-09-25 DIAGNOSIS — H902 Conductive hearing loss, unspecified: Secondary | ICD-10-CM | POA: Diagnosis not present

## 2019-10-07 DIAGNOSIS — Z01419 Encounter for gynecological examination (general) (routine) without abnormal findings: Secondary | ICD-10-CM | POA: Diagnosis not present

## 2019-10-07 DIAGNOSIS — Z6827 Body mass index (BMI) 27.0-27.9, adult: Secondary | ICD-10-CM | POA: Diagnosis not present

## 2019-11-24 DIAGNOSIS — I509 Heart failure, unspecified: Secondary | ICD-10-CM | POA: Diagnosis not present

## 2019-11-24 DIAGNOSIS — I499 Cardiac arrhythmia, unspecified: Secondary | ICD-10-CM | POA: Diagnosis not present

## 2019-11-24 DIAGNOSIS — I259 Chronic ischemic heart disease, unspecified: Secondary | ICD-10-CM | POA: Diagnosis not present

## 2019-11-24 DIAGNOSIS — E7800 Pure hypercholesterolemia, unspecified: Secondary | ICD-10-CM | POA: Diagnosis not present

## 2019-11-24 DIAGNOSIS — I2510 Atherosclerotic heart disease of native coronary artery without angina pectoris: Secondary | ICD-10-CM | POA: Diagnosis not present

## 2019-11-24 DIAGNOSIS — Z1379 Encounter for other screening for genetic and chromosomal anomalies: Secondary | ICD-10-CM | POA: Diagnosis not present

## 2019-11-24 DIAGNOSIS — I4891 Unspecified atrial fibrillation: Secondary | ICD-10-CM | POA: Diagnosis not present

## 2019-11-24 DIAGNOSIS — E7849 Other hyperlipidemia: Secondary | ICD-10-CM | POA: Diagnosis not present

## 2019-11-24 DIAGNOSIS — I10 Essential (primary) hypertension: Secondary | ICD-10-CM | POA: Diagnosis not present

## 2019-11-24 DIAGNOSIS — Z8249 Family history of ischemic heart disease and other diseases of the circulatory system: Secondary | ICD-10-CM | POA: Diagnosis not present

## 2019-11-25 DIAGNOSIS — H60543 Acute eczematoid otitis externa, bilateral: Secondary | ICD-10-CM | POA: Diagnosis not present

## 2019-11-25 DIAGNOSIS — H6123 Impacted cerumen, bilateral: Secondary | ICD-10-CM | POA: Diagnosis not present

## 2019-12-04 DIAGNOSIS — I4891 Unspecified atrial fibrillation: Secondary | ICD-10-CM | POA: Diagnosis not present

## 2019-12-04 DIAGNOSIS — I509 Heart failure, unspecified: Secondary | ICD-10-CM | POA: Diagnosis not present

## 2019-12-04 DIAGNOSIS — I10 Essential (primary) hypertension: Secondary | ICD-10-CM | POA: Diagnosis not present

## 2019-12-04 DIAGNOSIS — Z1379 Encounter for other screening for genetic and chromosomal anomalies: Secondary | ICD-10-CM | POA: Diagnosis not present

## 2019-12-04 DIAGNOSIS — E7800 Pure hypercholesterolemia, unspecified: Secondary | ICD-10-CM | POA: Diagnosis not present

## 2019-12-04 DIAGNOSIS — I2510 Atherosclerotic heart disease of native coronary artery without angina pectoris: Secondary | ICD-10-CM | POA: Diagnosis not present

## 2019-12-04 DIAGNOSIS — I499 Cardiac arrhythmia, unspecified: Secondary | ICD-10-CM | POA: Diagnosis not present

## 2019-12-04 DIAGNOSIS — Z8249 Family history of ischemic heart disease and other diseases of the circulatory system: Secondary | ICD-10-CM | POA: Diagnosis not present

## 2019-12-04 DIAGNOSIS — I259 Chronic ischemic heart disease, unspecified: Secondary | ICD-10-CM | POA: Diagnosis not present

## 2019-12-10 DIAGNOSIS — Z23 Encounter for immunization: Secondary | ICD-10-CM | POA: Diagnosis not present

## 2019-12-16 DIAGNOSIS — E538 Deficiency of other specified B group vitamins: Secondary | ICD-10-CM | POA: Diagnosis not present

## 2019-12-16 DIAGNOSIS — E785 Hyperlipidemia, unspecified: Secondary | ICD-10-CM | POA: Diagnosis not present

## 2019-12-16 DIAGNOSIS — I10 Essential (primary) hypertension: Secondary | ICD-10-CM | POA: Diagnosis not present

## 2019-12-16 DIAGNOSIS — Z0000 Encounter for general adult medical examination without abnormal findings: Secondary | ICD-10-CM | POA: Diagnosis not present

## 2019-12-17 DIAGNOSIS — Q7649 Other congenital malformations of spine, not associated with scoliosis: Secondary | ICD-10-CM | POA: Diagnosis not present

## 2019-12-24 DIAGNOSIS — E871 Hypo-osmolality and hyponatremia: Secondary | ICD-10-CM | POA: Diagnosis not present

## 2019-12-24 DIAGNOSIS — Z1339 Encounter for screening examination for other mental health and behavioral disorders: Secondary | ICD-10-CM | POA: Diagnosis not present

## 2019-12-24 DIAGNOSIS — M722 Plantar fascial fibromatosis: Secondary | ICD-10-CM | POA: Diagnosis not present

## 2019-12-24 DIAGNOSIS — M79609 Pain in unspecified limb: Secondary | ICD-10-CM | POA: Diagnosis not present

## 2019-12-24 DIAGNOSIS — M25571 Pain in right ankle and joints of right foot: Secondary | ICD-10-CM | POA: Diagnosis not present

## 2019-12-24 DIAGNOSIS — Z0000 Encounter for general adult medical examination without abnormal findings: Secondary | ICD-10-CM | POA: Diagnosis not present

## 2019-12-24 DIAGNOSIS — M79671 Pain in right foot: Secondary | ICD-10-CM | POA: Diagnosis not present

## 2019-12-24 DIAGNOSIS — R202 Paresthesia of skin: Secondary | ICD-10-CM | POA: Diagnosis not present

## 2019-12-24 DIAGNOSIS — M5416 Radiculopathy, lumbar region: Secondary | ICD-10-CM | POA: Diagnosis not present

## 2019-12-24 DIAGNOSIS — Z1331 Encounter for screening for depression: Secondary | ICD-10-CM | POA: Diagnosis not present

## 2019-12-24 DIAGNOSIS — M79672 Pain in left foot: Secondary | ICD-10-CM | POA: Diagnosis not present

## 2019-12-24 DIAGNOSIS — I10 Essential (primary) hypertension: Secondary | ICD-10-CM | POA: Diagnosis not present

## 2019-12-24 DIAGNOSIS — K589 Irritable bowel syndrome without diarrhea: Secondary | ICD-10-CM | POA: Diagnosis not present

## 2019-12-24 DIAGNOSIS — M25572 Pain in left ankle and joints of left foot: Secondary | ICD-10-CM | POA: Diagnosis not present

## 2019-12-24 DIAGNOSIS — E785 Hyperlipidemia, unspecified: Secondary | ICD-10-CM | POA: Diagnosis not present

## 2019-12-24 DIAGNOSIS — I6523 Occlusion and stenosis of bilateral carotid arteries: Secondary | ICD-10-CM | POA: Diagnosis not present

## 2019-12-26 DIAGNOSIS — L821 Other seborrheic keratosis: Secondary | ICD-10-CM | POA: Diagnosis not present

## 2019-12-26 DIAGNOSIS — L814 Other melanin hyperpigmentation: Secondary | ICD-10-CM | POA: Diagnosis not present

## 2019-12-26 DIAGNOSIS — L570 Actinic keratosis: Secondary | ICD-10-CM | POA: Diagnosis not present

## 2019-12-26 DIAGNOSIS — D225 Melanocytic nevi of trunk: Secondary | ICD-10-CM | POA: Diagnosis not present

## 2019-12-26 DIAGNOSIS — Z85820 Personal history of malignant melanoma of skin: Secondary | ICD-10-CM | POA: Diagnosis not present

## 2019-12-26 DIAGNOSIS — Z85828 Personal history of other malignant neoplasm of skin: Secondary | ICD-10-CM | POA: Diagnosis not present

## 2019-12-26 DIAGNOSIS — I788 Other diseases of capillaries: Secondary | ICD-10-CM | POA: Diagnosis not present

## 2019-12-26 DIAGNOSIS — C44321 Squamous cell carcinoma of skin of nose: Secondary | ICD-10-CM | POA: Diagnosis not present

## 2020-01-01 DIAGNOSIS — S96911S Strain of unspecified muscle and tendon at ankle and foot level, right foot, sequela: Secondary | ICD-10-CM | POA: Diagnosis not present

## 2020-01-01 DIAGNOSIS — S96212S Strain of intrinsic muscle and tendon at ankle and foot level, left foot, sequela: Secondary | ICD-10-CM | POA: Diagnosis not present

## 2020-01-13 DIAGNOSIS — S96911S Strain of unspecified muscle and tendon at ankle and foot level, right foot, sequela: Secondary | ICD-10-CM | POA: Diagnosis not present

## 2020-01-13 DIAGNOSIS — S96212S Strain of intrinsic muscle and tendon at ankle and foot level, left foot, sequela: Secondary | ICD-10-CM | POA: Diagnosis not present

## 2020-01-20 DIAGNOSIS — Z85828 Personal history of other malignant neoplasm of skin: Secondary | ICD-10-CM | POA: Diagnosis not present

## 2020-01-20 DIAGNOSIS — C44321 Squamous cell carcinoma of skin of nose: Secondary | ICD-10-CM | POA: Diagnosis not present

## 2020-01-20 DIAGNOSIS — Z85820 Personal history of malignant melanoma of skin: Secondary | ICD-10-CM | POA: Diagnosis not present

## 2020-01-27 DIAGNOSIS — L570 Actinic keratosis: Secondary | ICD-10-CM | POA: Diagnosis not present

## 2020-02-10 DIAGNOSIS — Z1212 Encounter for screening for malignant neoplasm of rectum: Secondary | ICD-10-CM | POA: Diagnosis not present

## 2020-02-24 ENCOUNTER — Other Ambulatory Visit: Payer: Self-pay | Admitting: Obstetrics & Gynecology

## 2020-02-24 DIAGNOSIS — Z1231 Encounter for screening mammogram for malignant neoplasm of breast: Secondary | ICD-10-CM

## 2020-03-09 DIAGNOSIS — H6123 Impacted cerumen, bilateral: Secondary | ICD-10-CM | POA: Diagnosis not present

## 2020-03-09 DIAGNOSIS — H900 Conductive hearing loss, bilateral: Secondary | ICD-10-CM | POA: Diagnosis not present

## 2020-04-01 ENCOUNTER — Ambulatory Visit
Admission: RE | Admit: 2020-04-01 | Discharge: 2020-04-01 | Disposition: A | Discharge status: Home / Self Care | Payer: Medicare Other | Source: Ambulatory Visit | Attending: Obstetrics & Gynecology | Admitting: Obstetrics & Gynecology

## 2020-04-01 ENCOUNTER — Other Ambulatory Visit: Payer: Self-pay

## 2020-04-01 DIAGNOSIS — Z85820 Personal history of malignant melanoma of skin: Secondary | ICD-10-CM | POA: Diagnosis not present

## 2020-04-01 DIAGNOSIS — L308 Other specified dermatitis: Secondary | ICD-10-CM | POA: Diagnosis not present

## 2020-04-01 DIAGNOSIS — L814 Other melanin hyperpigmentation: Secondary | ICD-10-CM | POA: Diagnosis not present

## 2020-04-01 DIAGNOSIS — D485 Neoplasm of uncertain behavior of skin: Secondary | ICD-10-CM | POA: Diagnosis not present

## 2020-04-01 DIAGNOSIS — Z85828 Personal history of other malignant neoplasm of skin: Secondary | ICD-10-CM | POA: Diagnosis not present

## 2020-04-01 DIAGNOSIS — Z1231 Encounter for screening mammogram for malignant neoplasm of breast: Secondary | ICD-10-CM

## 2020-04-01 DIAGNOSIS — L570 Actinic keratosis: Secondary | ICD-10-CM | POA: Diagnosis not present

## 2020-04-01 DIAGNOSIS — L821 Other seborrheic keratosis: Secondary | ICD-10-CM | POA: Diagnosis not present

## 2020-05-24 DIAGNOSIS — Z85828 Personal history of other malignant neoplasm of skin: Secondary | ICD-10-CM | POA: Diagnosis not present

## 2020-05-24 DIAGNOSIS — L2089 Other atopic dermatitis: Secondary | ICD-10-CM | POA: Diagnosis not present

## 2020-05-24 DIAGNOSIS — Z85820 Personal history of malignant melanoma of skin: Secondary | ICD-10-CM | POA: Diagnosis not present

## 2020-06-10 DIAGNOSIS — H6123 Impacted cerumen, bilateral: Secondary | ICD-10-CM | POA: Diagnosis not present

## 2020-06-10 DIAGNOSIS — H900 Conductive hearing loss, bilateral: Secondary | ICD-10-CM | POA: Diagnosis not present

## 2020-06-24 DIAGNOSIS — I6523 Occlusion and stenosis of bilateral carotid arteries: Secondary | ICD-10-CM | POA: Diagnosis not present

## 2020-06-24 DIAGNOSIS — E785 Hyperlipidemia, unspecified: Secondary | ICD-10-CM | POA: Diagnosis not present

## 2020-06-24 DIAGNOSIS — M5416 Radiculopathy, lumbar region: Secondary | ICD-10-CM | POA: Diagnosis not present

## 2020-06-24 DIAGNOSIS — E559 Vitamin D deficiency, unspecified: Secondary | ICD-10-CM | POA: Diagnosis not present

## 2020-06-24 DIAGNOSIS — I10 Essential (primary) hypertension: Secondary | ICD-10-CM | POA: Diagnosis not present

## 2020-06-24 DIAGNOSIS — E871 Hypo-osmolality and hyponatremia: Secondary | ICD-10-CM | POA: Diagnosis not present

## 2020-06-24 DIAGNOSIS — E538 Deficiency of other specified B group vitamins: Secondary | ICD-10-CM | POA: Diagnosis not present

## 2020-06-24 DIAGNOSIS — M722 Plantar fascial fibromatosis: Secondary | ICD-10-CM | POA: Diagnosis not present

## 2020-07-06 DIAGNOSIS — K219 Gastro-esophageal reflux disease without esophagitis: Secondary | ICD-10-CM | POA: Diagnosis not present

## 2020-07-06 DIAGNOSIS — K641 Second degree hemorrhoids: Secondary | ICD-10-CM | POA: Diagnosis not present

## 2020-07-06 DIAGNOSIS — K625 Hemorrhage of anus and rectum: Secondary | ICD-10-CM | POA: Diagnosis not present

## 2020-07-06 DIAGNOSIS — R054 Cough syncope: Secondary | ICD-10-CM | POA: Diagnosis not present

## 2020-08-01 IMAGING — NM NUCLEAR MEDICINE HEPATOBILIARY IMAGING WITH GALLBLADDER EF
2 series · 12 of 12 positions shown · non-contrast
Comparison: None.

CLINICAL DATA: Abdominal pain and nausea

EXAM:
NUCLEAR MEDICINE HEPATOBILIARY IMAGING WITH GALLBLADDER EF
VIEWS:
Anterior right upper quadrant.
RADIOPHARMACEUTICALS:  4.2 mCi Cc-RRm  Choletec IV

[Series 1: biliary · 4.14mm/px · 6 of 60 frames shown]
[frame 6/60]
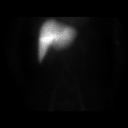
[frame 16/60]
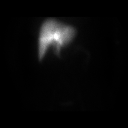
[frame 26/60]
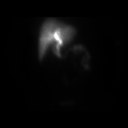
[frame 36/60]
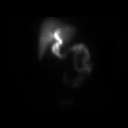
[frame 46/60]
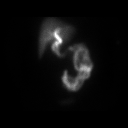
[frame 56/60]
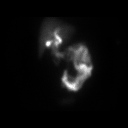

[Series 2: gbef · 4.14mm/px · 6 of 48 frames shown]
[frame 5/48]
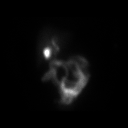
[frame 13/48]
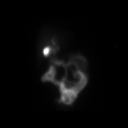
[frame 21/48]
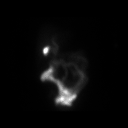
[frame 29/48]
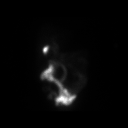
[frame 37/48]
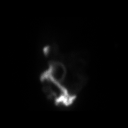
[frame 45/48]
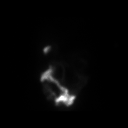

[12 of 12 positions shown; findings below may reference images not displayed]

FINDINGS: Liver uptake of radiotracer is unremarkable. There is prompt
visualization of gallbladder and small bowel, indicating patency of
the cystic and common bile ducts. Patient consumed 8 ounces of
Ensure Complete orally with calculation of the computer generated
ejection fraction of radiotracer from the gallbladder. Patient did
not experience clinical symptoms with the oral Ensure consumption.
The computer generated ejection fraction of radiotracer from the
gallbladder is normal at 60%, normal greater than 33% using the oral
agent.
IMPRESSION: Study within normal limits.

## 2020-08-03 DIAGNOSIS — K5730 Diverticulosis of large intestine without perforation or abscess without bleeding: Secondary | ICD-10-CM | POA: Diagnosis not present

## 2020-08-03 DIAGNOSIS — K641 Second degree hemorrhoids: Secondary | ICD-10-CM | POA: Diagnosis not present

## 2020-08-03 DIAGNOSIS — K5900 Constipation, unspecified: Secondary | ICD-10-CM | POA: Diagnosis not present

## 2020-08-03 DIAGNOSIS — K219 Gastro-esophageal reflux disease without esophagitis: Secondary | ICD-10-CM | POA: Diagnosis not present

## 2020-08-03 DIAGNOSIS — Z86010 Personal history of colonic polyps: Secondary | ICD-10-CM | POA: Diagnosis not present

## 2020-08-04 DIAGNOSIS — L570 Actinic keratosis: Secondary | ICD-10-CM | POA: Diagnosis not present

## 2020-08-04 DIAGNOSIS — L814 Other melanin hyperpigmentation: Secondary | ICD-10-CM | POA: Diagnosis not present

## 2020-08-04 DIAGNOSIS — D225 Melanocytic nevi of trunk: Secondary | ICD-10-CM | POA: Diagnosis not present

## 2020-08-04 DIAGNOSIS — L821 Other seborrheic keratosis: Secondary | ICD-10-CM | POA: Diagnosis not present

## 2020-08-04 DIAGNOSIS — Z85820 Personal history of malignant melanoma of skin: Secondary | ICD-10-CM | POA: Diagnosis not present

## 2020-08-04 DIAGNOSIS — L812 Freckles: Secondary | ICD-10-CM | POA: Diagnosis not present

## 2020-08-04 DIAGNOSIS — Z85828 Personal history of other malignant neoplasm of skin: Secondary | ICD-10-CM | POA: Diagnosis not present

## 2020-09-07 DIAGNOSIS — H43813 Vitreous degeneration, bilateral: Secondary | ICD-10-CM | POA: Diagnosis not present

## 2020-09-13 DIAGNOSIS — H902 Conductive hearing loss, unspecified: Secondary | ICD-10-CM | POA: Diagnosis not present

## 2020-09-13 DIAGNOSIS — H6123 Impacted cerumen, bilateral: Secondary | ICD-10-CM | POA: Diagnosis not present

## 2020-09-24 ENCOUNTER — Other Ambulatory Visit: Payer: Self-pay | Admitting: Neurological Surgery

## 2020-09-24 DIAGNOSIS — M5416 Radiculopathy, lumbar region: Secondary | ICD-10-CM

## 2020-10-12 ENCOUNTER — Ambulatory Visit
Admission: RE | Admit: 2020-10-12 | Discharge: 2020-10-12 | Disposition: A | Discharge status: Home / Self Care | Payer: Medicare Other | Source: Ambulatory Visit | Attending: Neurological Surgery | Admitting: Neurological Surgery

## 2020-10-12 ENCOUNTER — Other Ambulatory Visit: Payer: Self-pay

## 2020-10-12 DIAGNOSIS — M5416 Radiculopathy, lumbar region: Secondary | ICD-10-CM

## 2020-10-12 DIAGNOSIS — M545 Low back pain, unspecified: Secondary | ICD-10-CM | POA: Diagnosis not present

## 2020-10-28 DIAGNOSIS — M5126 Other intervertebral disc displacement, lumbar region: Secondary | ICD-10-CM | POA: Diagnosis not present

## 2020-10-28 DIAGNOSIS — I10 Essential (primary) hypertension: Secondary | ICD-10-CM | POA: Diagnosis not present

## 2020-10-28 DIAGNOSIS — Z6827 Body mass index (BMI) 27.0-27.9, adult: Secondary | ICD-10-CM | POA: Diagnosis not present

## 2020-11-04 DIAGNOSIS — D485 Neoplasm of uncertain behavior of skin: Secondary | ICD-10-CM | POA: Diagnosis not present

## 2020-11-04 DIAGNOSIS — L820 Inflamed seborrheic keratosis: Secondary | ICD-10-CM | POA: Diagnosis not present

## 2020-11-04 DIAGNOSIS — Z85820 Personal history of malignant melanoma of skin: Secondary | ICD-10-CM | POA: Diagnosis not present

## 2020-11-04 DIAGNOSIS — L570 Actinic keratosis: Secondary | ICD-10-CM | POA: Diagnosis not present

## 2020-11-04 DIAGNOSIS — Z85828 Personal history of other malignant neoplasm of skin: Secondary | ICD-10-CM | POA: Diagnosis not present

## 2020-11-04 DIAGNOSIS — L814 Other melanin hyperpigmentation: Secondary | ICD-10-CM | POA: Diagnosis not present

## 2020-11-04 DIAGNOSIS — L309 Dermatitis, unspecified: Secondary | ICD-10-CM | POA: Diagnosis not present

## 2020-11-04 DIAGNOSIS — L821 Other seborrheic keratosis: Secondary | ICD-10-CM | POA: Diagnosis not present

## 2020-11-25 DIAGNOSIS — Z23 Encounter for immunization: Secondary | ICD-10-CM | POA: Diagnosis not present

## 2020-12-06 ENCOUNTER — Ambulatory Visit: Payer: Medicare Other | Admitting: Cardiovascular Disease

## 2020-12-21 DIAGNOSIS — E538 Deficiency of other specified B group vitamins: Secondary | ICD-10-CM | POA: Diagnosis not present

## 2020-12-21 DIAGNOSIS — I10 Essential (primary) hypertension: Secondary | ICD-10-CM | POA: Diagnosis not present

## 2020-12-21 DIAGNOSIS — E559 Vitamin D deficiency, unspecified: Secondary | ICD-10-CM | POA: Diagnosis not present

## 2020-12-21 DIAGNOSIS — E785 Hyperlipidemia, unspecified: Secondary | ICD-10-CM | POA: Diagnosis not present

## 2020-12-27 DIAGNOSIS — Z1331 Encounter for screening for depression: Secondary | ICD-10-CM | POA: Diagnosis not present

## 2020-12-27 DIAGNOSIS — F419 Anxiety disorder, unspecified: Secondary | ICD-10-CM | POA: Diagnosis not present

## 2020-12-27 DIAGNOSIS — D126 Benign neoplasm of colon, unspecified: Secondary | ICD-10-CM | POA: Diagnosis not present

## 2020-12-27 DIAGNOSIS — E785 Hyperlipidemia, unspecified: Secondary | ICD-10-CM | POA: Diagnosis not present

## 2020-12-27 DIAGNOSIS — Z1212 Encounter for screening for malignant neoplasm of rectum: Secondary | ICD-10-CM | POA: Diagnosis not present

## 2020-12-27 DIAGNOSIS — E871 Hypo-osmolality and hyponatremia: Secondary | ICD-10-CM | POA: Diagnosis not present

## 2020-12-27 DIAGNOSIS — G4700 Insomnia, unspecified: Secondary | ICD-10-CM | POA: Diagnosis not present

## 2020-12-27 DIAGNOSIS — Z0000 Encounter for general adult medical examination without abnormal findings: Secondary | ICD-10-CM | POA: Diagnosis not present

## 2020-12-27 DIAGNOSIS — M5416 Radiculopathy, lumbar region: Secondary | ICD-10-CM | POA: Diagnosis not present

## 2020-12-27 DIAGNOSIS — C4330 Malignant melanoma of unspecified part of face: Secondary | ICD-10-CM | POA: Diagnosis not present

## 2020-12-27 DIAGNOSIS — I6523 Occlusion and stenosis of bilateral carotid arteries: Secondary | ICD-10-CM | POA: Diagnosis not present

## 2020-12-27 DIAGNOSIS — R82998 Other abnormal findings in urine: Secondary | ICD-10-CM | POA: Diagnosis not present

## 2020-12-27 DIAGNOSIS — E538 Deficiency of other specified B group vitamins: Secondary | ICD-10-CM | POA: Diagnosis not present

## 2020-12-27 DIAGNOSIS — I10 Essential (primary) hypertension: Secondary | ICD-10-CM | POA: Diagnosis not present

## 2021-01-11 ENCOUNTER — Encounter: Payer: Self-pay | Admitting: Cardiovascular Disease

## 2021-01-11 NOTE — Progress Notes (Signed)
Wendy [name] Date of Birth  1945-04-10       [place] [address], [address], [place], [place]  [address]   [place], [place]  [address] [address]   Fax  [phone]     Fax [phone]  Problem List:  1. Mild carotid artery disease 2  Essential HTN; 3. Hyperlipidemia   Previous notes:   Wendy [name] is a 75 yo who I have seen in the past for elevated cholesterol for years.  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  She was told to see a cardiologist  She walks 4-5 times a week ( 3 miles at a time)  without chest pain or shortness breath. She denies any syncope or presyncopal episodes.   She was seen in consultation by Victorino Dike and her carotid duplex was negative for obstructive disease.  She has mild bilateral carotid artery disease. She has also seen Dr . Rita Ohara.   She works part time for [employer].   Dec. 19, 2016: Had a hysterectomy and bladder tuck at [facility].  Had some hyponatremia -  Called in a cardiology consult.    Did an echo which was normal  Doing better now.   BP spiked in Nov.  Went to the ER .    was taken off [place].   Was started on amlodipine / valsartan 2.5 / 80 a day  Feb. 2, 2018:  Wendy [name] is seen back for her her yearly visit .   Not exercising much .     Jul 23, 2019:  Wendy [name] is seen today after 3-year absence.    Has gained weight in covid.  No CP or dyspnea  Exercises - walks 3-4 miles a day  Has had some plantar fascitis .   Nov. 16, 2022 Wendy [name] is seen for follow up of her HLD, HTN, mild carotid artery disease. Feeling well     Current Outpatient Medications on File Prior to Visit  Medication Sig Dispense Refill   aspirin 81 MG tablet Take 81 mg by mouth daily.     b complex vitamins tablet Take 1 tablet by mouth daily.     Cholecalciferol (VITAMIN D) 2000 UNITS  CAPS Take 1 capsule by mouth daily.     Coenzyme Q10 (COQ10) 200 MG CAPS Take 1 capsule by mouth daily.     DIGEST ENZYMES-ANTICHOLINERGIC PO Take 1 capsule by mouth 3 (three) times daily.      ibuprofen (ADVIL,MOTRIN) 200 MG tablet Take 200 mg by mouth every 6 (six) hours as needed for moderate pain.      losartan (COZAAR) 100 MG tablet Take 0.5 tablets (50 mg total) by mouth daily. 90 tablet 3   metoprolol succinate (TOPROL-XL) 25 MG 24 hr tablet Take 12.5 mg by mouth daily.     Misc Natural Products (TART CHERRY ADVANCED) CAPS Take 2 capsules by mouth daily.     Multiple Vitamins-Minerals (MULTIVITAMIN & MINERAL PO) Take 1 tablet by mouth daily.     Omega-3 Fatty Acids (FISH OIL PO) Take 1 capsule by mouth daily.     Red Yeast Rice 600 MG CAPS Take 2 capsules by mouth daily.     spironolactone (ALDACTONE) 25 MG tablet Take 25 mg by mouth daily.     Estradiol 10 MCG TABS vaginal tablet  Place 1 tablet vaginally as directed. Every other week (Patient not taking: Reported on 01/12/2021)     rosuvastatin (CRESTOR) 5 MG tablet Take 5 mg by mouth once a week.     No current facility-administered medications on file prior to visit.    Allergies  Allergen Reactions   Hctz [Hydrochlorothiazide] Other (See Comments)    hyponatremia   Codeine Nausea Only and Nausea And Vomiting   Hydrocodone-Acetaminophen Nausea And Vomiting   Lisinopril Other (See Comments)    REACTION: INTOL to Prinivil w/ pain on urination    Neomycin Dermatitis   Other Nausea Only    fragrance   Oxycodone Nausea And Vomiting   Pravastatin Sodium Other (See Comments)    REACTION: INTOL to Pravachol w/ myalgias    Past Medical History:  Diagnosis Date   Anxiety    Atypical chest pain    B12 deficiency    Cervical spondylosis without myelopathy    Diverticulosis of colon    Dysesthesia    GERD (gastroesophageal reflux disease)    Hypercholesteremia    Hypertension    IBS (irritable bowel syndrome)    Malignant  melanoma (HCC)    Vitreous hemorrhage (HCC)     Past Surgical History:  Procedure Laterality Date   anterior cervical discectomy and fusion  1996   Dr. Sherwood Gambler   arthroscopic shoulder surgery     CESAREAN SECTION     melanoma surgery from ant neck region  1995   skin cancer removed from nose      Social History   Tobacco Use  Smoking Status Never  Smokeless Tobacco Never    Social History   Substance and Sexual Activity  Alcohol Use No    Family History  Problem Relation Age of Onset   Diabetes Mother    Heart disease Mother    Hyperlipidemia Mother    Hypertension Mother    Cancer Father    Deep vein thrombosis Father    Heart attack Father    Cancer Brother     Reviw of Systems:  Reviewed in the HPI.  All other systems are negative.  Physical Exam: Blood pressure 134/84, pulse 71, height 5' (1.524 m), weight 144 lb 12.8 oz (65.7 kg), SpO2 97 %.  GEN:  Well nourished, well developed in no acute distress HEENT: Normal NECK: No JVD; No carotid bruits LYMPHATICS: No lymphadenopathy CARDIAC: RRR , no murmurs, rubs, gallops RESPIRATORY:  Clear to auscultation without rales, wheezing or rhonchi  ABDOMEN: Soft, non-tender, non-distended MUSCULOSKELETAL:  No edema; No deformity  SKIN: Warm and dry NEUROLOGIC:  Alert and oriented x 3  ECG: January 12, 2021: Normal sinus rhythm at 71.  No ST or T wave changes.  Assessment / Plan:   1. Mild carotid artery disease-      has minimal plaque.   Follow up PRN   2  Essential HTN:    Blood pressure is well controlled.  It is managed by Dr. Forde Dandy.   3. Hyperlipidemia-lipids look overall okay.  This is being managed by Dr. Forde Dandy.   [name], MD  01/12/2021 11:23 AM    [place],  [place], [place]  [address] Pager [phone] Phone: [phone]; Fax: [phone]

## 2021-01-12 ENCOUNTER — Encounter: Payer: Self-pay | Admitting: Cardiovascular Disease

## 2021-01-12 ENCOUNTER — Ambulatory Visit (INDEPENDENT_AMBULATORY_CARE_PROVIDER_SITE_OTHER): Payer: Medicare Other | Admitting: Cardiovascular Disease

## 2021-01-12 ENCOUNTER — Other Ambulatory Visit: Payer: Self-pay

## 2021-01-12 VITALS — BP 134/84 | HR 71 | Ht 60.0 in | Wt 144.8 lb

## 2021-01-12 DIAGNOSIS — E7800 Pure hypercholesterolemia, unspecified: Secondary | ICD-10-CM | POA: Diagnosis not present

## 2021-01-12 DIAGNOSIS — E785 Hyperlipidemia, unspecified: Secondary | ICD-10-CM | POA: Diagnosis not present

## 2021-01-12 DIAGNOSIS — I10 Essential (primary) hypertension: Secondary | ICD-10-CM | POA: Diagnosis not present

## 2021-01-12 DIAGNOSIS — I779 Disorder of arteries and arterioles, unspecified: Secondary | ICD-10-CM | POA: Diagnosis not present

## 2021-01-12 DIAGNOSIS — I6523 Occlusion and stenosis of bilateral carotid arteries: Secondary | ICD-10-CM | POA: Diagnosis not present

## 2021-01-12 NOTE — Patient Instructions (Addendum)
Medication Instructions:  NO CHANGES *If you need a refill on your cardiac medications before your next appointment, please call your pharmacy*   Lab Work: NONE If you have labs (blood work) drawn today and your tests are completely normal, you will receive your results only by: [place] (if you have MyChart) OR A paper copy in the mail If you have any lab test that is abnormal or we need to change your treatment, we will call you to review the results.   Testing/Procedures: NONE   Follow-Up: At [facility], you and your health needs are our priority.  As part of our continuing mission to provide you with exceptional heart care, we have created designated Provider Care Teams.  These Care Teams include your primary Cardiologist (physician) and Advanced Practice Providers (APPs -  Physician Assistants and Nurse Practitioners) who all work together to provide you with the care you need, when you need it.  We recommend signing up for the patient portal called "MyChart".  Sign up information is provided on this After Visit Summary.  MyChart is used to connect with patients for Virtual Visits (Telemedicine).  Patients are able to view lab/test results, encounter notes, upcoming appointments, etc.  Non-urgent messages can be sent to your provider as well.   To learn more about what you can do with MyChart, go to [email or web address].    Your next appointment:   1 YEAR  The format for your next appointment:   In Person  Provider:   None  or Vin Bhagat, PA-C, [employer], PA-C, or [employer], PA-C         Other Instructions NONE

## 2021-01-17 DIAGNOSIS — H902 Conductive hearing loss, unspecified: Secondary | ICD-10-CM | POA: Diagnosis not present

## 2021-01-17 DIAGNOSIS — H6123 Impacted cerumen, bilateral: Secondary | ICD-10-CM | POA: Diagnosis not present

## 2021-02-09 DIAGNOSIS — L570 Actinic keratosis: Secondary | ICD-10-CM | POA: Diagnosis not present

## 2021-02-09 DIAGNOSIS — L821 Other seborrheic keratosis: Secondary | ICD-10-CM | POA: Diagnosis not present

## 2021-02-09 DIAGNOSIS — D692 Other nonthrombocytopenic purpura: Secondary | ICD-10-CM | POA: Diagnosis not present

## 2021-02-09 DIAGNOSIS — Z85820 Personal history of malignant melanoma of skin: Secondary | ICD-10-CM | POA: Diagnosis not present

## 2021-02-09 DIAGNOSIS — L300 Nummular dermatitis: Secondary | ICD-10-CM | POA: Diagnosis not present

## 2021-02-09 DIAGNOSIS — Z85828 Personal history of other malignant neoplasm of skin: Secondary | ICD-10-CM | POA: Diagnosis not present

## 2021-02-09 DIAGNOSIS — L814 Other melanin hyperpigmentation: Secondary | ICD-10-CM | POA: Diagnosis not present

## 2021-02-09 DIAGNOSIS — C44321 Squamous cell carcinoma of skin of nose: Secondary | ICD-10-CM | POA: Diagnosis not present

## 2021-02-17 DIAGNOSIS — K644 Residual hemorrhoidal skin tags: Secondary | ICD-10-CM | POA: Diagnosis not present

## 2021-02-17 DIAGNOSIS — N952 Postmenopausal atrophic vaginitis: Secondary | ICD-10-CM | POA: Diagnosis not present

## 2021-03-15 ENCOUNTER — Ambulatory Visit: Payer: Medicare Other | Admitting: Cardiovascular Disease

## 2021-03-15 DIAGNOSIS — C44321 Squamous cell carcinoma of skin of nose: Secondary | ICD-10-CM | POA: Diagnosis not present

## 2021-03-15 DIAGNOSIS — Z85820 Personal history of malignant melanoma of skin: Secondary | ICD-10-CM | POA: Diagnosis not present

## 2021-03-15 DIAGNOSIS — Z85828 Personal history of other malignant neoplasm of skin: Secondary | ICD-10-CM | POA: Diagnosis not present

## 2021-03-17 ENCOUNTER — Other Ambulatory Visit: Payer: Self-pay | Admitting: Obstetrics & Gynecology

## 2021-03-17 DIAGNOSIS — H6123 Impacted cerumen, bilateral: Secondary | ICD-10-CM | POA: Diagnosis not present

## 2021-03-17 DIAGNOSIS — Z1231 Encounter for screening mammogram for malignant neoplasm of breast: Secondary | ICD-10-CM

## 2021-03-17 DIAGNOSIS — H900 Conductive hearing loss, bilateral: Secondary | ICD-10-CM | POA: Diagnosis not present

## 2021-03-22 DIAGNOSIS — L570 Actinic keratosis: Secondary | ICD-10-CM | POA: Diagnosis not present

## 2021-04-04 ENCOUNTER — Ambulatory Visit
Admission: RE | Admit: 2021-04-04 | Discharge: 2021-04-04 | Disposition: A | Discharge status: Home / Self Care | Payer: Medicare Other | Source: Ambulatory Visit | Attending: Obstetrics & Gynecology | Admitting: Obstetrics & Gynecology

## 2021-04-04 ENCOUNTER — Other Ambulatory Visit: Payer: Self-pay

## 2021-04-04 DIAGNOSIS — Z1231 Encounter for screening mammogram for malignant neoplasm of breast: Secondary | ICD-10-CM | POA: Diagnosis not present

## 2021-04-14 DIAGNOSIS — U071 COVID-19: Secondary | ICD-10-CM | POA: Diagnosis not present

## 2021-04-28 ENCOUNTER — Encounter (HOSPITAL_BASED_OUTPATIENT_CLINIC_OR_DEPARTMENT_OTHER): Payer: Self-pay

## 2021-04-28 ENCOUNTER — Emergency Department (HOSPITAL_BASED_OUTPATIENT_CLINIC_OR_DEPARTMENT_OTHER)
Admission: EM | Admit: 2021-04-28 | Discharge: 2021-04-28 | Disposition: A | Discharge status: Home / Self Care | Payer: Medicare Other | Attending: Emergency Medicine | Admitting: Emergency Medicine

## 2021-04-28 ENCOUNTER — Other Ambulatory Visit (HOSPITAL_BASED_OUTPATIENT_CLINIC_OR_DEPARTMENT_OTHER): Payer: Self-pay

## 2021-04-28 ENCOUNTER — Emergency Department (HOSPITAL_BASED_OUTPATIENT_CLINIC_OR_DEPARTMENT_OTHER): Payer: Medicare Other | Admitting: Radiology

## 2021-04-28 ENCOUNTER — Other Ambulatory Visit: Payer: Self-pay

## 2021-04-28 DIAGNOSIS — Z7982 Long term (current) use of aspirin: Secondary | ICD-10-CM | POA: Diagnosis not present

## 2021-04-28 DIAGNOSIS — Z79899 Other long term (current) drug therapy: Secondary | ICD-10-CM | POA: Diagnosis not present

## 2021-04-28 DIAGNOSIS — R059 Cough, unspecified: Secondary | ICD-10-CM

## 2021-04-28 DIAGNOSIS — U071 COVID-19: Secondary | ICD-10-CM | POA: Diagnosis not present

## 2021-04-28 LAB — RESP PANEL BY RT-PCR (FLU A&B, COVID) ARPGX2
Influenza A by PCR: NEGATIVE
Influenza B by PCR: NEGATIVE
SARS Coronavirus 2 by RT PCR: POSITIVE — AB

## 2021-04-28 MED ORDER — BENZONATATE 100 MG PO CAPS
100.0000 mg | ORAL_CAPSULE | Freq: Three times a day (TID) | ORAL | 0 refills | Status: DC
  Filled 2021-04-28: qty 21, 7d supply, fill #0

## 2021-04-28 NOTE — ED Provider Notes (Signed)
?[facility] EMERGENC[facility] DEPT ?Provider Note ? ? ?CSN: [id number] ?Arrival date & time: 04/28/21  1856 ? ?  ? ?History ? ?Chief Complaint  ?Patient presents with  ? Cough  ? Shortness of Breath  ? ? ?Wendy [name] is a 76 y.o. female. ? ?76 year old female presents with URI symptoms x4 days.  Mild cough without severe shortness of breath.  Had a positive COVID test on Saturday.  Patient was placed on antivirals for this.  Denies any fevers.  Had emesis x1.  No urinary symptoms.  Patient completed her dose of antivirals and does not feel better ? ? ?  ? ?Home Medications ?Prior to Admission medications   ?Medication Sig Start Date End Date Taking? Authorizing Provider  ?aspirin 81 MG tablet Take 81 mg by mouth daily.    [provider]  ?b complex vitamins tablet Take 1 tablet by mouth daily.    [provider]  ?Cholecalciferol (VITAMIN D) 2000 UNITS CAPS Take 1 capsule by mouth daily.    [provider]  ?Coenzyme Q10 (COQ10) 200 MG CAPS Take 1 capsule by mouth daily.    [provider]  ?DIGEST ENZYMES-ANTICHOLINERGIC PO Take 1 capsule by mouth 3 (three) times daily.     [provider]  ?Estradiol 10 MCG TABS vaginal tablet Place 1 tablet vaginally as directed. Every other week ?Patient not taking: Reported on 01/12/2021    [provider]  ?ibuprofen (ADVIL,MOTRIN) 200 MG tablet Take 200 mg by mouth every 6 (six) hours as needed for moderate pain.     [provider]  ?losartan (COZAAR) 100 MG tablet Take 0.5 tablets (50 mg total) by mouth daily. 11/24/16   Nahser, Wonda Cheng, MD  ?metoprolol succinate (TOPROL-XL) 25 MG 24 hr tablet Take 12.5 mg by mouth daily.    [provider]  ?Misc Natural Products (TART CHERRY ADVANCED) CAPS Take 2 capsules by mouth daily.    [provider]  ?Multiple Vitamins-Minerals (MULTIVITAMIN & MINERAL PO) Take 1 tablet by mouth daily.    [provider]  ?Omega-3 Fatty Acids (FISH OIL  PO) Take 1 capsule by mouth daily.    [provider]  ?Red Yeast Rice 600 MG CAPS Take 2 capsules by mouth daily.    [provider]  ?rosuvastatin (CRESTOR) 5 MG tablet Take 5 mg by mouth once a week. 12/27/20   [provider]  ?spironolactone (ALDACTONE) 25 MG tablet Take 25 mg by mouth daily.    [provider]  ?   ? ?Allergies    ?Hctz [hydrochlorothiazide], Codeine, Hydrocodone-acetaminophen, Lisinopril, Neomycin, Other, Oxycodone, and Pravastatin sodium   ? ?Review of Systems   ?Review of Systems  ?All other systems reviewed and are negative. ? ?Physical Exam ?Updated Vital Signs ?BP (!) 157/85 (BP Location: Right Arm)   Pulse (!) 58   Temp 98.1 ?F (36.7 ?C)   Resp 18   SpO2 98%  ?Physical Exam ?Vitals and nursing note reviewed.  ?Constitutional:   ?   General: She is not in acute distress. ?   Appearance: Normal appearance. She is well-developed. She is not toxic-appearing.  ?HENT:  ?   Head: Normocephalic and atraumatic.  ?Eyes:  ?   General: Lids are normal.  ?   Conjunctiva/sclera: Conjunctivae normal.  ?   Pupils: Pupils are equal, round, and reactive to light.  ?Neck:  ?   Thyroid: No thyroid mass.  ?   Trachea: No tracheal deviation.  ?Cardiovascular:  ?  Rate and Rhythm: Normal rate and regular rhythm.  ?   Heart sounds: Normal heart sounds. No murmur heard. ?  No gallop.  ?Pulmonary:  ?   Effort: Pulmonary effort is normal. No respiratory distress.  ?   Breath sounds: Normal breath sounds. No stridor. No decreased breath sounds, wheezing, rhonchi or rales.  ?Abdominal:  ?   General: There is no distension.  ?   Palpations: Abdomen is soft.  ?   Tenderness: There is no abdominal tenderness. There is no rebound.  ?Musculoskeletal:     ?   General: No tenderness. Normal range of motion.  ?   Cervical back: Normal range of motion and neck supple.  ?Skin: ?   General: Skin is warm and dry.  ?   Findings: No abrasion or rash.  ?Neurological:  ?   Mental Status:  She is alert and oriented to person, place, and time. Mental status is at baseline.  ?   GCS: GCS eye subscore is 4. GCS verbal subscore is 5. GCS motor subscore is 6.  ?   Cranial Nerves: No cranial nerve deficit.  ?   Sensory: No sensory deficit.  ?   Motor: Motor function is intact.  ?Psychiatric:     ?   Attention and Perception: Attention normal.     ?   Speech: Speech normal.     ?   Behavior: Behavior normal.  ? ? ?ED Results / Procedures / Treatments   ?Labs ?(all labs ordered are listed, but only abnormal results are displayed) ?Labs Reviewed  ?RESP PANEL BY RT-PCR (FLU A&B, COVID) ARPGX2  ? ? ?EKG ?None ? ?Radiology ?DG Chest 2 View ? ?Result Date: 04/28/2021 ?CLINICAL DATA:  Cough. EXAM: CHEST - 2 VIEW COMPARISON:  Chest x-ray dated April 28, 2014. FINDINGS: The heart remains at the upper limits of normal in size. Normal mediastinal contours. Normal pulmonary vascularity. No focal consolidation, pleural effusion, or pneumothorax. No acute osseous abnormality. IMPRESSION: 1. No acute cardiopulmonary disease. Electronically Signed   By: Titus Dubin M.D.   On: 04/28/2021 09:49   ? ?Procedures ?Procedures  ? ? ?Medications Ordered in ED ?Medications - No data to display ? ?ED Course/ Medical Decision Making/ A&P ?  ?                        ?Medical Decision Making ?Amount and/or Complexity of Data Reviewed ?Radiology: ordered. ? ? ?Patient's chest x-ray per my interpretation showed no signs of acute infiltrate.  Patient just finished her 5-day course of antibiotics for COVID.  COVID and flu test here was positive for COVID.  She is in no respiratory distress at this time.  Does have some laryngitis.  Has had some cough.  Will prescribe Tessalon for her cough.  Do not feel that she needs any other work-up at this time or admission.  Patient will follow-up with her doctor ? ? ? ? ? ? ? ?Final Clinical Impression(s) / ED Diagnoses ?Final diagnoses:  ?Cough  ? ? ?Rx / DC Orders ?ED Discharge Orders   ? ? None   ? ?  ? ? ?  ?Lacretia Leigh, MD ?04/28/21 1054 ? ?

## 2021-04-28 NOTE — ED Triage Notes (Signed)
Pt reports she had a positive home covid test Saturday. Pt completed her dose of 200mg  Molnupiravir BID. Pt presents today d/t no improvement. Denies fever, endorses cough, chest congestion and hoarseness. ? ?Pt is NAD. Pt coughing in triage, sounds thick but unable to get anything to come up   ?

## 2021-04-28 NOTE — ED Notes (Signed)
Patient called for triage/is currently in x ray ?

## 2021-04-30 ENCOUNTER — Other Ambulatory Visit: Payer: Self-pay

## 2021-04-30 ENCOUNTER — Inpatient Hospital Stay
Admission: EM | Admit: 2021-04-30 | Discharge: 2021-05-03 | DRG: 643 | Disposition: A | Discharge status: Home Health | Payer: Medicare Other | Attending: Internal Medicine | Admitting: Internal Medicine

## 2021-04-30 DIAGNOSIS — R490 Dysphonia: Secondary | ICD-10-CM | POA: Diagnosis present

## 2021-04-30 DIAGNOSIS — R509 Fever, unspecified: Secondary | ICD-10-CM | POA: Diagnosis present

## 2021-04-30 DIAGNOSIS — Z833 Family history of diabetes mellitus: Secondary | ICD-10-CM | POA: Diagnosis not present

## 2021-04-30 DIAGNOSIS — E222 Syndrome of inappropriate secretion of antidiuretic hormone: Secondary | ICD-10-CM | POA: Diagnosis present

## 2021-04-30 DIAGNOSIS — Z885 Allergy status to narcotic agent status: Secondary | ICD-10-CM | POA: Diagnosis not present

## 2021-04-30 DIAGNOSIS — Z981 Arthrodesis status: Secondary | ICD-10-CM

## 2021-04-30 DIAGNOSIS — D631 Anemia in chronic kidney disease: Secondary | ICD-10-CM | POA: Diagnosis not present

## 2021-04-30 DIAGNOSIS — E871 Hypo-osmolality and hyponatremia: Secondary | ICD-10-CM | POA: Diagnosis not present

## 2021-04-30 DIAGNOSIS — Z79899 Other long term (current) drug therapy: Secondary | ICD-10-CM

## 2021-04-30 DIAGNOSIS — U071 COVID-19: Secondary | ICD-10-CM | POA: Diagnosis present

## 2021-04-30 DIAGNOSIS — I10 Essential (primary) hypertension: Secondary | ICD-10-CM | POA: Diagnosis not present

## 2021-04-30 DIAGNOSIS — E861 Hypovolemia: Secondary | ICD-10-CM | POA: Diagnosis present

## 2021-04-30 DIAGNOSIS — R059 Cough, unspecified: Secondary | ICD-10-CM | POA: Diagnosis present

## 2021-04-30 DIAGNOSIS — M47812 Spondylosis without myelopathy or radiculopathy, cervical region: Secondary | ICD-10-CM | POA: Diagnosis not present

## 2021-04-30 DIAGNOSIS — F419 Anxiety disorder, unspecified: Secondary | ICD-10-CM | POA: Diagnosis present

## 2021-04-30 DIAGNOSIS — D638 Anemia in other chronic diseases classified elsewhere: Secondary | ICD-10-CM | POA: Diagnosis present

## 2021-04-30 DIAGNOSIS — K219 Gastro-esophageal reflux disease without esophagitis: Secondary | ICD-10-CM | POA: Diagnosis present

## 2021-04-30 DIAGNOSIS — Z8349 Family history of other endocrine, nutritional and metabolic diseases: Secondary | ICD-10-CM

## 2021-04-30 DIAGNOSIS — Z8249 Family history of ischemic heart disease and other diseases of the circulatory system: Secondary | ICD-10-CM

## 2021-04-30 DIAGNOSIS — Z85820 Personal history of malignant melanoma of skin: Secondary | ICD-10-CM

## 2021-04-30 DIAGNOSIS — Z888 Allergy status to other drugs, medicaments and biological substances status: Secondary | ICD-10-CM

## 2021-04-30 DIAGNOSIS — Z7982 Long term (current) use of aspirin: Secondary | ICD-10-CM

## 2021-04-30 DIAGNOSIS — E7800 Pure hypercholesterolemia, unspecified: Secondary | ICD-10-CM | POA: Diagnosis not present

## 2021-04-30 DIAGNOSIS — R112 Nausea with vomiting, unspecified: Secondary | ICD-10-CM

## 2021-04-30 DIAGNOSIS — E876 Hypokalemia: Secondary | ICD-10-CM | POA: Diagnosis not present

## 2021-04-30 DIAGNOSIS — R531 Weakness: Secondary | ICD-10-CM | POA: Diagnosis present

## 2021-04-30 DIAGNOSIS — E538 Deficiency of other specified B group vitamins: Secondary | ICD-10-CM | POA: Diagnosis present

## 2021-04-30 LAB — CBC WITH DIFFERENTIAL/PLATELET
Abs Immature Granulocytes: 0.7 10*3/uL — ABNORMAL HIGH (ref 0.00–0.07)
Basophils Absolute: 0.1 10*3/uL (ref 0.0–0.1)
Basophils Relative: 0 %
Eosinophils Absolute: 0 10*3/uL (ref 0.0–0.5)
Eosinophils Relative: 0 %
HCT: 36.6 % (ref 36.0–46.0)
Hemoglobin: 13.1 g/dL (ref 12.0–15.0)
Immature Granulocytes: 5 %
Lymphocytes Relative: 8 %
Lymphs Abs: 1.1 10*3/uL (ref 0.7–4.0)
MCH: 28.6 pg (ref 26.0–34.0)
MCHC: 35.8 g/dL (ref 30.0–36.0)
MCV: 79.9 fL — ABNORMAL LOW (ref 80.0–100.0)
Monocytes Absolute: 1 10*3/uL (ref 0.1–1.0)
Monocytes Relative: 8 %
Neutro Abs: 10.8 10*3/uL — ABNORMAL HIGH (ref 1.7–7.7)
Neutrophils Relative %: 79 %
Platelets: 356 10*3/uL (ref 150–400)
RBC: 4.58 MIL/uL (ref 3.87–5.11)
RDW: 11 % — ABNORMAL LOW (ref 11.5–15.5)
WBC: 13.7 10*3/uL — ABNORMAL HIGH (ref 4.0–10.5)
nRBC: 0 % (ref 0.0–0.2)

## 2021-04-30 LAB — D-DIMER, QUANTITATIVE: D-Dimer, Quant: 0.98 ug/mL-FEU — ABNORMAL HIGH (ref 0.00–0.50)

## 2021-04-30 LAB — URINALYSIS, ROUTINE W REFLEX MICROSCOPIC
Bilirubin Urine: NEGATIVE
Glucose, UA: 50 mg/dL — AB
Hgb urine dipstick: NEGATIVE
Ketones, ur: 5 mg/dL — AB
Nitrite: NEGATIVE
Protein, ur: NEGATIVE mg/dL
Specific Gravity, Urine: 1.02 (ref 1.005–1.030)
pH: 5 (ref 5.0–8.0)

## 2021-04-30 LAB — COMPREHENSIVE METABOLIC PANEL
ALT: 17 U/L (ref 0–44)
AST: 28 U/L (ref 15–41)
Albumin: 3.5 g/dL (ref 3.5–5.0)
Alkaline Phosphatase: 42 U/L (ref 38–126)
Anion gap: 11 (ref 5–15)
BUN: 9 mg/dL (ref 8–23)
CO2: 24 mmol/L (ref 22–32)
Calcium: 8.4 mg/dL — ABNORMAL LOW (ref 8.9–10.3)
Chloride: 76 mmol/L — ABNORMAL LOW (ref 98–111)
Creatinine, Ser: 0.62 mg/dL (ref 0.44–1.00)
GFR, Estimated: >60 mL/min (ref >60)
Glucose, Bld: 159 mg/dL — ABNORMAL HIGH (ref 70–99)
Potassium: 4.1 mmol/L (ref 3.5–5.1)
Sodium: 111 mmol/L — CL (ref 135–145)
Total Bilirubin: 1.3 mg/dL — ABNORMAL HIGH (ref 0.3–1.2)
Total Protein: 6.2 g/dL — ABNORMAL LOW (ref 6.5–8.1)

## 2021-04-30 LAB — C-REACTIVE PROTEIN: CRP: 1.4 mg/dL — ABNORMAL HIGH (ref <1.0)

## 2021-04-30 LAB — MAGNESIUM: Magnesium: 1.6 mg/dL — ABNORMAL LOW (ref 1.7–2.4)

## 2021-04-30 LAB — LIPASE, BLOOD: Lipase: 34 U/L (ref 11–51)

## 2021-04-30 MED ORDER — ONDANSETRON HCL 4 MG PO TABS: 4.0000 mg | ORAL_TABLET | Freq: Four times a day (QID) | ORAL | Status: DC | PRN

## 2021-04-30 MED ORDER — ASPIRIN EC 81 MG PO TBEC
81.0000 mg | DELAYED_RELEASE_TABLET | Freq: Every day | ORAL | Status: DC
  Administered 2021-04-30 – 2021-05-03 (×4): 81 mg via ORAL
  Filled 2021-04-30 (×4): qty 1

## 2021-04-30 MED ORDER — SODIUM CHLORIDE 0.9 % IV SOLN: INTRAVENOUS | Status: DC

## 2021-04-30 MED ORDER — SODIUM CHLORIDE 0.9 % IV SOLN
200.0000 mg | Freq: Once | INTRAVENOUS | Status: AC
  Administered 2021-04-30: 200 mg via INTRAVENOUS
  Filled 2021-04-30: qty 200

## 2021-04-30 MED ORDER — SODIUM CHLORIDE 0.9 % IV SOLN
100.0000 mg | Freq: Every day | INTRAVENOUS | Status: DC
  Administered 2021-05-01 – 2021-05-02 (×2): 100 mg via INTRAVENOUS
  Filled 2021-04-30: qty 100
  Filled 2021-04-30 (×2): qty 20

## 2021-04-30 MED ORDER — ASCORBIC ACID 500 MG PO TABS
500.0000 mg | ORAL_TABLET | Freq: Every day | ORAL | Status: DC
  Administered 2021-04-30 – 2021-05-03 (×4): 500 mg via ORAL
  Filled 2021-04-30 (×4): qty 1

## 2021-04-30 MED ORDER — ENOXAPARIN SODIUM 40 MG/0.4ML IJ SOSY
40.0000 mg | PREFILLED_SYRINGE | INTRAMUSCULAR | Status: DC
  Administered 2021-04-30 – 2021-05-02 (×3): 40 mg via SUBCUTANEOUS
  Filled 2021-04-30 (×3): qty 0.4

## 2021-04-30 MED ORDER — ONDANSETRON HCL 4 MG/2ML IJ SOLN
4.0000 mg | Freq: Four times a day (QID) | INTRAMUSCULAR | Status: DC | PRN
  Administered 2021-04-30 – 2021-05-01 (×2): 4 mg via INTRAVENOUS
  Filled 2021-04-30 (×2): qty 2

## 2021-04-30 MED ORDER — VITAMIN D 25 MCG (1000 UNIT) PO TABS
2000.0000 [IU] | ORAL_TABLET | Freq: Every day | ORAL | Status: DC
  Administered 2021-05-01 – 2021-05-03 (×3): 2000 [IU] via ORAL
  Filled 2021-04-30 (×3): qty 2

## 2021-04-30 MED ORDER — LOSARTAN POTASSIUM 50 MG PO TABS
50.0000 mg | ORAL_TABLET | Freq: Every day | ORAL | Status: DC
  Administered 2021-04-30 – 2021-05-01 (×2): 50 mg via ORAL
  Filled 2021-04-30 (×2): qty 1

## 2021-04-30 MED ORDER — ACETAMINOPHEN 325 MG PO TABS
650.0000 mg | ORAL_TABLET | Freq: Four times a day (QID) | ORAL | Status: DC | PRN
  Administered 2021-04-30 – 2021-05-02 (×3): 650 mg via ORAL
  Filled 2021-04-30 (×3): qty 2

## 2021-04-30 MED ORDER — ONDANSETRON HCL 4 MG/2ML IJ SOLN
4.0000 mg | Freq: Once | INTRAMUSCULAR | Status: AC
  Administered 2021-04-30: 4 mg via INTRAVENOUS
  Filled 2021-04-30: qty 2

## 2021-04-30 MED ORDER — ZINC SULFATE 220 (50 ZN) MG PO CAPS
220.0000 mg | ORAL_CAPSULE | Freq: Every day | ORAL | Status: DC
  Administered 2021-04-30 – 2021-05-03 (×4): 220 mg via ORAL
  Filled 2021-04-30 (×4): qty 1

## 2021-04-30 MED ORDER — OMEGA-3-ACID ETHYL ESTERS 1 G PO CAPS
1.0000 g | ORAL_CAPSULE | Freq: Every day | ORAL | Status: DC
  Administered 2021-05-01 – 2021-05-03 (×3): 1 g via ORAL
  Filled 2021-04-30 (×3): qty 1

## 2021-04-30 MED ORDER — ALBUTEROL SULFATE HFA 108 (90 BASE) MCG/ACT IN AERS
2.0000 | INHALATION_SPRAY | Freq: Four times a day (QID) | RESPIRATORY_TRACT | Status: DC
  Administered 2021-04-30 – 2021-05-03 (×11): 2 via RESPIRATORY_TRACT
  Filled 2021-04-30: qty 6.7

## 2021-04-30 MED ORDER — PANTOPRAZOLE SODIUM 40 MG IV SOLR
40.0000 mg | INTRAVENOUS | Status: DC
Start: 2021-04-30 — End: 2021-05-01
  Administered 2021-04-30 – 2021-05-01 (×2): 40 mg via INTRAVENOUS
  Filled 2021-04-30 (×2): qty 10

## 2021-04-30 MED ORDER — SODIUM CHLORIDE 0.9 % IV BOLUS (SEPSIS)
1000.0000 mL | Freq: Once | INTRAVENOUS | Status: AC
  Administered 2021-04-30: 1000 mL via INTRAVENOUS

## 2021-04-30 MED ORDER — ADULT MULTIVITAMIN W/MINERALS CH
1.0000 | ORAL_TABLET | Freq: Every day | ORAL | Status: DC
  Administered 2021-05-01 – 2021-05-03 (×3): 1 via ORAL
  Filled 2021-04-30 (×3): qty 1

## 2021-04-30 MED ORDER — GUAIFENESIN-DM 100-10 MG/5ML PO SYRP
10.0000 mL | ORAL_SOLUTION | ORAL | Status: DC | PRN
  Administered 2021-05-01 – 2021-05-03 (×2): 10 mL via ORAL
  Filled 2021-04-30 (×2): qty 10

## 2021-04-30 MED ORDER — METOPROLOL SUCCINATE ER 25 MG PO TB24
12.5000 mg | ORAL_TABLET | Freq: Every day | ORAL | Status: DC
  Administered 2021-04-30 – 2021-05-03 (×4): 12.5 mg via ORAL
  Filled 2021-04-30: qty 0.5
  Filled 2021-04-30 (×3): qty 1

## 2021-04-30 NOTE — ED Triage Notes (Signed)
Covid +  one week ago. Finished antibiotics. New onset nausea/vomiting x 3 days.  ?

## 2021-04-30 NOTE — ED Notes (Signed)
Charge rn samantha notified of sodium 111.  ?

## 2021-04-30 NOTE — Assessment & Plan Note (Addendum)
Patient initial positive COVID-19 PCR test was on 04/23/21 ?She is vaccinated and received 1 booster dose ?She completed a course of Paxlovid but remains symptomatic ?Her COVID-19 cycle threshold is 29.6 ?Patient was placed on remdesivir-already almost 10 days out, ?-Discontinue remdesivir. ?-Continue with supportive care ?-Continue with supplements ?

## 2021-04-30 NOTE — Assessment & Plan Note (Addendum)
Multifactorial with hyponatremia and recent COVID infection. ?-PT/OT is recommending home health ?

## 2021-04-30 NOTE — ED Provider Notes (Signed)
[facility] Provider Note    Event Date/Time   First MD Initiated Contact with Patient 04/30/21 [phone]     (approximate)   History   Cough   HPI  Wendy [name] is a 76 y.o. female with history of hypertension, hyperlipidemia, IBS who presents to the emergency department with fevers, cough, hoarse voice, nausea and vomiting.  She was diagnosed with COVID-19 on Saturday a week ago.  She denies any chest pain or shortness of breath.  No abdominal pain.  No diarrhea.  Has had multiple episodes of nonbloody, nonbilious emesis.  Has had previous C-section.  No other abdominal surgery.  Patient was seen in the emergency department at [facility] on 04/28/2021.  She did complete a course of Paxlovid.   History provided by patient.    Past Medical History:  Diagnosis Date   Anxiety    Atypical chest pain    B12 deficiency    Cervical spondylosis without myelopathy    Diverticulosis of colon    Dysesthesia    GERD (gastroesophageal reflux disease)    Hypercholesteremia    Hypertension    IBS (irritable bowel syndrome)    Malignant melanoma (HCC)    Vitreous hemorrhage (HCC)     Past Surgical History:  Procedure Laterality Date   anterior cervical discectomy and fusion  1996   Dr. Sherwood Gambler   arthroscopic shoulder surgery     CESAREAN SECTION     melanoma surgery from ant neck region  1995   skin cancer removed from nose      MEDICATIONS:  Prior to Admission medications   Medication Sig Start Date End Date Taking? Authorizing Provider  aspirin 81 MG tablet Take 81 mg by mouth daily.    [provider]  b complex vitamins tablet Take 1 tablet by mouth daily.    [provider]  benzonatate (TESSALON) 100 MG capsule Take 1 capsule (100 mg total) by mouth every 8 (eight) hours. 04/28/21   Lacretia Leigh, MD  Cholecalciferol (VITAMIN D) 2000 UNITS CAPS Take 1 capsule by mouth daily.    [provider]  Coenzyme Q10 (COQ10) 200 MG  CAPS Take 1 capsule by mouth daily.    [provider]  DIGEST ENZYMES-ANTICHOLINERGIC PO Take 1 capsule by mouth 3 (three) times daily.     [provider]  Estradiol 10 MCG TABS vaginal tablet Place 1 tablet vaginally as directed. Every other week Patient not taking: Reported on 01/12/2021    [provider]  ibuprofen (ADVIL,MOTRIN) 200 MG tablet Take 200 mg by mouth every 6 (six) hours as needed for moderate pain.     [provider]  losartan (COZAAR) 100 MG tablet Take 0.5 tablets (50 mg total) by mouth daily. 11/24/16   Nahser, Wonda Cheng, MD  metoprolol succinate (TOPROL-XL) 25 MG 24 hr tablet Take 12.5 mg by mouth daily.    [provider]  Misc Natural Products (TART CHERRY ADVANCED) CAPS Take 2 capsules by mouth daily.    [provider]  Multiple Vitamins-Minerals (MULTIVITAMIN & MINERAL PO) Take 1 tablet by mouth daily.    [provider]  Omega-3 Fatty Acids (FISH OIL PO) Take 1 capsule by mouth daily.    [provider]  Red Yeast Rice 600 MG CAPS Take 2 capsules by mouth daily.    [provider]  rosuvastatin (CRESTOR) 5 MG tablet Take 5 mg by mouth once a week. 12/27/20   [provider]  spironolactone (ALDACTONE) 25 MG tablet Take 25 mg by mouth daily.    [provider]    Physical Exam   Triage Vital Signs: ED Triage Vitals  Enc Vitals Group     BP 04/30/21 0547 (!) 185/85     Pulse Rate 04/30/21 0547 74     Resp 04/30/21 0547 18     Temp 04/30/21 0547 98.8 F (37.1 C)     Temp Source 04/30/21 0547 Oral     SpO2 04/30/21 0547 97 %     Weight 04/30/21 0548 140 lb (63.5 kg)     Height 04/30/21 0548 '5\' 2"'$  (1.575 m)     Head Circumference --      Peak Flow --      Pain Score 04/30/21 0548 0     Pain Loc --      Pain Edu? --      Excl. in [place]? --     Most recent vital signs: Vitals:   04/30/21 0547  BP: (!) 185/85  Pulse: 74  Resp: 18  Temp: 98.8 F (37.1 C)   SpO2: 97%    CONSTITUTIONAL: Alert and oriented and responds appropriately to questions. Well-appearing; well-nourished HEAD: Normocephalic, atraumatic EYES: Conjunctivae clear, pupils appear equal, sclera nonicteric ENT: normal nose; moist mucous membranes NECK: Supple, normal ROM CARD: RRR; S1 and S2 appreciated; no murmurs, no clicks, no rubs, no gallops RESP: Normal chest excursion without splinting or tachypnea; breath sounds clear and equal bilaterally; no wheezes, no rhonchi, no rales, no hypoxia or respiratory distress, speaking full sentences ABD/GI: Normal bowel sounds; non-distended; soft, non-tender, no rebound, no guarding, no peritoneal signs BACK: The back appears normal EXT: Normal ROM in all joints; no deformity noted, no edema; no cyanosis SKIN: Normal color for age and race; warm; no rash on exposed skin NEURO: Moves all extremities equally, normal speech PSYCH: The patient's mood and manner are appropriate.   ED Results / Procedures / Treatments   LABS: (all labs ordered are listed, but only abnormal results are displayed) Labs Reviewed  CBC WITH DIFFERENTIAL/PLATELET - Abnormal; Notable for the following components:      Result Value   WBC 13.7 (*)    MCV 79.9 (*)    RDW 11.0 (*)    Neutro Abs 10.8 (*)    Abs Immature Granulocytes 0.70 (*)    All other components within normal limits  COMPREHENSIVE METABOLIC PANEL - Abnormal; Notable for the following components:   Sodium 111 (*)    Chloride 76 (*)    Glucose, Bld 159 (*)    Calcium 8.4 (*)    Total Protein 6.2 (*)    Total Bilirubin 1.3 (*)    All other components within normal limits  MAGNESIUM - Abnormal; Notable for the following components:   Magnesium 1.6 (*)    All other components within normal limits  LIPASE, BLOOD  URINALYSIS, ROUTINE W REFLEX MICROSCOPIC     EKG:  EKG Interpretation  Date/Time:  Saturday April 30 2021 07:03:16 EST Ventricular Rate:  67 PR Interval:  162 QRS  Duration: 84 QT Interval:  418 QTC Calculation: 441 R Axis:   61 Text Interpretation: Normal sinus rhythm Normal ECG No previous ECGs available Confirmed by Pryor Curia [phone]) on 04/30/2021 7:08:50 AM         RADIOLOGY: My personal review and interpretation of imaging:    I have personally reviewed all radiology reports.   No results found.   PROCEDURES:  Critical  Care performed: Yes, see critical care procedure note(s)   CRITICAL CARE Performed by: Pryor Curia   Total critical care time: 45 minutes  Critical care time was exclusive of separately billable procedures and treating other patients.  Critical care was necessary to treat or prevent imminent or life-threatening deterioration.  Critical care was time spent personally by me on the following activities: development of treatment plan with patient and/or surrogate as well as nursing, discussions with consultants, evaluation of patient's response to treatment, examination of patient, obtaining history from patient or surrogate, ordering and performing treatments and interventions, ordering and review of laboratory studies, ordering and review of radiographic studies, pulse oximetry and re-evaluation of patient's condition.   Procedures    IMPRESSION / MDM / ASSESSMENT AND PLAN / ED COURSE  I reviewed the triage vital signs and the nursing notes.    Patient here with nausea and vomiting after recent diagnosis of COVID-19 a week ago.  The patient is on the cardiac monitor to evaluate for evidence of arrhythmia and/or significant heart rate changes.   DIFFERENTIAL DIAGNOSIS (includes but not limited to):   Vomiting from COVID-19, other viral gastroenteritis, dehydration, UTI, doubt appendicitis, colitis, diverticulitis, bowel obstruction, perforation due to benign abdominal exam   PLAN: We will obtain CBC, CMP, lipase, urinalysis.  Will give IV fluids and Zofran.  She denies any chest pain or shortness of  breath.  Her abdominal exam is benign.  I do not feel she needs abdominal imaging.  Chest x-ray obtained on 04/28/2021 showed no acute abnormality.   MEDICATIONS GIVEN IN ED: Medications  0.9 %  sodium chloride infusion (has no administration in time range)  sodium chloride 0.9 % bolus 1,000 mL (1,000 mLs Intravenous New Bag/Given 04/30/21 0626)  ondansetron (ZOFRAN) injection 4 mg (4 mg Intravenous Given 04/30/21 0625)     ED COURSE: Patient's labs show leukocytosis of 13,000 with left shift.  Her sodium is critically low at 111 but she is mentating normally.  Chloride also low.  Suspect this is all from GI losses.  We will add on a magnesium level.  LFTs, lipase normal.  EKG shows no interval abnormalities or ischemia.  We will continue IV hydration and discussed with the hospitalist.   CONSULTS:  Consulted and discussed patient's case with hospitalist, Dr. Francine Graven.  I have recommended admission and consulting physician agrees and will place admission orders.  Patient (and family if present) agree with this plan.   I reviewed all nursing notes, vitals, pertinent previous records.  All labs, EKGs, imaging ordered have been independently reviewed and interpreted by myself.    OUTSIDE RECORDS REVIEWED:  Reviewed last medicine note with Dr. Reynold Bowen on 12/27/20.         FINAL CLINICAL IMPRESSION(S) / ED DIAGNOSES   Final diagnoses:  Hyponatremia  COVID-19  Nausea and vomiting in adult     Rx / DC Orders   ED Discharge Orders     None        Note:  This document was prepared using Dragon voice recognition software and may include unintentional dictation errors.   Jeral Zick, Delice Bison, DO 04/30/21 [phone]

## 2021-04-30 NOTE — Progress Notes (Signed)
Remdesivir - Pharmacy Brief Note ?  ?O:  ?ALT: 17 ?CXR: No acute cardiopulmonary disease. ?SpO2: 97% on RA ?  ?A/P:  ?Remdesivir 200 mg IVPB once followed by 100 mg IVPB daily x 4 days.  ? ?Wynelle Cleveland, PharmD ?Pharmacy Resident  ?04/30/2021 ?9:25 AM ? ? ?

## 2021-04-30 NOTE — H&P (Signed)
History and Physical    Patient: Wendy [name]:[id number] DOB: Oct 28, 1945 DOA: 04/30/2021 DOS: the patient was seen and examined on 04/30/2021 PCP: Reynold Bowen, MD  Patient coming from: Home  Chief Complaint:  Chief Complaint  Patient presents with   Cough    HPI: Wendy [name] is a 76 y.o. female with medical history significant for hypertension, dyslipidemia, anxiety disorder who presents to the ER for evaluation of generalized weakness, persistent nausea and vomiting as well as inability to tolerate any oral intake. Patient states that she has been sick for a week and tested positive for the COVID-19 virus on 04/23/21.  She was placed on Paxlovid and completed the course without any improvement in her symptoms. She was seen at the [facility] ER 2 days prior to this hospitalization for worsening upper respiratory illness and was discharged home. In addition to having persistent nausea and vomiting she also has a dry cough and her voice is hoarse.   She denies having any fever or chills, no abdominal pain, no changes in her bowel habits, no urinary symptoms, no palpitations, no diaphoresis, no dizziness, no lightheadedness, no headache, no chest pain or shortness of breath. Room air pulse oximetry is 97%. Labs revealed serum sodium of 111.   Patient will be admitted to the hospital for further evaluation.  Review of Systems: As mentioned in the history of present illness. All other systems reviewed and are negative. Past Medical History:  Diagnosis Date   Anxiety    Atypical chest pain    B12 deficiency    Cervical spondylosis without myelopathy    Diverticulosis of colon    Dysesthesia    GERD (gastroesophageal reflux disease)    Hypercholesteremia    Hypertension    IBS (irritable bowel syndrome)    Malignant melanoma (HCC)    Vitreous hemorrhage (HCC)    Past Surgical History:  Procedure Laterality Date   anterior cervical discectomy and fusion  1996   Dr.  Sherwood Gambler   arthroscopic shoulder surgery     CESAREAN SECTION     melanoma surgery from ant neck region  1995   skin cancer removed from nose     Social History:  reports that she has never smoked. She has never used smokeless tobacco. She reports that she does not drink alcohol and does not use drugs.  Allergies  Allergen Reactions   Hctz [Hydrochlorothiazide] Other (See Comments)    hyponatremia   Codeine Nausea Only and Nausea And Vomiting   Hydrocodone-Acetaminophen Nausea And Vomiting   Lisinopril Other (See Comments)    REACTION: INTOL to Prinivil w/ pain on urination    Neomycin Dermatitis   Other Nausea Only    fragrance   Oxycodone Nausea And Vomiting   Pravastatin Sodium Other (See Comments)    REACTION: INTOL to Pravachol w/ myalgias    Family History  Problem Relation Age of Onset   Diabetes Mother    Heart disease Mother    Hyperlipidemia Mother    Hypertension Mother    Cancer Father    Deep vein thrombosis Father    Heart attack Father    Cancer Brother     Prior to Admission medications   Medication Sig Start Date End Date Taking? Authorizing Provider  aspirin 81 MG tablet Take 81 mg by mouth daily.    [provider]  b complex vitamins tablet Take 1 tablet by mouth daily.    [provider]  benzonatate (TESSALON) 100  MG capsule Take 1 capsule (100 mg total) by mouth every 8 (eight) hours. 04/28/21   Lacretia Leigh, MD  Cholecalciferol (VITAMIN D) 2000 UNITS CAPS Take 1 capsule by mouth daily.    [provider]  Coenzyme Q10 (COQ10) 200 MG CAPS Take 1 capsule by mouth daily.    [provider]  DIGEST ENZYMES-ANTICHOLINERGIC PO Take 1 capsule by mouth 3 (three) times daily.     [provider]  Estradiol 10 MCG TABS vaginal tablet Place 1 tablet vaginally as directed. Every other week Patient not taking: Reported on 01/12/2021    [provider]  ibuprofen (ADVIL,MOTRIN) 200 MG tablet Take 200 mg by  mouth every 6 (six) hours as needed for moderate pain.     [provider]  losartan (COZAAR) 100 MG tablet Take 0.5 tablets (50 mg total) by mouth daily. 11/24/16   Nahser, Wonda Cheng, MD  metoprolol succinate (TOPROL-XL) 25 MG 24 hr tablet Take 12.5 mg by mouth daily.    [provider]  Misc Natural Products (TART CHERRY ADVANCED) CAPS Take 2 capsules by mouth daily.    [provider]  Multiple Vitamins-Minerals (MULTIVITAMIN & MINERAL PO) Take 1 tablet by mouth daily.    [provider]  Omega-3 Fatty Acids (FISH OIL PO) Take 1 capsule by mouth daily.    [provider]  Red Yeast Rice 600 MG CAPS Take 2 capsules by mouth daily.    [provider]  rosuvastatin (CRESTOR) 5 MG tablet Take 5 mg by mouth once a week. 12/27/20   [provider]  spironolactone (ALDACTONE) 25 MG tablet Take 25 mg by mouth daily.    [provider]    Physical Exam: Vitals:   04/30/21 0547 04/30/21 0548  BP: (!) 185/85   Pulse: 74   Resp: 18   Temp: 98.8 F (37.1 C)   TempSrc: Oral   SpO2: 97%   Weight:  63.5 kg  Height:  '5\' 2"'$  (1.575 m)   Physical Exam Vitals and nursing note reviewed.  Constitutional:      Appearance: Normal appearance. She is normal weight.     Comments: Appears very weak  HENT:     Head: Normocephalic and atraumatic.     Nose: Nose normal.     Mouth/Throat:     Mouth: Mucous membranes are dry.  Eyes:     Pupils: Pupils are equal, round, and reactive to light.  Cardiovascular:     Rate and Rhythm: Regular rhythm.  Pulmonary:     Effort: Pulmonary effort is normal.     Breath sounds: Normal breath sounds.  Abdominal:     General: Abdomen is flat. Bowel sounds are normal.     Palpations: Abdomen is soft.  Musculoskeletal:        General: Normal range of motion.     Cervical back: Normal range of motion and neck supple.  Skin:    General: Skin is warm and dry.  Neurological:     General: No focal  deficit present.     Mental Status: She is alert and oriented to person, place, and time.     Comments: Generalized weakness  Psychiatric:        Mood and Affect: Mood normal.        Behavior: Behavior normal.     Data Reviewed: Relevant notes from primary care and specialist visits, past discharge summaries as available in EHR, including Care Everywhere. Prior diagnostic testing as pertinent  to current admission diagnoses Updated medications and problem lists for reconciliation ED course, including vitals, labs, imaging, treatment and response to treatment Triage notes, nursing and pharmacy notes and ED provider's notes Notable results as noted in HPI Labs reviewed.  Magnesium 1.6, sodium 111, chloride 76, white count 13.7 Twelve-lead EKG reviewed by me shows normal sinus rhythm COVID-19 cycle threshold is 29.6 There are no new results to review at this time.  Assessment and Plan: * Hyponatremia Patient presents for evaluation of generalized weakness and refractory nausea and vomiting and is found to have a serum sodium of 111. Hyponatremia appears to be multifactorial and related to volume depletion from poor oral intake as well as spironolactone use. Hold spironolactone. Hydrate patient with normal saline Repeat sodium levels in a.m.  COVID-19 virus infection Patient initial positive COVID-19 PCR test was on 04/23/21 She is vaccinated and received 1 booster dose She completed a course of Paxlovid but remains symptomatic Her COVID-19 cycle threshold is 29.6 We will place patient on IV remdesivir per protocol Supportive care with IV fluid hydration, antiemetics and IV PPI  Weakness Secondary to hyponatremia Expect improvement in patient's symptoms following resolution of hyponatremia Place patient on fall precautions  GERD We will place patient on IV PPI  HTN (hypertension) Blood pressure is uncontrolled Continue metoprolol and losartan Uptitrate medications to  optimize blood pressure control       Advance Care Planning:   Code Status: Full Code   Consults: PT  Family Communication: Greater than 50% of time was spent discussing patient's condition and plan of care with her at the bedside.  All questions and concerns have been addressed.  She verbalizes understanding and agrees with the plan.  Severity of Illness: The appropriate patient status for this patient is INPATIENT. Inpatient status is judged to be reasonable and necessary in order to provide the required intensity of service to ensure the patient's safety. The patient's presenting symptoms, physical exam findings, and initial radiographic and laboratory data in the context of their chronic comorbidities is felt to place them at high risk for further clinical deterioration. Furthermore, it is not anticipated that the patient will be medically stable for discharge from the hospital within 2 midnights of admission.   * I certify that at the point of admission it is my clinical judgment that the patient will require inpatient hospital care spanning beyond 2 midnights from the point of admission due to high intensity of service, high risk for further deterioration and high frequency of surveillance required.*  Author: Collier Bullock, MD 04/30/2021 9:37 AM  For on call review [email or web address].

## 2021-04-30 NOTE — Assessment & Plan Note (Addendum)
-   Continue with Protonix 

## 2021-04-30 NOTE — ED Notes (Signed)
Hospitalist at bedside 

## 2021-04-30 NOTE — ED Notes (Signed)
Informed RN bed assigned 

## 2021-04-30 NOTE — Assessment & Plan Note (Addendum)
Blood pressure within goal ?-Continue metoprolol and losartan ? ?

## 2021-04-30 NOTE — ED Notes (Signed)
Sodium of 111 called from lab. Dr. Leonides Schanz notified, no new orders received.  ?

## 2021-04-30 NOTE — ED Notes (Signed)
Reminded pt of urine collection needed ?

## 2021-04-30 NOTE — Assessment & Plan Note (Addendum)
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  Nephrology is on board. ?Sodium now improving with fluid restriction. ?-Continue with Lasix ?-Monitor sodium. ?-Restrict fluid. ?-Goal increase of 8-10 in 24-hour, currently at 125. ?

## 2021-05-01 LAB — BASIC METABOLIC PANEL
Anion gap: 7 (ref 5–15)
Anion gap: 9 (ref 5–15)
Anion gap: 6 (ref 5–15)
Anion gap: 8 (ref 5–15)
BUN: 7 mg/dL — ABNORMAL LOW (ref 8–23)
BUN: 8 mg/dL (ref 8–23)
BUN: 6 mg/dL — ABNORMAL LOW (ref 8–23)
BUN: 8 mg/dL (ref 8–23)
CO2: 22 mmol/L (ref 22–32)
CO2: 22 mmol/L (ref 22–32)
CO2: 22 mmol/L (ref 22–32)
CO2: 24 mmol/L (ref 22–32)
Calcium: 7.7 mg/dL — ABNORMAL LOW (ref 8.9–10.3)
Calcium: 7.7 mg/dL — ABNORMAL LOW (ref 8.9–10.3)
Calcium: 7.3 mg/dL — ABNORMAL LOW (ref 8.9–10.3)
Calcium: 8 mg/dL — ABNORMAL LOW (ref 8.9–10.3)
Chloride: 83 mmol/L — ABNORMAL LOW (ref 98–111)
Chloride: 83 mmol/L — ABNORMAL LOW (ref 98–111)
Chloride: 89 mmol/L — ABNORMAL LOW (ref 98–111)
Chloride: 88 mmol/L — ABNORMAL LOW (ref 98–111)
Creatinine, Ser: 0.64 mg/dL (ref 0.44–1.00)
Creatinine, Ser: 0.63 mg/dL (ref 0.44–1.00)
Creatinine, Ser: 0.55 mg/dL (ref 0.44–1.00)
Creatinine, Ser: 0.73 mg/dL (ref 0.44–1.00)
GFR, Estimated: >60 mL/min (ref >60)
GFR, Estimated: >60 mL/min (ref >60)
GFR, Estimated: >60 mL/min (ref >60)
GFR, Estimated: >60 mL/min (ref >60)
Glucose, Bld: 89 mg/dL (ref 70–99)
Glucose, Bld: 103 mg/dL — ABNORMAL HIGH (ref 70–99)
Glucose, Bld: 90 mg/dL (ref 70–99)
Glucose, Bld: 111 mg/dL — ABNORMAL HIGH (ref 70–99)
Potassium: 3.6 mmol/L (ref 3.5–5.1)
Potassium: 3.6 mmol/L (ref 3.5–5.1)
Potassium: 3.2 mmol/L — ABNORMAL LOW (ref 3.5–5.1)
Potassium: 3.4 mmol/L — ABNORMAL LOW (ref 3.5–5.1)
Sodium: 112 mmol/L — CL (ref 135–145)
Sodium: 114 mmol/L — CL (ref 135–145)
Sodium: 117 mmol/L — CL (ref 135–145)
Sodium: 120 mmol/L — ABNORMAL LOW (ref 135–145)

## 2021-05-01 LAB — CBC WITH DIFFERENTIAL/PLATELET
Abs Immature Granulocytes: 0.97 10*3/uL — ABNORMAL HIGH (ref 0.00–0.07)
Basophils Absolute: 0.1 10*3/uL (ref 0.0–0.1)
Basophils Relative: 0 %
Eosinophils Absolute: 0.1 10*3/uL (ref 0.0–0.5)
Eosinophils Relative: 1 %
HCT: 29 % — ABNORMAL LOW (ref 36.0–46.0)
Hemoglobin: 10.5 g/dL — ABNORMAL LOW (ref 12.0–15.0)
Immature Granulocytes: 7 %
Lymphocytes Relative: 11 %
Lymphs Abs: 1.5 10*3/uL (ref 0.7–4.0)
MCH: 28.9 pg (ref 26.0–34.0)
MCHC: 36.2 g/dL — ABNORMAL HIGH (ref 30.0–36.0)
MCV: 79.9 fL — ABNORMAL LOW (ref 80.0–100.0)
Monocytes Absolute: 1.6 10*3/uL — ABNORMAL HIGH (ref 0.1–1.0)
Monocytes Relative: 12 %
Neutro Abs: 9.1 10*3/uL — ABNORMAL HIGH (ref 1.7–7.7)
Neutrophils Relative %: 69 %
Platelets: 330 10*3/uL (ref 150–400)
RBC: 3.63 MIL/uL — ABNORMAL LOW (ref 3.87–5.11)
RDW: 11 % — ABNORMAL LOW (ref 11.5–15.5)
Smear Review: NORMAL
WBC: 13.3 10*3/uL — ABNORMAL HIGH (ref 4.0–10.5)
nRBC: 0 % (ref 0.0–0.2)

## 2021-05-01 LAB — TSH: TSH: 1.464 u[IU]/mL (ref 0.350–4.500)

## 2021-05-01 LAB — COMPREHENSIVE METABOLIC PANEL
ALT: 16 U/L (ref 0–44)
AST: 26 U/L (ref 15–41)
Albumin: 2.8 g/dL — ABNORMAL LOW (ref 3.5–5.0)
Alkaline Phosphatase: 30 U/L — ABNORMAL LOW (ref 38–126)
Anion gap: 4 — ABNORMAL LOW (ref 5–15)
BUN: 6 mg/dL — ABNORMAL LOW (ref 8–23)
CO2: 22 mmol/L (ref 22–32)
Calcium: 7.6 mg/dL — ABNORMAL LOW (ref 8.9–10.3)
Chloride: 83 mmol/L — ABNORMAL LOW (ref 98–111)
Creatinine, Ser: 0.59 mg/dL (ref 0.44–1.00)
GFR, Estimated: >60 mL/min (ref >60)
Glucose, Bld: 90 mg/dL (ref 70–99)
Potassium: 3.4 mmol/L — ABNORMAL LOW (ref 3.5–5.1)
Sodium: 109 mmol/L — CL (ref 135–145)
Total Bilirubin: 0.9 mg/dL (ref 0.3–1.2)
Total Protein: 5.2 g/dL — ABNORMAL LOW (ref 6.5–8.1)

## 2021-05-01 LAB — SODIUM, URINE, RANDOM: Sodium, Ur: 38 mmol/L

## 2021-05-01 LAB — OSMOLALITY, URINE: Osmolality, Ur: 345 mOsm/kg (ref 300–900)

## 2021-05-01 LAB — URIC ACID: Uric Acid, Serum: 2 mg/dL — ABNORMAL LOW (ref 2.5–7.1)

## 2021-05-01 LAB — OSMOLALITY: Osmolality: 240 mOsm/kg — CL (ref 275–295)

## 2021-05-01 LAB — CORTISOL-AM, BLOOD: Cortisol - AM: 22.6 ug/dL (ref 6.7–22.6)

## 2021-05-01 LAB — CREATININE, URINE, RANDOM: Creatinine, Urine: 78 mg/dL

## 2021-05-01 MED ORDER — BENZONATATE 100 MG PO CAPS
200.0000 mg | ORAL_CAPSULE | Freq: Three times a day (TID) | ORAL | Status: DC
  Administered 2021-05-01 – 2021-05-03 (×7): 200 mg via ORAL
  Filled 2021-05-01 (×7): qty 2

## 2021-05-01 MED ORDER — SODIUM CHLORIDE 1 G PO TABS
2.0000 g | ORAL_TABLET | Freq: Two times a day (BID) | ORAL | Status: DC
  Administered 2021-05-01 – 2021-05-03 (×4): 2 g via ORAL
  Filled 2021-05-01 (×5): qty 2

## 2021-05-01 MED ORDER — SALINE SPRAY 0.65 % NA SOLN
1.0000 | NASAL | Status: DC | PRN
  Filled 2021-05-01: qty 44

## 2021-05-01 MED ORDER — FUROSEMIDE 20 MG PO TABS
20.0000 mg | ORAL_TABLET | Freq: Two times a day (BID) | ORAL | Status: DC
Start: 2021-05-01 — End: 2021-05-02
  Administered 2021-05-01 – 2021-05-02 (×3): 20 mg via ORAL
  Filled 2021-05-01 (×3): qty 1

## 2021-05-01 MED ORDER — PANTOPRAZOLE SODIUM 40 MG PO TBEC
40.0000 mg | DELAYED_RELEASE_TABLET | Freq: Every day | ORAL | Status: DC
  Administered 2021-05-02 – 2021-05-03 (×2): 40 mg via ORAL
  Filled 2021-05-01 (×3): qty 1

## 2021-05-01 MED ORDER — POTASSIUM CHLORIDE CRYS ER 10 MEQ PO TBCR
10.0000 meq | EXTENDED_RELEASE_TABLET | Freq: Two times a day (BID) | ORAL | Status: DC
  Administered 2021-05-01: 10 meq via ORAL
  Filled 2021-05-01 (×2): qty 1

## 2021-05-01 MED ORDER — MAGNESIUM SULFATE 2 GM/50ML IV SOLN
2.0000 g | Freq: Once | INTRAVENOUS | Status: AC
  Administered 2021-05-01: 2 g via INTRAVENOUS
  Filled 2021-05-01: qty 50

## 2021-05-01 MED ORDER — FLUTICASONE PROPIONATE 50 MCG/ACT NA SUSP
2.0000 | Freq: Every day | NASAL | Status: DC | PRN
  Administered 2021-05-01: 2 via NASAL
  Filled 2021-05-01 (×2): qty 16

## 2021-05-01 MED ORDER — CALCIUM GLUCONATE-NACL 1-0.675 GM/50ML-% IV SOLN
1.0000 g | Freq: Once | INTRAVENOUS | Status: AC
  Administered 2021-05-01: 1000 mg via INTRAVENOUS
  Filled 2021-05-01: qty 50

## 2021-05-01 NOTE — Progress Notes (Addendum)
PROGRESS NOTE  Wendy [name] CBS:[id number] DOB: 1945/07/16 DOA: 04/30/2021 PCP: Reynold Bowen, MD  HPI/Recap of past 24 hours: Wendy [name] is a 76 y.o. female with medical history significant for hypertension, dyslipidemia, prior history of hyponatremia, who presented to [facility] ED with complaints of generalized weakness, persistent nausea and vomiting as well as inability to tolerate any oral intake.  Tested positive for COVID-19 virus on 04/23/2021.  Completed a course of Paxlovid outpatient.  Work-up revealed severe hypovolemic hyponatremia, COVID-19 screening test positive on 04/28/2021.  Urine lites were obtained, she was started on IV fluid normal saline.  05/01/2021: Patient was seen and examined at bedside.  She reports mild nausea with no vomiting.  She wants her diet to be advanced, liberalizing her diet to regular.  Drop in serum sodium this morning down to 109, nephrology consulted.  TSH normal.  Assessment/Plan: Principal Problem:   Hyponatremia Active Problems:   HTN (hypertension)   GERD   Weakness   COVID-19 virus infection  Severe hypovolemic hyponatremia, suspect multifactorial -Prior history of hyponatremia, however not as severe. -Poor oral intake, on spironolactone and losartan Not on any psych medications currently. TSH normal. Serum sodium on admission 111, downtrended to 109, now uptrending 112. Follow urine osmolality and serum osmolality Repeat serum sodium every 4 hours, no more than 8 mEq correction in a 24-hour cycle. Nephrology consulted to assist with the management.  Hypocalcemia Serum calcium 7.7, albumin 2.8, corrected calcium for albumin 8.7. Calcium gluconate 1 g x 1 dose Repeat BMP in the morning  Hypomagnesemia Serum magnesium 1.6 Repleted intravenously with 2 g of IV magnesium.  Resolved hypokalemia Serum potassium 3.4, repleted, repeat 3.6.  Generalized weakness, suspect multifactorial related to electrolyte imbalance and COVID-19 viral  infection. Continue to treat underlying conditions. PT OT to assess Fall precautions.  Intractable nausea and vomiting suspect secondary to severe hyponatremia. Vomiting has resolved, nausea persists. IV antiemetics as needed Patient is requesting to advance diet Liberalize diet as tolerated Continue to hold off home spironolactone and home losartan due to severe hyponatremia.   COVID-19 virus infection Patient initial positive COVID-19 PCR test was on 04/23/21 She is vaccinated and received 1 booster dose She completed a course of Paxlovid but remains symptomatic Her COVID-19 cycle threshold is 29.6 Continue IV remdesivir per protocol Saline spray as needed for nasal congestion on Flonase nasal spray as needed Tessalon Perles for cough   GERD Switch to oral Protonix.   HTN (hypertension) Blood pressure is at goal. Continue to hold off on spironolactone, losartan. Continue home Toprol-XL. Continue to closely monitor vital signs.             Advance Care Planning:   Code Status: Full Code    Consults: Nephrology, PT/OT   Family Communication: None at bedside.    DVT prophylaxis: Subcu Lovenox daily.  Status is: Inpatient  Patient requires at least 2 midnights for further evaluation and treatment of present condition.            Objective: Vitals:   04/30/21 1752 04/30/21 2211 05/01/21 0327 05/01/21 0837  BP: (!) 154/82 (!) 148/82 135/72 128/64  Pulse: 70 69 64 65  Resp: '17 16 20 16  '$ Temp: 98.3 F (36.8 C) 98.3 F (36.8 C) 98.2 F (36.8 C) 97.7 F (36.5 C)  TempSrc:   Oral   SpO2: 96% 98% 97% 98%  Weight:      Height:        Intake/Output Summary (Last 24 hours)  at 05/01/2021 1032 Last data filed at 05/01/2021 0400 Gross per 24 hour  Intake 2093.18 ml  Output --  Net 2093.18 ml   Filed Weights   04/30/21 0548  Weight: 63.5 kg    Exam:  General: 76 y.o. year-old female well developed well nourished in no acute distress.  Alert and  oriented x3. Cardiovascular: Regular rate and rhythm with no rubs or gallops.  No thyromegaly or JVD noted.   Respiratory: Clear to auscultation with no wheezes or rales. Good inspiratory effort. Abdomen: Soft nontender nondistended with normal bowel sounds x4 quadrants. Musculoskeletal: No lower extremity edema. 2/4 pulses in all 4 extremities. Skin: No ulcerative lesions noted or rashes, Psychiatry: Mood is appropriate for condition and setting Neuro: Moves all 4 extremities.  Nonfocal exam.   Data Reviewed: CBC: Recent Labs  Lab 04/30/21 0607 05/01/21 0416  WBC 13.7* 13.3*  NEUTROABS 10.8* 9.1*  HGB 13.1 10.5*  HCT 36.6 29.0*  MCV 79.9* 79.9*  PLT 356 833   Basic Metabolic Panel: Recent Labs  Lab 04/30/21 0607 05/01/21 0416 05/01/21 0842  NA 111* 109* 112*  K 4.1 3.4* 3.6  CL 76* 83* 83*  CO2 '24 22 22  '$ GLUCOSE 159* 90 89  BUN 9 6* 7*  CREATININE 0.62 0.59 0.64  CALCIUM 8.4* 7.6* 7.7*  MG 1.6*  --   --    GFR: Estimated Creatinine Clearance: 53.2 mL/min (by C-G formula based on SCr of 0.64 mg/dL). Liver Function Tests: Recent Labs  Lab 04/30/21 0607 05/01/21 0416  AST 28 26  ALT 17 16  ALKPHOS 42 30*  BILITOT 1.3* 0.9  PROT 6.2* 5.2*  ALBUMIN 3.5 2.8*   Recent Labs  Lab 04/30/21 0607  LIPASE 34   No results for input(s): AMMONIA in the last 168 hours. Coagulation Profile: No results for input(s): INR, PROTIME in the last 168 hours. Cardiac Enzymes: No results for input(s): CKTOTAL, CKMB, CKMBINDEX, TROPONINI in the last 168 hours. BNP (last 3 results) No results for input(s): PROBNP in the last 8760 hours. HbA1C: No results for input(s): HGBA1C in the last 72 hours. CBG: No results for input(s): GLUCAP in the last 168 hours. Lipid Profile: No results for input(s): CHOL, HDL, LDLCALC, TRIG, CHOLHDL, LDLDIRECT in the last 72 hours. Thyroid Function Tests: Recent Labs    05/01/21 0842  TSH 1.464   Anemia Panel: No results for input(s):  VITAMINB12, FOLATE, FERRITIN, TIBC, IRON, RETICCTPCT in the last 72 hours. Urine analysis:    Component Value Date/Time   COLORURINE YELLOW (A) 04/30/2021 1304   APPEARANCEUR HAZY (A) 04/30/2021 1304   LABSPEC 1.020 04/30/2021 1304   PHURINE 5.0 04/30/2021 1304   GLUCOSEU 50 (A) 04/30/2021 1304   GLUCOSEU NEGATIVE 04/18/2013 1106   HGBUR NEGATIVE 04/30/2021 1304   BILIRUBINUR NEGATIVE 04/30/2021 1304   KETONESUR 5 (A) 04/30/2021 1304   PROTEINUR NEGATIVE 04/30/2021 1304   UROBILINOGEN 0.2 04/18/2013 1106   NITRITE NEGATIVE 04/30/2021 1304   LEUKOCYTESUR TRACE (A) 04/30/2021 1304   Sepsis Labs: '@LABRCNTIP'$ (procalcitonin:4,lacticidven:4)  ) Recent Results (from the past 240 hour(s))  Resp Panel by RT-PCR (Flu A&B, Covid) Nasopharyngeal Swab     Status: Abnormal   Collection Time: 04/28/21  9:55 AM   Specimen: Nasopharyngeal Swab; Nasopharyngeal(NP) swabs in vial transport medium  Result Value Ref Range Status   SARS Coronavirus 2 by RT PCR POSITIVE (A) NEGATIVE Final    Comment: (NOTE) SARS-CoV-2 target nucleic acids are DETECTED.  The SARS-CoV-2 RNA is generally detectable  in upper respiratory specimens during the acute phase of infection. Positive results are indicative of the presence of the identified virus, but do not rule out bacterial infection or co-infection with other pathogens not detected by the test. Clinical correlation with patient history and other diagnostic information is necessary to determine patient infection status. The expected result is Negative.  Fact Sheet for Patients: EntrepreneurPulse.com.au  Fact Sheet for Healthcare Providers: IncredibleEmployment.be  This test is not yet approved or cleared by the [place] FDA and  has been authorized for detection and/or diagnosis of SARS-CoV-2 by FDA under an Emergency Use Authorization (EUA).  This EUA will remain in effect (meaning this test can be used) for the  duration of  the COVID-19 declaration under Section 564(b)(1) of the A ct, 21 U.S.C. section 360bbb-3(b)(1), unless the authorization is terminated or revoked sooner.     Influenza A by PCR NEGATIVE NEGATIVE Final   Influenza B by PCR NEGATIVE NEGATIVE Final    Comment: (NOTE) The Xpert Xpress SARS-CoV-2/FLU/RSV plus assay is intended as an aid in the diagnosis of influenza from Nasopharyngeal swab specimens and should not be used as a sole basis for treatment. Nasal washings and aspirates are unacceptable for Xpert Xpress SARS-CoV-2/FLU/RSV testing.  Fact Sheet for Patients: EntrepreneurPulse.com.au  Fact Sheet for Healthcare Providers: IncredibleEmployment.be  This test is not yet approved or cleared by the [place] FDA and has been authorized for detection and/or diagnosis of SARS-CoV-2 by FDA under an Emergency Use Authorization (EUA). This EUA will remain in effect (meaning this test can be used) for the duration of the COVID-19 declaration under Section 564(b)(1) of the Act, 21 U.S.C. section 360bbb-3(b)(1), unless the authorization is terminated or revoked.  Performed at [facility], [address], [place], [place] [address]       Studies: No results found.  Scheduled Meds:  albuterol  2 puff Inhalation Q6H   vitamin C  500 mg Oral Daily   aspirin EC  81 mg Oral Daily   cholecalciferol  2,000 Units Oral Daily   enoxaparin (LOVENOX) injection  40 mg Subcutaneous Q24H   losartan  50 mg Oral Daily   metoprolol succinate  12.5 mg Oral Daily   multivitamin with minerals  1 tablet Oral Daily   omega-3 acid ethyl esters  1 g Oral Daily   pantoprazole (PROTONIX) IV  40 mg Intravenous Q24H   zinc sulfate  220 mg Oral Daily    Continuous Infusions:  sodium chloride 100 mL/hr at 05/01/21 0327   remdesivir 100 mg in NS 100 mL       LOS: 1 day     Kayleen Memos, MD [facility] Pager  [phone]  If 7PM-7AM, please contact night-coverage [email or web address] Password [facility] 05/01/2021, 10:32 AM

## 2021-05-01 NOTE — Progress Notes (Addendum)
PT Cancellation Note ? ?Patient Details ?Name: Wendy [name] ?MRN: [id number] ?DOB: 1945-07-03 ? ? ?Cancelled Treatment:    Reason Eval/Treat Not Completed: Patient not medically ready. PT orders received and pt chart reviewed. Pt currently with hyponatremia of 109 as of 3/5 @ 0416. Therapy contraindicated with Na < 120. Will follow up with therapy intervention, as medically appropriate. ? ?ADDENDUM 1321: Per chart review, pt continues to present with hyponatremia. BMP continues to be abnormal with Na at 114 (@ 1221). Will hold therapy intervention today, and follow up tomorrow as medically appropriate. ? ? ? ? ?Herminio Commons, PT, DPT ?8:06 AM,05/01/21 ? ? ?Herminio Commons, PT, DPT ?1:23 PM,05/01/21 ? ? ?

## 2021-05-01 NOTE — Consult Note (Addendum)
Wendy [name] MRN: [id number] DOB/AGE: 76-Jul-1947 76 y.o. Primary Care Physician:South, Jeannett Senior, MD Admit date: 04/30/2021 Chief Complaint:  Chief Complaint  Patient presents with   Cough   HPI: Patient is a 76 year old Caucasian female with a past medical history of hypertension, hyperlipidemia, GERD who came to the ER with chief complaint of shortness of breath.  History of present illness date backs to around 6 o days when patient at home tested herself for COVID.  Patient was indeed COVID-positive and was later found given molnupiravir.  Patient did complete the dose of her antivirals but continued to have shortness of breath and cough Patient also complained of vomiting. Upon evaluation in the ER on March 4 patient was found to have sodium of 111 Patient was admitted for further treatment Patient was thought to have hyponatremia from multiple factors including volume depletion Patient was started on normal saline Nephrology was consulted today. Patient was seen today on the first floor Patient has been was present in the room as well Patient continues to complain of cough Patient offers no complaint of fever Patient and her husband offers no complaint of confusion No complaint of unsteady gait No complaint of change in mood No complaint of change in speech No complaint of hematuria Patient does complain of occasional diarrhea   Past Medical History:  Diagnosis Date   Anxiety    Atypical chest pain    B12 deficiency    Cervical spondylosis without myelopathy    Diverticulosis of colon    Dysesthesia    GERD (gastroesophageal reflux disease)    Hypercholesteremia    Hypertension    IBS (irritable bowel syndrome)    Malignant melanoma (HCC)    Vitreous hemorrhage (HCC)         Family History  Problem Relation Age of Onset   Diabetes Mother    Heart disease Mother    Hyperlipidemia Mother    Hypertension Mother    Cancer Father    Deep vein thrombosis Father     Heart attack Father    Cancer Brother     Social History:  reports that she has never smoked. She has never used smokeless tobacco. She reports that she does not drink alcohol and does not use drugs.   Allergies:  Allergies  Allergen Reactions   Hctz [Hydrochlorothiazide] Other (See Comments)    hyponatremia   Codeine Nausea Only and Nausea And Vomiting   Hydrocodone-Acetaminophen Nausea And Vomiting   Lisinopril Other (See Comments)    REACTION: INTOL to Prinivil w/ pain on urination    Neomycin Dermatitis   Other Nausea Only    fragrance   Oxycodone Nausea And Vomiting   Pravastatin Sodium Other (See Comments)    REACTION: INTOL to Pravachol w/ myalgias    Medications Prior to Admission  Medication Sig Dispense Refill   aspirin 81 MG tablet Take 81 mg by mouth daily.     b complex vitamins tablet Take 1 tablet by mouth daily.     benzonatate (TESSALON) 100 MG capsule Take 1 capsule (100 mg total) by mouth every 8 (eight) hours. 21 capsule 0   Cholecalciferol (VITAMIN D) 2000 UNITS CAPS Take 1 capsule by mouth daily.     Coenzyme Q10 (COQ10) 200 MG CAPS Take 1 capsule by mouth daily.     DIGEST ENZYMES-ANTICHOLINERGIC PO Take 1 capsule by mouth 3 (three) times daily.      Estradiol 10 MCG TABS vaginal tablet Place 1 tablet vaginally as directed.  Every other week (Patient not taking: Reported on 01/12/2021)     ibuprofen (ADVIL,MOTRIN) 200 MG tablet Take 200 mg by mouth every 6 (six) hours as needed for moderate pain.      losartan (COZAAR) 100 MG tablet Take 0.5 tablets (50 mg total) by mouth daily. 90 tablet 3   losartan (COZAAR) 50 MG tablet Take 50 mg by mouth daily.     metoprolol succinate (TOPROL-XL) 25 MG 24 hr tablet Take 12.5 mg by mouth daily.     Misc Natural Products (TART CHERRY ADVANCED) CAPS Take 2 capsules by mouth daily.     Multiple Vitamins-Minerals (MULTIVITAMIN & MINERAL PO) Take 1 tablet by mouth daily.     Omega-3 Fatty Acids (FISH OIL PO) Take 1  capsule by mouth daily.     ondansetron (ZOFRAN) 4 MG tablet Take 4 mg by mouth every 8 (eight) hours as needed.     Red Yeast Rice 600 MG CAPS Take 2 capsules by mouth daily.     rosuvastatin (CRESTOR) 5 MG tablet Take 5 mg by mouth once a week.     spironolactone (ALDACTONE) 25 MG tablet Take 25 mg by mouth daily.         ZOX:WRUEA from the symptoms mentioned above,there are no other symptoms referable to all systems reviewed.   albuterol  2 puff Inhalation Q6H   vitamin C  500 mg Oral Daily   aspirin EC  81 mg Oral Daily   cholecalciferol  2,000 Units Oral Daily   enoxaparin (LOVENOX) injection  40 mg Subcutaneous Q24H   losartan  50 mg Oral Daily   metoprolol succinate  12.5 mg Oral Daily   multivitamin with minerals  1 tablet Oral Daily   omega-3 acid ethyl esters  1 g Oral Daily   pantoprazole (PROTONIX) IV  40 mg Intravenous Q24H   zinc sulfate  220 mg Oral Daily     Physical Exam: Vital signs in last 24 hours: Temp:  [97.7 F (36.5 C)-98.6 F (37 C)] 97.7 F (36.5 C) (03/05 0837) Pulse Rate:  [64-78] 65 (03/05 0837) Resp:  [16-20] 16 (03/05 0837) BP: (128-164)/(64-83) 128/64 (03/05 0837) SpO2:  [96 %-98 %] 98 % (03/05 0837) Weight change:     Intake/Output from previous day: 03/04 0701 - 03/05 0700 In: 2093.2 [P.O.:480; I.V.:1613.2] Out: -  No intake/output data recorded.   Physical Exam: General- pt is awake,alert, oriented to time place and person Resp- No acute REsp distress, CTA B/L NO Rhonchi CVS- S1S2 regular ij rate and rhythm GIT- BS+, soft, NT, ND EXT- NO LE Edema, Cyanosis CNS- CN 2-12 grossly intact. Moving all 4 extremities Psych- normal mood and affect    Lab Results: CBC Recent Labs    04/30/21 0607 05/01/21 0416  WBC 13.7* 13.3*  HGB 13.1 10.5*  HCT 36.6 29.0*  PLT 356 330    BMET Recent Labs    04/30/21 0607 05/01/21 0416  NA 111* 109*  K 4.1 3.4*  CL 76* 83*  CO2 24 22  GLUCOSE 159* 90  BUN 9 6*  CREATININE 0.62  0.59  CALCIUM 8.4* 7.6*    MICRO Recent Results (from the past 240 hour(s))  Resp Panel by RT-PCR (Flu A&B, Covid) Nasopharyngeal Swab     Status: Abnormal   Collection Time: 04/28/21  9:55 AM   Specimen: Nasopharyngeal Swab; Nasopharyngeal(NP) swabs in vial transport medium  Result Value Ref Range Status   SARS Coronavirus 2 by RT PCR POSITIVE (A)  NEGATIVE Final    Comment: (NOTE) SARS-CoV-2 target nucleic acids are DETECTED.  The SARS-CoV-2 RNA is generally detectable in upper respiratory specimens during the acute phase of infection. Positive results are indicative of the presence of the identified virus, but do not rule out bacterial infection or co-infection with other pathogens not detected by the test. Clinical correlation with patient history and other diagnostic information is necessary to determine patient infection status. The expected result is Negative.  Fact Sheet for Patients: BloggerCourse.com  Fact Sheet for Healthcare Providers: SeriousBroker.it  This test is not yet approved or cleared by the [place] FDA and  has been authorized for detection and/or diagnosis of SARS-CoV-2 by FDA under an Emergency Use Authorization (EUA).  This EUA will remain in effect (meaning this test can be used) for the duration of  the COVID-19 declaration under Section 564(b)(1) of the A ct, 21 U.S.C. section 360bbb-3(b)(1), unless the authorization is terminated or revoked sooner.     Influenza A by PCR NEGATIVE NEGATIVE Final   Influenza B by PCR NEGATIVE NEGATIVE Final    Comment: (NOTE) The Xpert Xpress SARS-CoV-2/FLU/RSV plus assay is intended as an aid in the diagnosis of influenza from Nasopharyngeal swab specimens and should not be used as a sole basis for treatment. Nasal washings and aspirates are unacceptable for Xpert Xpress SARS-CoV-2/FLU/RSV testing.  Fact Sheet for  Patients: BloggerCourse.com  Fact Sheet for Healthcare Providers: SeriousBroker.it  This test is not yet approved or cleared by the [place] FDA and has been authorized for detection and/or diagnosis of SARS-CoV-2 by FDA under an Emergency Use Authorization (EUA). This EUA will remain in effect (meaning this test can be used) for the duration of the COVID-19 declaration under Section 564(b)(1) of the Act, 21 U.S.C. section 360bbb-3(b)(1), unless the authorization is terminated or revoked.  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 -Patient is euvolemic -Patient has normal creatinine Patient's sodium has decreased with IV fluids We will ask for further studies including uric acid levels  I reviewed the studies  Latest Reference Range & Units 05/01/21 09:31  Osmolality, Urine 300 - 900 mOsm/kg 345  Sodium, Urine mmol/L 38  Creatinine, Urine mg/dL 78    Latest Reference Range & Units 05/01/21 16:10  Osmolality 275 - 295 mOsm/kg 240 (LL)  (LL): Data is critically low  Patient urine osmolality is high Pt Serum osmolarity is low Patient is euvolemic  We will ask for Bmet every 4 hours   2)HTN Blood pressure is stable   3)Anemia of chronic disease HGb at goal (9--11)   4)COVID-19 Patient is on remdesivir Patient being followed with the primary team   5)Hypokalemia Now  better   6)Hypomagnesemia Being replete   7)Acid base Co2 at goal     Plan:  We will hold IV fluids We will start patient on salt tablets plus Lasix combination We will ask for urine osmolality, urine sodium and urine creatinine We will ask for serum cortisol we will follow  Chem-7  Magnesium being repleted  Potassium better We will start patient on KCl as I am starting patient on Lasix patient was a little hypokalemic   Jashon Ishida s [facility] 05/01/2021, 8:44 AM

## 2021-05-02 LAB — CBC WITH DIFFERENTIAL/PLATELET
Abs Immature Granulocytes: 1.4 10*3/uL — ABNORMAL HIGH (ref 0.00–0.07)
Basophils Absolute: 0 10*3/uL (ref 0.0–0.1)
Basophils Relative: 0 %
Eosinophils Absolute: 0.2 10*3/uL (ref 0.0–0.5)
Eosinophils Relative: 2 %
HCT: 32.6 % — ABNORMAL LOW (ref 36.0–46.0)
Hemoglobin: 11.7 g/dL — ABNORMAL LOW (ref 12.0–15.0)
Immature Granulocytes: 11 %
Lymphocytes Relative: 12 %
Lymphs Abs: 1.6 10*3/uL (ref 0.7–4.0)
MCH: 29.1 pg (ref 26.0–34.0)
MCHC: 35.9 g/dL (ref 30.0–36.0)
MCV: 81.1 fL (ref 80.0–100.0)
Monocytes Absolute: 1.5 10*3/uL — ABNORMAL HIGH (ref 0.1–1.0)
Monocytes Relative: 12 %
Neutro Abs: 8.3 10*3/uL — ABNORMAL HIGH (ref 1.7–7.7)
Neutrophils Relative %: 63 %
Platelets: 375 10*3/uL (ref 150–400)
RBC: 4.02 MIL/uL (ref 3.87–5.11)
RDW: 11.3 % — ABNORMAL LOW (ref 11.5–15.5)
Smear Review: NORMAL
WBC: 13 10*3/uL — ABNORMAL HIGH (ref 4.0–10.5)
nRBC: 0 % (ref 0.0–0.2)

## 2021-05-02 LAB — COMPREHENSIVE METABOLIC PANEL
ALT: 19 U/L (ref 0–44)
AST: 26 U/L (ref 15–41)
Albumin: 3 g/dL — ABNORMAL LOW (ref 3.5–5.0)
Alkaline Phosphatase: 34 U/L — ABNORMAL LOW (ref 38–126)
Anion gap: 7 (ref 5–15)
BUN: 8 mg/dL (ref 8–23)
CO2: 26 mmol/L (ref 22–32)
Calcium: 8 mg/dL — ABNORMAL LOW (ref 8.9–10.3)
Chloride: 89 mmol/L — ABNORMAL LOW (ref 98–111)
Creatinine, Ser: 0.79 mg/dL (ref 0.44–1.00)
GFR, Estimated: >60 mL/min (ref >60)
Glucose, Bld: 89 mg/dL (ref 70–99)
Potassium: 3.4 mmol/L — ABNORMAL LOW (ref 3.5–5.1)
Sodium: 122 mmol/L — ABNORMAL LOW (ref 135–145)
Total Bilirubin: 0.6 mg/dL (ref 0.3–1.2)
Total Protein: 5.6 g/dL — ABNORMAL LOW (ref 6.5–8.1)

## 2021-05-02 LAB — MAGNESIUM: Magnesium: 2.2 mg/dL (ref 1.7–2.4)

## 2021-05-02 LAB — OSMOLALITY, URINE: Osmolality, Ur: 165 mOsm/kg — ABNORMAL LOW (ref 300–900)

## 2021-05-02 LAB — BASIC METABOLIC PANEL
Anion gap: 6 (ref 5–15)
Anion gap: 6 (ref 5–15)
Anion gap: 7 (ref 5–15)
Anion gap: 9 (ref 5–15)
Anion gap: 9 (ref 5–15)
BUN: 10 mg/dL (ref 8–23)
BUN: 7 mg/dL — ABNORMAL LOW (ref 8–23)
BUN: 8 mg/dL (ref 8–23)
BUN: 8 mg/dL (ref 8–23)
BUN: 9 mg/dL (ref 8–23)
CO2: 25 mmol/L (ref 22–32)
CO2: 25 mmol/L (ref 22–32)
CO2: 26 mmol/L (ref 22–32)
CO2: 27 mmol/L (ref 22–32)
CO2: 28 mmol/L (ref 22–32)
Calcium: 7.7 mg/dL — ABNORMAL LOW (ref 8.9–10.3)
Calcium: 7.9 mg/dL — ABNORMAL LOW (ref 8.9–10.3)
Calcium: 7.9 mg/dL — ABNORMAL LOW (ref 8.9–10.3)
Calcium: 8 mg/dL — ABNORMAL LOW (ref 8.9–10.3)
Calcium: 8.2 mg/dL — ABNORMAL LOW (ref 8.9–10.3)
Chloride: 88 mmol/L — ABNORMAL LOW (ref 98–111)
Chloride: 89 mmol/L — ABNORMAL LOW (ref 98–111)
Chloride: 90 mmol/L — ABNORMAL LOW (ref 98–111)
Chloride: 90 mmol/L — ABNORMAL LOW (ref 98–111)
Chloride: 93 mmol/L — ABNORMAL LOW (ref 98–111)
Creatinine, Ser: 0.79 mg/dL (ref 0.44–1.00)
Creatinine, Ser: 0.82 mg/dL (ref 0.44–1.00)
Creatinine, Ser: 0.82 mg/dL (ref 0.44–1.00)
Creatinine, Ser: 0.92 mg/dL (ref 0.44–1.00)
Creatinine, Ser: 1 mg/dL (ref 0.44–1.00)
GFR, Estimated: 59 mL/min — ABNORMAL LOW (ref >60)
GFR, Estimated: >60 mL/min (ref >60)
GFR, Estimated: >60 mL/min (ref >60)
GFR, Estimated: >60 mL/min (ref >60)
GFR, Estimated: >60 mL/min (ref >60)
Glucose, Bld: 117 mg/dL — ABNORMAL HIGH (ref 70–99)
Glucose, Bld: 129 mg/dL — ABNORMAL HIGH (ref 70–99)
Glucose, Bld: 155 mg/dL — ABNORMAL HIGH (ref 70–99)
Glucose, Bld: 94 mg/dL (ref 70–99)
Glucose, Bld: 97 mg/dL (ref 70–99)
Potassium: 3.2 mmol/L — ABNORMAL LOW (ref 3.5–5.1)
Potassium: 3.2 mmol/L — ABNORMAL LOW (ref 3.5–5.1)
Potassium: 3.3 mmol/L — ABNORMAL LOW (ref 3.5–5.1)
Potassium: 3.3 mmol/L — ABNORMAL LOW (ref 3.5–5.1)
Potassium: 3.4 mmol/L — ABNORMAL LOW (ref 3.5–5.1)
Sodium: 121 mmol/L — ABNORMAL LOW (ref 135–145)
Sodium: 121 mmol/L — ABNORMAL LOW (ref 135–145)
Sodium: 124 mmol/L — ABNORMAL LOW (ref 135–145)
Sodium: 125 mmol/L — ABNORMAL LOW (ref 135–145)
Sodium: 127 mmol/L — ABNORMAL LOW (ref 135–145)

## 2021-05-02 LAB — PHOSPHORUS: Phosphorus: 2.2 mg/dL — ABNORMAL LOW (ref 2.5–4.6)

## 2021-05-02 LAB — OSMOLALITY: Osmolality: 252 mOsm/kg — ABNORMAL LOW (ref 275–295)

## 2021-05-02 MED ORDER — FUROSEMIDE 20 MG PO TABS
20.0000 mg | ORAL_TABLET | Freq: Every day | ORAL | Status: DC
  Administered 2021-05-03: 20 mg via ORAL
  Filled 2021-05-02: qty 1

## 2021-05-02 MED ORDER — K PHOS MONO-SOD PHOS DI & MONO 155-852-130 MG PO TABS
500.0000 mg | ORAL_TABLET | Freq: Two times a day (BID) | ORAL | Status: AC
  Administered 2021-05-02 (×2): 500 mg via ORAL
  Filled 2021-05-02 (×2): qty 2

## 2021-05-02 MED ORDER — MELATONIN 5 MG PO TABS
2.5000 mg | ORAL_TABLET | Freq: Every evening | ORAL | Status: DC | PRN
  Administered 2021-05-02: 2.5 mg via ORAL
  Filled 2021-05-02: qty 1

## 2021-05-02 NOTE — Plan of Care (Signed)
?  Problem: Education: ?Goal: Knowledge of General Education information will improve ?Description: Including pain rating scale, medication(s)/side effects and non-pharmacologic comfort measures ?Outcome: Progressing ?  ?Problem: Health Behavior/Discharge Planning: ?Goal: Ability to manage health-related needs will improve ?Outcome: Progressing ?  ?Problem: Clinical Measurements: ?Goal: Ability to maintain clinical measurements within normal limits will improve ?Outcome: Progressing ?Goal: Will remain free from infection ?Outcome: Progressing ?Goal: Diagnostic test results will improve ?Outcome: Progressing ?Goal: Respiratory complications will improve ?Outcome: Progressing ?Goal: Cardiovascular complication will be avoided ?Outcome: Progressing ?  ?Problem: Activity: ?Goal: Risk for activity intolerance will decrease ?Outcome: Progressing ?  ?Problem: Nutrition: ?Goal: Adequate nutrition will be maintained ?Outcome: Progressing ?  ?Problem: Elimination: ?Goal: Will not experience complications related to bowel motility ?Outcome: Progressing ?Goal: Will not experience complications related to urinary retention ?Outcome: Progressing ?  ?Problem: Pain Managment: ?Goal: General experience of comfort will improve ?Outcome: Progressing ?  ?Problem: Safety: ?Goal: Ability to remain free from injury will improve ?Outcome: Progressing ?  ?Problem: Skin Integrity: ?Goal: Risk for impaired skin integrity will decrease ?Outcome: Progressing ?  ?Problem: Coping: ?Goal: Level of anxiety will decrease ?Outcome: Completed/Met ?  ?

## 2021-05-02 NOTE — Progress Notes (Signed)
?  Progress Note ? ? ?Patient: Wendy [name] KDT:[id number] DOB: [birth year] DOA: 04/30/2021     2 ?DOS: the patient was seen and examined on 05/02/2021 ?  ?Brief hospital course: ?Taken from H&P. ? ?LELIANA [name] is a 76 y.o. female with medical history significant for hypertension, dyslipidemia, prior history of hyponatremia, who presented to [facility] ED with complaints of generalized weakness, persistent nausea and vomiting as well as inability to tolerate any oral intake.  Tested positive for COVID-19 virus on 04/23/2021.  Completed a course of Paxlovid outpatient.  Work-up revealed severe hypovolemic hyponatremia, COVID-19 screening test positive on 04/28/2021.  ? ?Hyponatremia labs with low serum osmolality, high urine osmolality and high urinary sodium.  Sodium decreased with initial IV fluid.  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  Nephrology was consulted and patient was placed on fluid restricted diet with some improvement in sodium. ? ?3/6: Sodium improving with fluid restriction, completed the quarantine time. ?She has already completed outpatient Paxlovid, no need for remdesivir-discontinued today ? ? 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  Nephrology is on board. ?Sodium now improving with fluid restriction. ?-Continue with Lasix ?-Monitor sodium. ?-Restrict fluid. ?-Goal increase of 8-10 in 24-hour, currently at 125. ? ?COVID-19 virus infection ?Patient initial positive COVID-19 PCR test was on 04/23/21 ?She is vaccinated and received 1 booster dose ?She completed a course of Paxlovid but remains symptomatic ?Her COVID-19 cycle threshold is 29.6 ?Patient was placed on remdesivir-already almost 10 days out, ?-Discontinue remdesivir. ?-Continue with supportive care ?-Continue with supplements ? ?Weakness ?Multifactorial with hyponatremia and recent COVID infection. ?-PT/OT is recommending home health ? ?HTN  (hypertension) ?Blood pressure within goal ?-Continue metoprolol and losartan ? ? ?GERD ?- Continue with Protonix ? ? ?Subjective: Patient was seen and examined today.  No new complaints.  Continues to have some hoarse voice.  Cough improving. ? ?Physical Exam: ?Vitals:  ? 05/01/21 0837 05/01/21 1708 05/02/21 0603 05/02/21 0830  ?BP: 128/64 130/70 139/73 125/68  ?Pulse: 65 65 65 62  ?Resp: '16 16  16  '$ ?Temp: 97.7 ?F (36.5 ?C) 98 ?F (36.7 ?C)  97.9 ?F (36.6 ?C)  ?TempSrc:      ?SpO2: 98% 99% 98% 97%  ?Weight:      ?Height:      ? ?General.     In no acute distress. ?Pulmonary.  Lungs clear bilaterally, normal respiratory effort. ?CV.  Regular rate and rhythm, no JVD, rub or murmur. ?Abdomen.  Soft, nontender, nondistended, BS positive. ?CNS.  Alert and oriented x3.  No focal neurologic deficit. ?Extremities.  No edema, no cyanosis, pulses intact and symmetrical. ?Psychiatry.  Judgment and insight appears normal. ? ?Data Reviewed: ?Prior notes, labs and images reviewed. ? ?Family Communication:  ? ?Disposition: ?Status is: Inpatient ?Remains inpatient appropriate because: Severity of illness ? ? Planned Discharge Destination: Home with Home Health ? ?DVT prophylaxis.  Lovenox ? ?Time spent: 45 minutes ? ?This record has been created using Systems analyst. Errors have been sought and corrected,but may not always be located. Such creation errors do not reflect on the standard of care. ? ?Author: ?Lorella Nimrod, MD ?05/02/2021 5:16 PM ? ?For on call review [email or web address].  ?

## 2021-05-02 NOTE — Hospital Course (Addendum)
Taken from H&P. ? ?Wendy [name] is a 76 y.o. female with medical history significant for hypertension, dyslipidemia, prior history of hyponatremia, who presented to [facility] ED with complaints of generalized weakness, persistent nausea and vomiting as well as inability to tolerate any oral intake.  Tested positive for COVID-19 virus on 04/23/2021.  Completed a course of Paxlovid outpatient.  Work-up revealed severe hypovolemic hyponatremia, COVID-19 screening test positive on 04/28/2021.  ? ?Hyponatremia labs with low serum osmolality, high urine osmolality and high urinary sodium.  Sodium decreased with initial IV fluid.  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  Nephrology was consulted and patient was placed on fluid restricted diet with some improvement in sodium. ? ?3/6: Sodium improving with fluid restriction, completed the quarantine time. ?She has already completed outpatient Paxlovid, no need for remdesivir-discontinued today. ? ?Her sodium continued to improve, it was 129 before discharge.  She was given a prescription for salt tablets, she can also use as added salt in her food if those tablets are very expensive. ?She needs to restrict her fluid to 1200 cc. ?She will also need a repeat BMP on Friday. ? ?Patient did received Lasix while in the hospital, we did not continue as he was on spironolactone at home.  Appears euvolemic.  She can continue her home medications and have a close follow-up with primary care provider for further recommendations. ?

## 2021-05-02 NOTE — Evaluation (Signed)
Physical Therapy Evaluation ?Patient Details ?Name: Wendy [name] ?MRN: [id number] ?DOB: [birth year] ?Today's Date: 05/02/2021 ? ?History of Present Illness ? Pt admitted to [facility] on 04/30/21 for c/o COVID-like symptoms, after testing positive for COVID last Saturday. Pt completed course of Paxlovid without improvements in symptoms. She was seen at the [facility] ER 2 days prior to this hospitalization for worsening upper respiratory illness and was discharged home. Found to have hyponatremia at 111. Significant PMH includes: HTN, HLD, IBS, anxiety. ?  ?Clinical Impression ? Pt admitted with above diagnosis. Pt received supine in bed agreeable to PT services. Reports PLOF, DME, home lay out, and assist that can be provided at home. At baseline pt is fully indep with all mobility needs, ADL's/IADL's. Pt remains mod-I with bed mobility and performing transfers with supervision requiring min VC's for hand placement. Pt tolerating completion of stair training and gait of ~400' with RW both with supervision. Step through pattern and single rail use for 4 stairs x2 trials with CGA. Attempts made to ambulate in room without AD. Pt does display balance deficits ambulating in room with frequent need to utilize UE's on objects in room due to minor sway and unsteadiness requiring CGA from PT for safety. Pt in recliner upon exit with RN in room, all needs in reach.  Plan to keep pt on caseload to assist in dynamic balance during acute stay. Plan to rec [facility] PT upon discharge to improve balance deficits to return to PLOF in home environment. Pt currently with functional limitations due to the deficits listed below (see PT Problem List). Pt will benefit from skilled PT to increase their independence and safety with mobility to allow discharge to the venue listed below.     ? ?Recommendations for follow up therapy are one component of a multi-disciplinary discharge planning process, led by the attending physician.  Recommendations may be  updated based on patient status, additional functional criteria and insurance authorization. ? ?Follow Up Recommendations Home health PT ? ?  ?Assistance Recommended at Discharge PRN  ?Patient can return home with the following ? Assist for transportation;A little help with walking and/or transfers;Assistance with cooking/housework ? ?  ?Equipment Recommendations None recommended by PT  ?Recommendations for Other Services ?    ?  ?Functional Status Assessment Patient has had a recent decline in their functional status and demonstrates the ability to make significant improvements in function in a reasonable and predictable amount of time.  ? ?  ?Precautions / Restrictions Precautions ?Precautions: Fall ?Restrictions ?Weight Bearing Restrictions: No  ? ?  ? ?Mobility ? Bed Mobility ?Overal bed mobility: Modified Independent ?  ?  ?  ?  ?  ?  ?  ?Patient Response: Cooperative ? ?Transfers ?  ?Equipment used: Rolling walker (2 wheels) ?  ?  ?  ?  ?  ?  ?  ?General transfer comment: VC's for hand placement ?  ? ?Ambulation/Gait ?Ambulation/Gait assistance: Supervision ?Gait Distance (Feet): 400 Feet ?Assistive device: Rolling walker (2 wheels) ?Gait Pattern/deviations: Step-through pattern ?  ?  ?  ?General Gait Details: Amb within room ~30' with no AD. Pt requires UE assist in items in room for stability. Does have some general unsteadiness without AD. ? ?Stairs ?  ?  ?  ?  ?  ? ?Wheelchair Mobility ?  ? ?Modified Rankin (Stroke Patients Only) ?  ? ?  ? ?Balance Overall balance assessment: Needs assistance ?Sitting-balance support: No upper extremity supported, Feet supported ?Sitting balance-Leahy Scale: Good ?  ?  ?  Standing balance support: No upper extremity supported ?Standing balance-Leahy Scale: Fair ?Standing balance comment: Can maintain static standing without AD. ?  ?  ?  ?  ?  ?  ?  ?  ?  ?  ?  ?   ? ? ? ?Pertinent Vitals/Pain Pain Assessment ?Pain Assessment: No/denies pain  ? ? ?Home Living Family/patient  expects to be discharged to:: Private residence ?Living Arrangements: Spouse/significant other ?Available Help at Discharge: Family ?Type of Home: House ?Home Access: Stairs to enter ?  ?Entrance Stairs-Number of Steps: 6-8 from basement with R railing. 3-4 from front door with B rails (can reach both). ?  ?Home Layout: Multi-level;Full bath on main level;Able to live on main level with bedroom/bathroom ?Home Equipment: Conservation officer, nature (2 wheels) ?Additional Comments: Has other equipment such as "canes" at home but can't remember what kind as she does not use them.  ?  ?Prior Function Prior Level of Function : Independent/Modified Independent ?  ?  ?  ?  ?  ?  ?  ?  ?  ? ? ?Hand Dominance  ?   ? ?  ?Extremity/Trunk Assessment  ? Upper Extremity Assessment ?Upper Extremity Assessment: Overall WFL for tasks assessed ?  ? ?Lower Extremity Assessment ?Lower Extremity Assessment: Generalized weakness ?  ? ?Cervical / Trunk Assessment ?Cervical / Trunk Assessment: Normal  ?Communication  ? Communication: No difficulties  ?Cognition Arousal/Alertness: Awake/alert ?Behavior During Therapy: [facility] for tasks assessed/performed ?Overall Cognitive Status: Within Functional Limits for tasks assessed ?  ?  ?  ?  ?  ?  ?  ?  ?  ?  ?  ?  ?  ?  ?  ?  ?General Comments: Polite, cooperative and following all commands. ?  ?  ? ?  ?General Comments General comments (skin integrity, edema, etc.): Irregular heart rhythm. HR fluctating from 95 BPM to 111 BPM. ? ?  ?Exercises General Exercises - Lower Extremity ?Long Arc Quad: AROM, Strengthening, Both, 20 reps, Seated ?Other Exercises ?Other Exercises: Role of PT in acute setting, use of hands for safe tranfsers to <> from AD, D/c recs, stair training.  ? ?Assessment/Plan  ?  ?PT Assessment Patient needs continued PT services  ?PT Problem List Decreased strength;Decreased knowledge of use of DME;Decreased balance ? ?   ?  ?PT Treatment Interventions DME instruction;Therapeutic exercise;Gait  training;Balance training;Stair training;Neuromuscular re-education;Functional mobility training;Therapeutic activities;Patient/family education   ? ?PT Goals (Current goals can be found in the Care Plan section)  ?Acute Rehab PT Goals ?Patient Stated Goal: improve balance and strength ?PT Goal Formulation: With patient/family ?Time For Goal Achievement: 05/16/21 ?Potential to Achieve Goals: Good ? ?  ?Frequency Min 2X/week ?  ? ? ?Co-evaluation   ?  ?  ?  ?  ? ? ?  ?AM-PAC PT "6 Clicks" Mobility  ?Outcome Measure Help needed turning from your back to your side while in a flat bed without using bedrails?: None ?Help needed moving from lying on your back to sitting on the side of a flat bed without using bedrails?: None ?Help needed moving to and from a bed to a chair (including a wheelchair)?: A Little ?Help needed standing up from a chair using your arms (e.g., wheelchair or bedside chair)?: A Little ?Help needed to walk in hospital room?: A Little ?Help needed climbing 3-5 steps with a railing? : A Little ?6 Click Score: 20 ? ?  ?End of Session Equipment Utilized During Treatment: Gait belt ?Activity Tolerance: Patient tolerated  treatment well ?Patient left: in chair;with family/visitor present ?Nurse Communication: Mobility status ?PT Visit Diagnosis: Unsteadiness on feet (R26.81);Muscle weakness (generalized) (M62.81) ?  ? ?Time: [phone] ?PT Time Calculation (min) (ACUTE ONLY): 36 min ? ? ?Charges:   PT Evaluation ?$PT Eval Moderate Complexity: 1 Mod ?PT Treatments ?$Gait Training: 23-37 mins ?  ?   ? ?[place] Caster. Fairly IV, PT, DPT ?Physical Therapist- [place]  ?[facility]  ?05/02/2021, 9:55 AM ? ?

## 2021-05-02 NOTE — TOC Initial Note (Signed)
Transition of Care (TOC) - Initial/Assessment Note  ? ? ?Patient Details  ?Name: Wendy [name] ?MRN: [id number] ?Date of Birth: October 21, 1945 ? ?Transition of Care (TOC) CM/SW Contact:    ?Hallie Ishida A Lorriane Dehart, LCSW ?Phone Number: ?05/02/2021, 10:23 AM ? ?Clinical Narrative:     CSW spoke with pt and pt lives with spouse. Pt is agreeable to [facility] with no agency preference. Pt states she has a walker and has no DME needs.  ? ?Referral given to [facility] with [facility].            ? ? ?Expected Discharge Plan: [place] ?Barriers to Discharge: Continued Medical Work up ? ? ?Patient Goals and CMS Choice ?Patient states their goals for this hospitalization and ongoing recovery are:: to go home ?  ?Choice offered to / list presented to : Patient ? ?Expected Discharge Plan and Services ?Expected Discharge Plan: [place] ?  ?  ?Post Acute Care Choice: Home Health ?Living arrangements for the past 2 months: [place] ?                ?  ?  ?  ?  ?  ?HH Arranged: PT ?[place] Agency: [place] ([place]) ?Date HH Agency Contacted: 05/02/21 ?Time [place]: 1023 ?Representative spoke with at [place]: Corene Cornea ? ?Prior Living Arrangements/Services ?Living arrangements for the past 2 months: [place] ?Lives with:: Spouse ?Patient language and need for interpreter reviewed:: Yes ?Do you feel safe going back to the place where you live?: Yes      ?Need for Family Participation in Patient Care: Yes (Comment) ?Care giver support system in place?: Yes (comment) ?  ?Criminal Activity/Legal Involvement Pertinent to Current Situation/Hospitalization: No - Comment as needed ? ?Activities of Daily Living ?Home Assistive Devices/Equipment: None ?ADL Screening (condition at time of admission) ?Patient's cognitive ability adequate to safely complete daily activities?: Yes ?Is the patient deaf or have difficulty hearing?: No ?Does the patient have difficulty seeing, even when wearing  glasses/contacts?: No ?Does the patient have difficulty concentrating, remembering, or making decisions?: No ?Patient able to express need for assistance with ADLs?: Yes ?Does the patient have difficulty dressing or bathing?: No ?Independently performs ADLs?: Yes (appropriate for developmental age) ?Does the patient have difficulty walking or climbing stairs?: No ?Weakness of Legs: Both ?Weakness of Arms/Hands: None ? ?Permission Sought/Granted ?Permission sought to share information with : Family Supports ?Permission granted to share information with : Yes, Verbal Permission Granted ? Share Information with NAME: Christia Reading ?   ? Permission granted to share info w Relationship: spouse ?   ? ?Emotional Assessment ?Appearance:: Appears stated age ?Attitude/Demeanor/Rapport: Engaged ?Affect (typically observed): Accepting ?Orientation: : Oriented to Self, Oriented to Place, Oriented to  Time, Oriented to Situation ?Alcohol / Substance Use: Not Applicable ?Psych Involvement: No (comment) ? ?Admission diagnosis:  Hyponatremia [E87.1] ?Nausea and vomiting in adult [R11.2] ?COVID-19 [U07.1] ?Patient Active Problem List  ? Diagnosis Date Noted  ? Hyponatremia 04/30/2021  ? Weakness 04/30/2021  ? COVID-19 virus infection 04/30/2021  ? Mixed hyperlipidemia 03/30/2016  ? Carotid artery disease ([place]) 02/15/2015  ? Occlusion and stenosis of carotid artery without mention of cerebral infarction 09/02/2013  ? OBSTRUCTIVE SLEEP APNEA 11/19/2009  ? SNORING 10/11/2009  ? LACERATION OF FINGER 09/03/2009  ? DYSESTHESIA 05/23/2008  ? MALIGNANT MELANOMA 01/13/2008  ? ANXIETY 01/13/2008  ? VITREOUS HEMORRHAGE 01/13/2008  ? HTN (hypertension) 01/13/2008  ? DIVERTICULOSIS OF COLON 01/13/2008  ?  IRRITABLE BOWEL SYNDROME 01/13/2008  ? 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 CHEST PAIN, ATYPICAL 01/13/2008  ? HYPERCHOLESTEROLEMIA 01/10/2008  ? GERD 01/10/2008  ? ?PCP:  Reynold Bowen, MD ?Pharmacy:   ?[place],  [place] ?[place] ?[place] [address] ?Phone: [phone] Fax: [phone] ? ?[facility] [place], [place] - [address], [address] ?[address], [address] ?[place] [address] ?Phone: [phone] Fax: [phone] ? ?[id number] [place], [place] - [address] [address]?Phone: [phone]: [phone]? ?[id number] [place],  - [address]Phone: [phone]: [phone] ?[address]: [phone]: [phone] ? ? ? ?Social Determinants of Health (SDOH) Interventions ?  ? ?Readmission Risk Interventions ?No flowsheet data found. ? ? ?

## 2021-05-02 NOTE — Progress Notes (Signed)
?[facility]  ?ROUNDING NOTE  ? ?Subjective:  ? ?Patient seen sitting at side of bed, husband at bedside ?Alert and oriented ?Tolerating meals without nausea and vomiting ?Ambulating in hallway without difficulty ? ?Sodium 125 ? ?Objective:  ?Vital signs in last 24 hours:  ?Temp:  [97.9 ?F (36.6 ?C)-98 ?F (36.7 ?C)] 97.9 ?F (36.6 ?C) (03/06 0830) ?Pulse Rate:  [62-65] 62 (03/06 0830) ?Resp:  [16] 16 (03/06 0830) ?BP: (125-139)/(68-73) 125/68 (03/06 0830) ?SpO2:  [97 %-99 %] 97 % (03/06 0830) ? ?Weight change:  ?Filed Weights  ? 04/30/21 0548  ?Weight: 63.5 kg  ? ? ?Intake/Output: ?I/O last 3 completed shifts: ?In: 1723.2 [P.O.:720; I.V.:853.2; IV Piggyback:150] ?Out: -  ?  ?Intake/Output this shift: ? Total I/O ?In: 360 [P.O.:360] ?Out: -  ? ?Physical Exam: ?General: NAD, sitting at side of bed  ?Head: Normocephalic, atraumatic. Moist oral mucosal membranes  ?Eyes: Anicteric  ?Lungs:  Clear to auscultation, normal effort  ?Heart: Regular rate and rhythm  ?Abdomen:  Soft, nontender   ?Extremities: No peripheral edema.  ?Neurologic: Nonfocal, moving all four extremities  ?Skin: No lesions  ?   ? ? ?Basic Metabolic Panel: ?Recent Labs  ?Lab 04/30/21 ?0607 05/01/21 ?0416 05/01/21 ?2042 05/02/21 ?0008 05/02/21 ?0421 05/02/21 ?3382 05/02/21 ?1221  ?NA 111*   < > 120* 121* 122* 121* 125*  ?K 4.1   < > 3.4* 3.3* 3.4* 3.4* 3.2*  ?CL 76*   < > 88* 89* 89* 90* 90*  ?CO2 24   < > '24 26 26 25 28  '$ ?GLUCOSE 159*   < > 111* 97 89 94 117*  ?BUN 9   < > '8 8 8 8 '$ 7*  ?CREATININE 0.62   < > 0.73 0.79 0.79 0.82 0.82  ?CALCIUM 8.4*   < > 8.0* 8.0* 8.0* 7.9* 7.7*  ?MG 1.6*  --   --   --  2.2  --   --   ?PHOS  --   --   --   --  2.2*  --   --   ? < > = values in this interval not displayed.  ? ? ?Liver Function Tests: ?Recent Labs  ?Lab 04/30/21 ?0607 05/01/21 ?0416 05/02/21 ?0421  ?AST '28 26 26  '$ ?ALT '17 16 19  '$ ?ALKPHOS 42 30* 34*  ?BILITOT 1.3* 0.9 0.6  ?PROT 6.2* 5.2* 5.6*  ?ALBUMIN 3.5 2.8* 3.0*  ? ?Recent Labs  ?Lab  04/30/21 ?0607  ?LIPASE 34  ? ?No results for input(s): AMMONIA in the last 168 hours. ? ?CBC: ?Recent Labs  ?Lab 04/30/21 ?0607 05/01/21 ?0416 05/02/21 ?0421  ?WBC 13.7* 13.3* 13.0*  ?NEUTROABS 10.8* 9.1* 8.3*  ?HGB 13.1 10.5* 11.7*  ?HCT 36.6 29.0* 32.6*  ?MCV 79.9* 79.9* 81.1  ?PLT 356 330 375  ? ? ?Cardiac Enzymes: ?No results for input(s): CKTOTAL, CKMB, CKMBINDEX, TROPONINI in the last 168 hours. ? ?BNP: ?Invalid input(s): POCBNP ? ?CBG: ?No results for input(s): GLUCAP in the last 168 hours. ? ?Microbiology: ?Results for orders placed or performed during the hospital encounter of 04/28/21  ?Resp Panel by RT-PCR (Flu A&B, Covid) Nasopharyngeal Swab     Status: Abnormal  ? Collection Time: 04/28/21  9:55 AM  ? Specimen: Nasopharyngeal Swab; Nasopharyngeal(NP) swabs in vial transport medium  ?Result Value Ref Range Status  ? SARS Coronavirus 2 by RT PCR POSITIVE (A) NEGATIVE Final  ?  Comment: (NOTE) ?SARS-CoV-2 target nucleic acids are DETECTED. ? ?The SARS-CoV-2 RNA is generally detectable in  upper respiratory ?specimens during the acute phase of infection. Positive results are ?indicative of the presence of the identified virus, but do not rule ?out bacterial infection or co-infection with other pathogens not ?detected by the test. Clinical correlation with patient history and ?other diagnostic information is necessary to determine patient ?infection status. The expected result is Negative. ? ?Fact Sheet for Patients: ?EntrepreneurPulse.com.au ? ?Fact Sheet for Healthcare Providers: ?IncredibleEmployment.be ? ?This test is not yet approved or cleared by the [place] FDA and  ?has been authorized for detection and/or diagnosis of SARS-CoV-2 by ?FDA under an Emergency Use Authorization (EUA).  This EUA will ?remain in effect (meaning this test can be used) for the duration of  ?the COVID-19 declaration under Section 564(b)(1) of the A ct, 21 ?U.S.C. section  360bbb-3(b)(1), unless the authorization is ?terminated or revoked sooner. ? ?  ? Influenza A by PCR NEGATIVE NEGATIVE Final  ? Influenza B by PCR NEGATIVE NEGATIVE Final  ?  Comment: (NOTE) ?The Xpert Xpress SARS-CoV-2/FLU/RSV plus assay is intended as an aid ?in the diagnosis of influenza from Nasopharyngeal swab specimens and ?should not be used as a sole basis for treatment. Nasal washings and ?aspirates are unacceptable for Xpert Xpress SARS-CoV-2/FLU/RSV ?testing. ? ?Fact Sheet for Patients: ?EntrepreneurPulse.com.au ? ?Fact Sheet for Healthcare Providers: ?IncredibleEmployment.be ? ?This test is not yet approved or cleared by the [place] FDA and ?has been authorized for detection and/or diagnosis of SARS-CoV-2 by ?FDA under an Emergency Use Authorization (EUA). This EUA will remain ?in effect (meaning this test can be used) for the duration of the ?COVID-19 declaration under Section 564(b)(1) of the Act, 21 U.S.C. ?section 360bbb-3(b)(1), unless the authorization is terminated or ?revoked. ? ?Performed at [facility], [address], ?[place], [place] [address] ?  ? ? ?Coagulation Studies: ?No results for input(s): LABPROT, INR in the last 72 hours. ? ?Urinalysis: ?Recent Labs  ?  04/30/21 ?1304  ?COLORURINE YELLOW*  ?LABSPEC 1.020  ?PHURINE 5.0  ?GLUCOSEU 50*  ?HGBUR NEGATIVE  ?BILIRUBINUR NEGATIVE  ?KETONESUR 5*  ?PROTEINUR NEGATIVE  ?NITRITE NEGATIVE  ?LEUKOCYTESUR TRACE*  ?  ? ? ?Imaging: ?No results found. ? ? ?Medications:  ? ? ? albuterol  2 puff Inhalation Q6H  ? vitamin C  500 mg Oral Daily  ? aspirin EC  81 mg Oral Daily  ? benzonatate  200 mg Oral TID  ? cholecalciferol  2,000 Units Oral Daily  ? enoxaparin (LOVENOX) injection  40 mg Subcutaneous Q24H  ? furosemide  20 mg Oral BID  ? metoprolol succinate  12.5 mg Oral Daily  ? multivitamin with minerals  1 tablet Oral Daily  ? omega-3 acid ethyl esters  1 g Oral Daily  ? pantoprazole  40  mg Oral Daily  ? phosphorus  500 mg Oral BID  ? sodium chloride  2 g Oral BID WC  ? zinc sulfate  220 mg Oral Daily  ? ?acetaminophen, fluticasone, guaiFENesin-dextromethorphan, ondansetron **OR** ondansetron (ZOFRAN) IV, sodium chloride ? ?Assessment/ Plan:  ?Ms. DEENA SHAUB is a 76 y.o.  female a past medical history of hypertension, hyperlipidemia, GERD who is admitted with Hyponatremia [E87.1] ?Nausea and vomiting in adult [R11.2] ?COVID-19 [U07.1] ? ? 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  Serum osmolality low, urine osmolality high.  Sodium has decreased with IV fluids.  Current sodium level improved to 125.  Continue treatment with sodium chloride tablets and furosemide.  We will continue to monitor sodium every 4 hours with a  goal of 128-130 ? ?COVID-19. ?Currently receiving remdesivir and supportive treatment. ? ?Hypokalemia/hypocalcemia ?Expect these to correct with improved nutrition and fluid volume status ? ? LOS: 2 ?[place] ?3/6/20232:38 PM ?  ?

## 2021-05-03 LAB — BASIC METABOLIC PANEL
Anion gap: 12 (ref 5–15)
Anion gap: 8 (ref 5–15)
Anion gap: 9 (ref 5–15)
BUN: 10 mg/dL (ref 8–23)
BUN: 11 mg/dL (ref 8–23)
BUN: 11 mg/dL (ref 8–23)
CO2: 23 mmol/L (ref 22–32)
CO2: 28 mmol/L (ref 22–32)
CO2: 29 mmol/L (ref 22–32)
Calcium: 8 mg/dL — ABNORMAL LOW (ref 8.9–10.3)
Calcium: 8.2 mg/dL — ABNORMAL LOW (ref 8.9–10.3)
Calcium: 8.2 mg/dL — ABNORMAL LOW (ref 8.9–10.3)
Chloride: 90 mmol/L — ABNORMAL LOW (ref 98–111)
Chloride: 91 mmol/L — ABNORMAL LOW (ref 98–111)
Chloride: 91 mmol/L — ABNORMAL LOW (ref 98–111)
Creatinine, Ser: 0.79 mg/dL (ref 0.44–1.00)
Creatinine, Ser: 0.83 mg/dL (ref 0.44–1.00)
Creatinine, Ser: 0.93 mg/dL (ref 0.44–1.00)
GFR, Estimated: >60 mL/min (ref >60)
GFR, Estimated: >60 mL/min (ref >60)
GFR, Estimated: >60 mL/min (ref >60)
Glucose, Bld: 106 mg/dL — ABNORMAL HIGH (ref 70–99)
Glucose, Bld: 113 mg/dL — ABNORMAL HIGH (ref 70–99)
Glucose, Bld: 129 mg/dL — ABNORMAL HIGH (ref 70–99)
Potassium: 3.3 mmol/L — ABNORMAL LOW (ref 3.5–5.1)
Potassium: 3.3 mmol/L — ABNORMAL LOW (ref 3.5–5.1)
Potassium: 3.3 mmol/L — ABNORMAL LOW (ref 3.5–5.1)
Sodium: 125 mmol/L — ABNORMAL LOW (ref 135–145)
Sodium: 127 mmol/L — ABNORMAL LOW (ref 135–145)
Sodium: 129 mmol/L — ABNORMAL LOW (ref 135–145)

## 2021-05-03 LAB — CBC WITH DIFFERENTIAL/PLATELET
Abs Immature Granulocytes: 1.51 10*3/uL — ABNORMAL HIGH (ref 0.00–0.07)
Basophils Absolute: 0.1 10*3/uL (ref 0.0–0.1)
Basophils Relative: 1 %
Eosinophils Absolute: 0.3 10*3/uL (ref 0.0–0.5)
Eosinophils Relative: 2 %
HCT: 31.2 % — ABNORMAL LOW (ref 36.0–46.0)
Hemoglobin: 11.1 g/dL — ABNORMAL LOW (ref 12.0–15.0)
Immature Granulocytes: 11 %
Lymphocytes Relative: 13 %
Lymphs Abs: 1.9 10*3/uL (ref 0.7–4.0)
MCH: 29.1 pg (ref 26.0–34.0)
MCHC: 35.6 g/dL (ref 30.0–36.0)
MCV: 81.7 fL (ref 80.0–100.0)
Monocytes Absolute: 1.5 10*3/uL — ABNORMAL HIGH (ref 0.1–1.0)
Monocytes Relative: 11 %
Neutro Abs: 8.7 10*3/uL — ABNORMAL HIGH (ref 1.7–7.7)
Neutrophils Relative %: 62 %
Platelets: 369 10*3/uL (ref 150–400)
RBC: 3.82 MIL/uL — ABNORMAL LOW (ref 3.87–5.11)
RDW: 11.9 % (ref 11.5–15.5)
WBC: 14.1 10*3/uL — ABNORMAL HIGH (ref 4.0–10.5)
nRBC: 0 % (ref 0.0–0.2)

## 2021-05-03 LAB — COMPREHENSIVE METABOLIC PANEL
ALT: 24 U/L (ref 0–44)
AST: 29 U/L (ref 15–41)
Albumin: 3 g/dL — ABNORMAL LOW (ref 3.5–5.0)
Alkaline Phosphatase: 39 U/L (ref 38–126)
Anion gap: 7 (ref 5–15)
BUN: 12 mg/dL (ref 8–23)
CO2: 28 mmol/L (ref 22–32)
Calcium: 7.8 mg/dL — ABNORMAL LOW (ref 8.9–10.3)
Chloride: 92 mmol/L — ABNORMAL LOW (ref 98–111)
Creatinine, Ser: 0.95 mg/dL (ref 0.44–1.00)
GFR, Estimated: >60 mL/min (ref >60)
Glucose, Bld: 101 mg/dL — ABNORMAL HIGH (ref 70–99)
Potassium: 3.3 mmol/L — ABNORMAL LOW (ref 3.5–5.1)
Sodium: 127 mmol/L — ABNORMAL LOW (ref 135–145)
Total Bilirubin: 0.5 mg/dL (ref 0.3–1.2)
Total Protein: 5.5 g/dL — ABNORMAL LOW (ref 6.5–8.1)

## 2021-05-03 MED ORDER — CALCIUM CARBONATE 1250 (500 CA) MG PO TABS: 1.0000 | ORAL_TABLET | Freq: Two times a day (BID) | ORAL | 2 refills | Status: DC

## 2021-05-03 MED ORDER — SODIUM CHLORIDE 1 G PO TABS
2.0000 g | ORAL_TABLET | Freq: Two times a day (BID) | ORAL | 0 refills | Status: DC
Start: 2021-05-03 — End: 2021-12-19

## 2021-05-03 MED ORDER — CALCIUM CARBONATE 1250 (500 CA) MG PO TABS
1.0000 | ORAL_TABLET | Freq: Two times a day (BID) | ORAL | Status: DC
  Administered 2021-05-03: 500 mg via ORAL
  Filled 2021-05-03 (×2): qty 1

## 2021-05-03 MED ORDER — POTASSIUM CHLORIDE CRYS ER 20 MEQ PO TBCR
40.0000 meq | EXTENDED_RELEASE_TABLET | Freq: Once | ORAL | Status: AC
  Administered 2021-05-03: 40 meq via ORAL
  Filled 2021-05-03: qty 2

## 2021-05-03 MED ORDER — POTASSIUM CHLORIDE CRYS ER 20 MEQ PO TBCR: 40.0000 meq | EXTENDED_RELEASE_TABLET | Freq: Once | ORAL | Status: DC

## 2021-05-03 MED ORDER — ZINC SULFATE 220 (50 ZN) MG PO CAPS: 220.0000 mg | ORAL_CAPSULE | Freq: Every day | ORAL | 0 refills | Status: DC

## 2021-05-03 MED ORDER — ASCORBIC ACID 500 MG PO TABS: 500.0000 mg | ORAL_TABLET | Freq: Every day | ORAL | 0 refills | Status: AC

## 2021-05-03 NOTE — Discharge Summary (Signed)
Physician Discharge Summary   Patient: Wendy [name] MRN: [id number] DOB: 1945/03/11  Admit date:     04/30/2021  Discharge date: 05/03/21  Discharge Physician: Lorella Nimrod   PCP: Reynold Bowen, MD   Recommendations at discharge:  Please get BMP on Friday. Patient needs a close follow-up for her sodium levels. Patient needs fluid restriction with some added salt which can be used as salt tablets.  Discharge Diagnoses: Principal Problem:   Hyponatremia Active Problems:   Weakness   COVID-19 virus infection   HTN (hypertension)   GERD   Hospital Course: Taken from H&P.  Wendy [name] is a 76 y.o. female with medical history significant for hypertension, dyslipidemia, prior history of hyponatremia, who presented to [facility] ED with complaints of generalized weakness, persistent nausea and vomiting as well as inability to tolerate any oral intake.  Tested positive for COVID-19 virus on 04/23/2021.  Completed a course of Paxlovid outpatient.  Work-up revealed severe hypovolemic hyponatremia, COVID-19 screening test positive on 04/28/2021.   Hyponatremia labs with low serum osmolality, high urine osmolality and high urinary sodium.  Sodium decreased with initial IV fluid.  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  Nephrology was consulted and patient was placed on fluid restricted diet with some improvement in sodium.  3/6: Sodium improving with fluid restriction, completed the quarantine time. She has already completed outpatient Paxlovid, no need for remdesivir-discontinued today.  Her sodium continued to improve, it was 129 before discharge.  She was given a prescription for salt tablets, she can also use as added salt in her food if those tablets are very expensive. She needs to restrict her fluid to 1200 cc. She will also need a repeat BMP on Friday.  Patient did received Lasix while in the hospital, we did not continue as he was on spironolactone at home.  Appears euvolemic.  She can continue her home  medications and have a close follow-up with primary care provider for further recommendations.  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  Nephrology is on board. Sodium now improving with fluid restriction. -Continue with Lasix -Monitor sodium. -Restrict fluid. -Goal increase of 8-10 in 24-hour, currently at 129.  COVID-19 virus infection Patient initial positive COVID-19 PCR test was on 04/23/21 She is vaccinated and received 1 booster dose She completed a course of Paxlovid but remains symptomatic Her COVID-19 cycle threshold is 29.6 Patient was placed on remdesivir-already almost 10 days out, -Discontinue remdesivir. -Continue with supportive care -Continue with supplements  Weakness Multifactorial with hyponatremia and recent COVID infection. -PT/OT is recommending home health  HTN (hypertension) Blood pressure within goal -Continue metoprolol and losartan   GERD - Continue with Protonix   Consultants: Nephrology Procedures performed: None Disposition: Home Diet recommendation:  Regular diet DISCHARGE MEDICATION: Allergies as of 05/03/2021       Reactions   Hctz [hydrochlorothiazide] Other (See Comments)   hyponatremia   Codeine Nausea Only, Nausea And Vomiting   Hydrocodone-acetaminophen Nausea And Vomiting   Lisinopril Other (See Comments)   REACTION: INTOL to Prinivil w/ pain on urination   Neomycin Dermatitis   Other Nausea Only   fragrance   Oxycodone Nausea And Vomiting   Pravastatin Sodium Other (See Comments)   REACTION: INTOL to Pravachol w/ myalgias        Medication List     STOP taking these medications    Estradiol 10 MCG Tabs vaginal tablet  TAKE these medications    ascorbic acid 500 MG tablet Commonly known as: VITAMIN C Take 1 tablet (500 mg total) by mouth daily. Start taking on: May 04, 2021   aspirin 81 MG  tablet Take 81 mg by mouth daily.   b complex vitamins tablet Take 1 tablet by mouth daily.   benzonatate 100 MG capsule Commonly known as: TESSALON Take 1 capsule (100 mg total) by mouth every 8 (eight) hours.   calcium carbonate 1250 (500 Ca) MG tablet Commonly known as: OS-CAL - dosed in mg of elemental calcium Take 1 tablet (500 mg of elemental calcium total) by mouth 2 (two) times daily with a meal.   CoQ10 200 MG Caps Take 1 capsule by mouth daily.   DIGEST ENZYMES-ANTICHOLINERGIC PO Take 1 capsule by mouth 3 (three) times daily.   FISH OIL PO Take 1 capsule by mouth daily.   ibuprofen 200 MG tablet Commonly known as: ADVIL Take 200 mg by mouth every 6 (six) hours as needed for moderate pain.   losartan 50 MG tablet Commonly known as: COZAAR Take 50 mg by mouth daily. What changed: Another medication with the same name was removed. Continue taking this medication, and follow the directions you see here.   metoprolol succinate 25 MG 24 hr tablet Commonly known as: TOPROL-XL Take 12.5 mg by mouth daily.   MULTIVITAMIN & MINERAL PO Take 1 tablet by mouth daily.   ondansetron 4 MG tablet Commonly known as: ZOFRAN Take 4 mg by mouth every 8 (eight) hours as needed.   Red Yeast Rice 600 MG Caps Take 2 capsules by mouth daily.   rosuvastatin 5 MG tablet Commonly known as: CRESTOR Take 5 mg by mouth once a week.   sodium chloride 1 g tablet Take 2 tablets (2 g total) by mouth 2 (two) times daily with a meal.   spironolactone 25 MG tablet Commonly known as: ALDACTONE Take 25 mg by mouth daily.   Tart Cherry Advanced Caps Take 2 capsules by mouth daily.   Vitamin D 50 MCG (2000 UT) Caps Take 1 capsule by mouth daily.   zinc sulfate 220 (50 Zn) MG capsule Take 1 capsule (220 mg total) by mouth daily. Start taking on: May 04, 2021        Follow-up Information     Reynold Bowen, MD. Schedule an appointment as soon as possible for a visit.    Specialty: Endocrinology Why: Please make appointment for upcoming Friday. Contact information: [address] [place] [address] [phone]                Discharge Exam: Danley Danker Weights   04/30/21 0548  Weight: 63.5 kg   General.  Pleasant elderly lady, in no acute distress. Pulmonary.  Lungs clear bilaterally, normal respiratory effort. CV.  Regular rate and rhythm, no JVD, rub or murmur. Abdomen.  Soft, nontender, nondistended, BS positive. CNS.  Alert and oriented .  No focal neurologic deficit. Extremities.  No edema, no cyanosis, pulses intact and symmetrical. Psychiatry.  Judgment and insight appears normal.   Condition at discharge: stable  The results of significant diagnostics from this hospitalization (including imaging, microbiology, ancillary and laboratory) are listed below for reference.   Imaging Studies: DG Chest 2 View  Result Date: 04/28/2021 CLINICAL DATA:  Cough. EXAM: CHEST - 2 VIEW COMPARISON:  Chest x-ray dated April 28, 2014. FINDINGS: The heart remains at the upper limits of normal in size. Normal mediastinal contours. Normal pulmonary vascularity. No  focal consolidation, pleural effusion, or pneumothorax. No acute osseous abnormality. IMPRESSION: 1. No acute cardiopulmonary disease. Electronically Signed   By: Titus Dubin M.D.   On: 04/28/2021 09:49   MM 3D SCREEN BREAST BILATERAL  Result Date: 04/04/2021 CLINICAL DATA:  Screening. EXAM: DIGITAL SCREENING BILATERAL MAMMOGRAM WITH TOMOSYNTHESIS AND CAD TECHNIQUE: Bilateral screening digital craniocaudal and mediolateral oblique mammograms were obtained. Bilateral screening digital breast tomosynthesis was performed. The images were evaluated with computer-aided detection. COMPARISON:  Previous exam(s). ACR Breast Density Category b: There are scattered areas of fibroglandular density. FINDINGS: There are no findings suspicious for malignancy. IMPRESSION: No mammographic evidence of malignancy.  A result letter of this screening mammogram will be mailed directly to the patient. RECOMMENDATION: Screening mammogram in one year. (Code:[id number]) BI-RADS CATEGORY  1: Negative. Electronically Signed   By: Marin Olp M.D.   On: 04/04/2021 14:31    Microbiology: Results for orders placed or performed during the hospital encounter of 04/28/21  Resp Panel by RT-PCR (Flu A&B, Covid) Nasopharyngeal Swab     Status: Abnormal   Collection Time: 04/28/21  9:55 AM   Specimen: Nasopharyngeal Swab; Nasopharyngeal(NP) swabs in vial transport medium  Result Value Ref Range Status   SARS Coronavirus 2 by RT PCR POSITIVE (A) NEGATIVE Final    Comment: (NOTE) SARS-CoV-2 target nucleic acids are DETECTED.  The SARS-CoV-2 RNA is generally detectable in upper respiratory specimens during the acute phase of infection. Positive results are indicative of the presence of the identified virus, but do not rule out bacterial infection or co-infection with other pathogens not detected by the test. Clinical correlation with patient history and other diagnostic information is necessary to determine patient infection status. The expected result is Negative.  Fact Sheet for Patients: EntrepreneurPulse.com.au  Fact Sheet for Healthcare Providers: IncredibleEmployment.be  This test is not yet approved or cleared by the [place] FDA and  has been authorized for detection and/or diagnosis of SARS-CoV-2 by FDA under an Emergency Use Authorization (EUA).  This EUA will remain in effect (meaning this test can be used) for the duration of  the COVID-19 declaration under Section 564(b)(1) of the A ct, 21 U.S.C. section 360bbb-3(b)(1), unless the authorization is terminated or revoked sooner.     Influenza A by PCR NEGATIVE NEGATIVE Final   Influenza B by PCR NEGATIVE NEGATIVE Final    Comment: (NOTE) The Xpert Xpress SARS-CoV-2/FLU/RSV plus assay is intended as an aid in  the diagnosis of influenza from Nasopharyngeal swab specimens and should not be used as a sole basis for treatment. Nasal washings and aspirates are unacceptable for Xpert Xpress SARS-CoV-2/FLU/RSV testing.  Fact Sheet for Patients: EntrepreneurPulse.com.au  Fact Sheet for Healthcare Providers: IncredibleEmployment.be  This test is not yet approved or cleared by the [place] FDA and has been authorized for detection and/or diagnosis of SARS-CoV-2 by FDA under an Emergency Use Authorization (EUA). This EUA will remain in effect (meaning this test can be used) for the duration of the COVID-19 declaration under Section 564(b)(1) of the Act, 21 U.S.C. section 360bbb-3(b)(1), unless the authorization is terminated or revoked.  Performed at [facility], [address], [place], [place] [address]     Labs: CBC: Recent Labs  Lab 04/30/21 [phone] 05/01/21 0416 05/02/21 0421 05/03/21 0355  WBC 13.7* 13.3* 13.0* 14.1*  NEUTROABS 10.8* 9.1* 8.3* 8.7*  HGB 13.1 10.5* 11.7* 11.1*  HCT 36.6 29.0* 32.6* 31.2*  MCV 79.9* 79.9* 81.1 81.7  PLT 356 330 375 369  Basic Metabolic Panel: Recent Labs  Lab 04/30/21 0607 05/01/21 0416 05/02/21 0421 05/02/21 0844 05/02/21 1553 05/02/21 1955 05/02/21 2343 05/03/21 0355 05/03/21 0806  NA 111*   < > 122*   < > 124* 127* 127* 127* 129*  K 4.1   < > 3.4*   < > 3.2* 3.3* 3.3* 3.3* 3.3*  CL 76*   < > 89*   < > 88* 93* 91* 92* 91*  CO2 24   < > 26   < > '27 25 28 28 29  '$ GLUCOSE 159*   < > 89   < > 129* 155* 113* 101* 106*  BUN 9   < > 8   < > '9 10 11 12 11  '$ CREATININE 0.62   < > 0.79   < > 0.92 1.00 0.93 0.95 0.83  CALCIUM 8.4*   < > 8.0*   < > 7.9* 8.2* 8.0* 7.8* 8.2*  MG 1.6*  --  2.2  --   --   --   --   --   --   PHOS  --   --  2.2*  --   --   --   --   --   --    < > = values in this interval not displayed.   Liver Function Tests: Recent Labs  Lab 04/30/21 0607  05/01/21 0416 05/02/21 0421 05/03/21 0355  AST '28 26 26 29  '$ ALT '17 16 19 24  '$ ALKPHOS 42 30* 34* 39  BILITOT 1.3* 0.9 0.6 0.5  PROT 6.2* 5.2* 5.6* 5.5*  ALBUMIN 3.5 2.8* 3.0* 3.0*   CBG: No results for input(s): GLUCAP in the last 168 hours.  Discharge time spent: greater than 30 minutes.  This record has been created using Systems analyst. Errors have been sought and corrected,but may not always be located. Such creation errors do not reflect on the standard of care.   Signed: Lorella Nimrod, MD [facility] 05/03/2021

## 2021-05-03 NOTE — Progress Notes (Signed)
?[name]  ?ROUNDING NOTE  ? ?Subjective:  ? ?Patient seen ambulating in room, husband at bedside ?Airborne isolation precautions discontinued ?Tolerating meals ?Adequate urine output recorded ?No lower extremity edema ? ?Sodium 129 ? ?Objective:  ?Vital signs in last 24 hours:  ?Temp:  [97.6 ?F (36.4 ?C)-98.8 ?F (37.1 ?C)] 97.9 ?F (36.6 ?C) (03/07 1123) ?Pulse Rate:  [57-66] 65 (03/07 1123) ?Resp:  [14-19] 16 (03/07 1123) ?BP: (126-150)/(57-78) 150/70 (03/07 1123) ?SpO2:  [96 %-99 %] 99 % (03/07 1123) ? ?Weight change:  ?Filed Weights  ? 04/30/21 0548  ?Weight: 63.5 kg  ? ? ?Intake/Output: ?I/O last 3 completed shifts: ?In: 1590 [P.O.:1440; IV Piggyback:150] ?Out: -  ?  ?Intake/Output this shift: ? Total I/O ?In: 480 [P.O.:480] ?Out: 350 [Urine:350] ? ?Physical Exam: ?General: NAD  ?Head: Normocephalic, atraumatic. Moist oral mucosal membranes  ?Eyes: Anicteric  ?Lungs:  Clear to auscultation, normal effort  ?Heart: Regular rate and rhythm  ?Abdomen:  Soft, nontender   ?Extremities: No peripheral edema.  ?Neurologic: Nonfocal, moving all four extremities  ?Skin: No lesions  ?   ? ? ?Basic Metabolic Panel: ?Recent Labs  ?Lab 04/30/21 ?0607 05/01/21 ?0416 05/02/21 ?0421 05/02/21 ?2094 05/02/21 ?1955 05/02/21 ?7096 05/03/21 ?0355 05/03/21 ?2836 05/03/21 ?1255  ?NA 111*   < > 122*   < > 127* 127* 127* 129* 125*  ?K 4.1   < > 3.4*   < > 3.3* 3.3* 3.3* 3.3* 3.3*  ?CL 76*   < > 89*   < > 93* 91* 92* 91* 90*  ?CO2 24   < > 26   < > '25 28 28 29 23  '$ ?GLUCOSE 159*   < > 89   < > 155* 113* 101* 106* 129*  ?BUN 9   < > 8   < > '10 11 12 11 10  '$ ?CREATININE 0.62   < > 0.79   < > 1.00 0.93 0.95 0.83 0.79  ?CALCIUM 8.4*   < > 8.0*   < > 8.2* 8.0* 7.8* 8.2* 8.2*  ?MG 1.6*  --  2.2  --   --   --   --   --   --   ?PHOS  --   --  2.2*  --   --   --   --   --   --   ? < > = values in this interval not displayed.  ? ? ? ?Liver Function Tests: ?Recent Labs  ?Lab 04/30/21 ?6294 05/01/21 ?0416 05/02/21 ?0421 05/03/21 ?7654   ?AST '28 26 26 29  '$ ?ALT '17 16 19 24  '$ ?ALKPHOS 42 30* 34* 39  ?BILITOT 1.3* 0.9 0.6 0.5  ?PROT 6.2* 5.2* 5.6* 5.5*  ?ALBUMIN 3.5 2.8* 3.0* 3.0*  ? ? ?Recent Labs  ?Lab 04/30/21 ?0607  ?LIPASE 34  ? ? ?No results for input(s): AMMONIA in the last 168 hours. ? ?CBC: ?Recent Labs  ?Lab 04/30/21 ?6503 05/01/21 ?0416 05/02/21 ?0421 05/03/21 ?5465  ?WBC 13.7* 13.3* 13.0* 14.1*  ?NEUTROABS 10.8* 9.1* 8.3* 8.7*  ?HGB 13.1 10.5* 11.7* 11.1*  ?HCT 36.6 29.0* 32.6* 31.2*  ?MCV 79.9* 79.9* 81.1 81.7  ?PLT 356 330 375 369  ? ? ? ?Cardiac Enzymes: ?No results for input(s): CKTOTAL, CKMB, CKMBINDEX, TROPONINI in the last 168 hours. ? ?BNP: ?Invalid input(s): POCBNP ? ?CBG: ?No results for input(s): GLUCAP in the last 168 hours. ? ?Microbiology: ?Results for orders placed or performed during the hospital encounter of 04/28/21  ?Resp Panel by  RT-PCR (Flu A&B, Covid) Nasopharyngeal Swab     Status: Abnormal  ? Collection Time: 04/28/21  9:55 AM  ? Specimen: Nasopharyngeal Swab; Nasopharyngeal(NP) swabs in vial transport medium  ?Result Value Ref Range Status  ? SARS Coronavirus 2 by RT PCR POSITIVE (A) NEGATIVE Final  ?  Comment: (NOTE) ?SARS-CoV-2 target nucleic acids are DETECTED. ? ?The SARS-CoV-2 RNA is generally detectable in upper respiratory ?specimens during the acute phase of infection. Positive results are ?indicative of the presence of the identified virus, but do not rule ?out bacterial infection or co-infection with other pathogens not ?detected by the test. Clinical correlation with patient history and ?other diagnostic information is necessary to determine patient ?infection status. The expected result is Negative. ? ?Fact Sheet for Patients: ?EntrepreneurPulse.com.au ? ?Fact Sheet for Healthcare Providers: ?IncredibleEmployment.be ? ?This test is not yet approved or cleared by the [place] FDA and  ?has been authorized for detection and/or diagnosis of SARS-CoV-2 by ?FDA under an  Emergency Use Authorization (EUA).  This EUA will ?remain in effect (meaning this test can be used) for the duration of  ?the COVID-19 declaration under Section 564(b)(1) of the A ct, 21 ?U.S.C. section 360bbb-3(b)(1), unless the authorization is ?terminated or revoked sooner. ? ?  ? Influenza A by PCR NEGATIVE NEGATIVE Final  ? Influenza B by PCR NEGATIVE NEGATIVE Final  ?  Comment: (NOTE) ?The Xpert Xpress SARS-CoV-2/FLU/RSV plus assay is intended as an aid ?in the diagnosis of influenza from Nasopharyngeal swab specimens and ?should not be used as a sole basis for treatment. Nasal washings and ?aspirates are unacceptable for Xpert Xpress SARS-CoV-2/FLU/RSV ?testing. ? ?Fact Sheet for Patients: ?EntrepreneurPulse.com.au ? ?Fact Sheet for Healthcare Providers: ?IncredibleEmployment.be ? ?This test is not yet approved or cleared by the [place] FDA and ?has been authorized for detection and/or diagnosis of SARS-CoV-2 by ?FDA under an Emergency Use Authorization (EUA). This EUA will remain ?in effect (meaning this test can be used) for the duration of the ?COVID-19 declaration under Section 564(b)(1) of the Act, 21 U.S.C. ?section 360bbb-3(b)(1), unless the authorization is terminated or ?revoked. ? ?Performed at [facility], [address] ?[place], [place] [address] ?  ? ? ?Coagulation Studies: ?No results for input(s): LABPROT, INR in the last 72 hours. ? ?Urinalysis: ?No results for input(s): COLORURINE, LABSPEC, [place], GLUCOSEU, HGBUR, BILIRUBINUR, KETONESUR, PROTEINUR, UROBILINOGEN, NITRITE, LEUKOCYTESUR in the last 72 hours. ? ?Invalid input(s): APPERANCEUR ?  ? ? ?Imaging: ?No results found. ? ? ?Medications:  ? ? ? albuterol  2 puff Inhalation Q6H  ? vitamin C  500 mg Oral Daily  ? aspirin EC  81 mg Oral Daily  ? benzonatate  200 mg Oral TID  ? calcium carbonate  1 tablet Oral BID WC  ? cholecalciferol  2,000 Units Oral Daily  ? enoxaparin  (LOVENOX) injection  40 mg Subcutaneous Q24H  ? furosemide  20 mg Oral Daily  ? metoprolol succinate  12.5 mg Oral Daily  ? multivitamin with minerals  1 tablet Oral Daily  ? omega-3 acid ethyl esters  1 g Oral Daily  ? pantoprazole  40 mg Oral Daily  ? potassium chloride  40 mEq Oral Once  ? sodium chloride  2 g Oral BID WC  ? zinc sulfate  220 mg Oral Daily  ? ?acetaminophen, fluticasone, guaiFENesin-dextromethorphan, melatonin, ondansetron **OR** ondansetron (ZOFRAN) IV, sodium chloride ? ?Assessment/ Plan:  ?Ms. YANIQUE MULVIHILL is a 76 y.o.  female a past medical history of hypertension,  hyperlipidemia, GERD who is admitted with Hyponatremia [E87.1] ?Nausea and vomiting in adult [R11.2] ?COVID-19 [U07.1] ? ? 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  Serum osmolality low, urine osmolality high.  ? Sodium continues to improve, 129.  Prescribed IV furosemide decreased to daily.  Patient encouraged to maintain fluid restriction.  Based on presentation, we feel patient is cleared for discharge.  Encouraged to follow-up with PCP for sodium levels. ? ?COVID-19. ?Isolation precautions discontinued. ? ?Hypokalemia/hypocalcemia ?These will correct with oral nutrition and fluid status. ? ? LOS: 3 ?[place] ?3/7/20231:46 PM ?  ?

## 2021-05-03 NOTE — Assessment & Plan Note (Signed)
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  Nephrology is on board. ?Sodium now improving with fluid restriction. ?-Continue with Lasix ?-Monitor sodium. ?-Restrict fluid. ?-Goal increase of 8-10 in 24-hour, currently at 129. ?

## 2021-05-03 NOTE — Progress Notes (Signed)
Physical Therapy Treatment ?Patient Details ?Name: Wendy [name] ?MRN: [id number] ?DOB: Jul 18, 1945 ?Today's Date: 05/03/2021 ? ? ?History of Present Illness Pt admitted to [facility] on 04/30/21 for c/o COVID-like symptoms, after testing positive for COVID last Saturday. Pt completed course of Paxlovid without improvements in symptoms. She was seen at the [facility] ER 2 days prior to this hospitalization for worsening upper respiratory illness and was discharged home. Found to have hyponatremia at 111. Significant PMH includes: HTN, HLD, IBS, anxiety. ? ?  ?PT Comments  ? ? Pt received upright in recliner agreeable to PT services. Pt displaying independence in standing from recliner and independently ambulating in room. Focus of session on assessing dynamic balance without need for AD. Pt performing 5xSTS in 9.7 sec and scoring a 20 on DGI while ambulating ~360' without AD with supervision to minguard. These scores are reflective for adequate LE strength and at a low risk of falls for age matched norms. Pt returned to room in recliner and educated pt appears near baseline and does not require [facility] PT services. PT in agreement. D/c recs updated. Pt remains motivated and continues to improve in her functional mobility to her baseline status.  ?   ?Recommendations for follow up therapy are one component of a multi-disciplinary discharge planning process, led by the attending physician.  Recommendations may be updated based on patient status, additional functional criteria and insurance authorization. ? ?Follow Up Recommendations ? No PT follow up ?  ?  ?Assistance Recommended at Discharge PRN  ?Patient can return home with the following Assist for transportation;A little help with walking and/or transfers;Assistance with cooking/housework ?  ?Equipment Recommendations ?    ?  ?Recommendations for Other Services   ? ? ?  ?Precautions / Restrictions Precautions ?Precautions: Fall ?Restrictions ?Weight Bearing Restrictions: No  ?   ? ?Mobility ? Bed Mobility ?  ?  ?  ?  ?  ?  ?  ?General bed mobility comments: NT. In recliner pre and post session. ?Patient Response: Cooperative ? ?Transfers ?Overall transfer level: Needs assistance ?Equipment used: None ?Transfers: Sit to/from Stand ?Sit to Stand: Supervision ?  ?  ?  ?  ?  ?  ?  ? ?Ambulation/Gait ?Ambulation/Gait assistance: Supervision, Min guard ?Gait Distance (Feet): 360 Feet ?Assistive device: None ?Gait Pattern/deviations: Step-through pattern ?  ?  ?  ?General Gait Details: Performing gait with no AD today with good stability. Minguard provided for dynamic balance tasks. ? ? ?Stairs ?Stairs: Yes ?Stairs assistance: Supervision ?Stair Management: One rail Right, Alternating pattern, Forwards ?Number of Stairs: 6 ?  ? ? ?Wheelchair Mobility ?  ? ?Modified Rankin (Stroke Patients Only) ?  ? ? ?  ?Balance Overall balance assessment: Needs assistance ?Sitting-balance support: No upper extremity supported, Feet supported ?Sitting balance-Leahy Scale: Normal ?  ?  ?Standing balance support: No upper extremity supported ?Standing balance-Leahy Scale: Good ?  ?  ?  ?  ?  ?  ?  ?  ?  ?Standardized Balance Assessment ?Standardized Balance Assessment : Dynamic Gait Index ?  ?Dynamic Gait Index ?Level Surface: Normal ?Change in Gait Speed: Normal ?Gait with Horizontal Head Turns: Normal ?Gait with Vertical Head Turns: Mild Impairment ?Gait and Pivot Turn: Normal ?Step Over Obstacle: Mild Impairment ?Step Around Obstacles: Mild Impairment ?Steps: Mild Impairment ?Total Score: 20 ?  ? ?  ?Cognition Arousal/Alertness: Awake/alert ?Behavior During Therapy: [facility] for tasks assessed/performed ?Overall Cognitive Status: Within Functional Limits for tasks assessed ?  ?  ?  ?  ?  ?  ?  ?  ?  ?  ?  ?  ?  ?  ?  ?  ?  ?  ?  ? ?  ?  Exercises Other Exercises ?Other Exercises: 5x STS performed in 9.7 seconds indicating pt has normal LE strength for age matched norms. ? ?  ?General Comments General comments (skin  integrity, edema, etc.): Scoring 20 on DGI indicating pt is not at high risk for falls. ?  ?  ? ?Pertinent Vitals/Pain Pain Assessment ?Pain Assessment: No/denies pain  ? ? ?Home Living   ?  ?  ?  ?  ?  ?  ?  ?  ?  ?   ?  ?Prior Function    ?  ?  ?   ? ?PT Goals (current goals can now be found in the care plan section) Acute Rehab PT Goals ?Patient Stated Goal: improve balance and strength ?PT Goal Formulation: With patient/family ?Time For Goal Achievement: 05/16/21 ?Potential to Achieve Goals: Good ?Progress towards PT goals: Progressing toward goals ? ?  ?Frequency ? ? ? Min 2X/week ? ? ? ?  ?PT Plan Discharge plan needs to be updated  ? ? ?Co-evaluation   ?  ?  ?  ?  ? ?  ?AM-PAC PT "6 Clicks" Mobility   ?Outcome Measure ? Help needed turning from your back to your side while in a flat bed without using bedrails?: None ?Help needed moving from lying on your back to sitting on the side of a flat bed without using bedrails?: None ?Help needed moving to and from a bed to a chair (including a wheelchair)?: None ?Help needed standing up from a chair using your arms (e.g., wheelchair or bedside chair)?: A Little ?Help needed to walk in hospital room?: None ?Help needed climbing 3-5 steps with a railing? : A Little ?6 Click Score: 22 ? ?  ?End of Session Equipment Utilized During Treatment: Gait belt ?Activity Tolerance: Patient tolerated treatment well ?Patient left: in chair;with family/visitor present ?Nurse Communication: Mobility status ?PT Visit Diagnosis: Unsteadiness on feet (R26.81);Muscle weakness (generalized) (M62.81) ?  ? ? ?Time: 1100-1117 ?PT Time Calculation (min) (ACUTE ONLY): 17 min ? ?Charges:  $Neuromuscular Re-education: 8-22 mins          ?          ?Salem Caster. Fairly IV, PT, DPT ?Physical Therapist- [place]  ?[facility]  ?05/03/2021, 1:17 PM ? ?

## 2021-05-04 NOTE — Clinical Social Work Note (Signed)
Verbal order from Dr. Reesa Chew for Wendy [name]. ? ? ?

## 2021-05-05 DIAGNOSIS — E871 Hypo-osmolality and hyponatremia: Secondary | ICD-10-CM | POA: Diagnosis not present

## 2021-05-05 DIAGNOSIS — I10 Essential (primary) hypertension: Secondary | ICD-10-CM | POA: Diagnosis not present

## 2021-05-05 DIAGNOSIS — U071 COVID-19: Secondary | ICD-10-CM | POA: Diagnosis not present

## 2021-05-05 DIAGNOSIS — E222 Syndrome of inappropriate secretion of antidiuretic hormone: Secondary | ICD-10-CM | POA: Diagnosis not present

## 2021-05-05 DIAGNOSIS — J1282 Pneumonia due to coronavirus disease 2019: Secondary | ICD-10-CM | POA: Diagnosis not present

## 2021-05-06 DIAGNOSIS — K5790 Diverticulosis of intestine, part unspecified, without perforation or abscess without bleeding: Secondary | ICD-10-CM | POA: Diagnosis not present

## 2021-05-06 DIAGNOSIS — M47812 Spondylosis without myelopathy or radiculopathy, cervical region: Secondary | ICD-10-CM | POA: Diagnosis not present

## 2021-05-06 DIAGNOSIS — Z9181 History of falling: Secondary | ICD-10-CM | POA: Diagnosis not present

## 2021-05-06 DIAGNOSIS — Z7982 Long term (current) use of aspirin: Secondary | ICD-10-CM | POA: Diagnosis not present

## 2021-05-06 DIAGNOSIS — F419 Anxiety disorder, unspecified: Secondary | ICD-10-CM | POA: Diagnosis not present

## 2021-05-06 DIAGNOSIS — I10 Essential (primary) hypertension: Secondary | ICD-10-CM | POA: Diagnosis not present

## 2021-05-06 DIAGNOSIS — U071 COVID-19: Secondary | ICD-10-CM | POA: Diagnosis not present

## 2021-05-06 DIAGNOSIS — K589 Irritable bowel syndrome without diarrhea: Secondary | ICD-10-CM | POA: Diagnosis not present

## 2021-05-06 DIAGNOSIS — E7800 Pure hypercholesterolemia, unspecified: Secondary | ICD-10-CM | POA: Diagnosis not present

## 2021-05-06 DIAGNOSIS — E538 Deficiency of other specified B group vitamins: Secondary | ICD-10-CM | POA: Diagnosis not present

## 2021-05-06 DIAGNOSIS — E871 Hypo-osmolality and hyponatremia: Secondary | ICD-10-CM | POA: Diagnosis not present

## 2021-05-06 DIAGNOSIS — K219 Gastro-esophageal reflux disease without esophagitis: Secondary | ICD-10-CM | POA: Diagnosis not present

## 2021-05-06 DIAGNOSIS — Z791 Long term (current) use of non-steroidal anti-inflammatories (NSAID): Secondary | ICD-10-CM | POA: Diagnosis not present

## 2021-05-09 DIAGNOSIS — U071 COVID-19: Secondary | ICD-10-CM | POA: Diagnosis not present

## 2021-05-09 DIAGNOSIS — E871 Hypo-osmolality and hyponatremia: Secondary | ICD-10-CM | POA: Diagnosis not present

## 2021-05-09 DIAGNOSIS — I10 Essential (primary) hypertension: Secondary | ICD-10-CM | POA: Diagnosis not present

## 2021-05-09 DIAGNOSIS — E222 Syndrome of inappropriate secretion of antidiuretic hormone: Secondary | ICD-10-CM | POA: Diagnosis not present

## 2021-05-09 DIAGNOSIS — E875 Hyperkalemia: Secondary | ICD-10-CM | POA: Diagnosis not present

## 2021-05-09 DIAGNOSIS — J1282 Pneumonia due to coronavirus disease 2019: Secondary | ICD-10-CM | POA: Diagnosis not present

## 2021-05-10 ENCOUNTER — Other Ambulatory Visit (HOSPITAL_BASED_OUTPATIENT_CLINIC_OR_DEPARTMENT_OTHER): Payer: Self-pay

## 2021-05-13 DIAGNOSIS — E875 Hyperkalemia: Secondary | ICD-10-CM | POA: Diagnosis not present

## 2021-05-13 DIAGNOSIS — E871 Hypo-osmolality and hyponatremia: Secondary | ICD-10-CM | POA: Diagnosis not present

## 2021-05-13 DIAGNOSIS — E7800 Pure hypercholesterolemia, unspecified: Secondary | ICD-10-CM | POA: Diagnosis not present

## 2021-05-13 DIAGNOSIS — I10 Essential (primary) hypertension: Secondary | ICD-10-CM | POA: Diagnosis not present

## 2021-05-13 DIAGNOSIS — E222 Syndrome of inappropriate secretion of antidiuretic hormone: Secondary | ICD-10-CM | POA: Diagnosis not present

## 2021-05-13 DIAGNOSIS — K219 Gastro-esophageal reflux disease without esophagitis: Secondary | ICD-10-CM | POA: Diagnosis not present

## 2021-05-13 DIAGNOSIS — U071 COVID-19: Secondary | ICD-10-CM | POA: Diagnosis not present

## 2021-05-18 DIAGNOSIS — K219 Gastro-esophageal reflux disease without esophagitis: Secondary | ICD-10-CM | POA: Diagnosis not present

## 2021-05-18 DIAGNOSIS — E222 Syndrome of inappropriate secretion of antidiuretic hormone: Secondary | ICD-10-CM | POA: Diagnosis not present

## 2021-05-18 DIAGNOSIS — E7800 Pure hypercholesterolemia, unspecified: Secondary | ICD-10-CM | POA: Diagnosis not present

## 2021-05-18 DIAGNOSIS — E875 Hyperkalemia: Secondary | ICD-10-CM | POA: Diagnosis not present

## 2021-05-18 DIAGNOSIS — I10 Essential (primary) hypertension: Secondary | ICD-10-CM | POA: Diagnosis not present

## 2021-05-18 DIAGNOSIS — E871 Hypo-osmolality and hyponatremia: Secondary | ICD-10-CM | POA: Diagnosis not present

## 2021-05-18 DIAGNOSIS — U071 COVID-19: Secondary | ICD-10-CM | POA: Diagnosis not present

## 2021-05-26 DIAGNOSIS — E875 Hyperkalemia: Secondary | ICD-10-CM | POA: Diagnosis not present

## 2021-05-26 DIAGNOSIS — I10 Essential (primary) hypertension: Secondary | ICD-10-CM | POA: Diagnosis not present

## 2021-05-26 DIAGNOSIS — E871 Hypo-osmolality and hyponatremia: Secondary | ICD-10-CM | POA: Diagnosis not present

## 2021-05-26 DIAGNOSIS — E222 Syndrome of inappropriate secretion of antidiuretic hormone: Secondary | ICD-10-CM | POA: Diagnosis not present

## 2021-05-26 DIAGNOSIS — K219 Gastro-esophageal reflux disease without esophagitis: Secondary | ICD-10-CM | POA: Diagnosis not present

## 2021-05-31 DIAGNOSIS — D225 Melanocytic nevi of trunk: Secondary | ICD-10-CM | POA: Diagnosis not present

## 2021-05-31 DIAGNOSIS — Z85828 Personal history of other malignant neoplasm of skin: Secondary | ICD-10-CM | POA: Diagnosis not present

## 2021-05-31 DIAGNOSIS — L814 Other melanin hyperpigmentation: Secondary | ICD-10-CM | POA: Diagnosis not present

## 2021-05-31 DIAGNOSIS — Z85820 Personal history of malignant melanoma of skin: Secondary | ICD-10-CM | POA: Diagnosis not present

## 2021-05-31 DIAGNOSIS — L570 Actinic keratosis: Secondary | ICD-10-CM | POA: Diagnosis not present

## 2021-05-31 DIAGNOSIS — L821 Other seborrheic keratosis: Secondary | ICD-10-CM | POA: Diagnosis not present

## 2021-05-31 DIAGNOSIS — L2089 Other atopic dermatitis: Secondary | ICD-10-CM | POA: Diagnosis not present

## 2021-06-01 DIAGNOSIS — E222 Syndrome of inappropriate secretion of antidiuretic hormone: Secondary | ICD-10-CM | POA: Diagnosis not present

## 2021-06-01 DIAGNOSIS — E871 Hypo-osmolality and hyponatremia: Secondary | ICD-10-CM | POA: Diagnosis not present

## 2021-06-01 DIAGNOSIS — E785 Hyperlipidemia, unspecified: Secondary | ICD-10-CM | POA: Diagnosis not present

## 2021-06-01 DIAGNOSIS — E875 Hyperkalemia: Secondary | ICD-10-CM | POA: Diagnosis not present

## 2021-06-01 DIAGNOSIS — I10 Essential (primary) hypertension: Secondary | ICD-10-CM | POA: Diagnosis not present

## 2021-06-01 DIAGNOSIS — K219 Gastro-esophageal reflux disease without esophagitis: Secondary | ICD-10-CM | POA: Diagnosis not present

## 2021-06-05 DIAGNOSIS — E538 Deficiency of other specified B group vitamins: Secondary | ICD-10-CM | POA: Diagnosis not present

## 2021-06-05 DIAGNOSIS — F419 Anxiety disorder, unspecified: Secondary | ICD-10-CM | POA: Diagnosis not present

## 2021-06-05 DIAGNOSIS — U071 COVID-19: Secondary | ICD-10-CM | POA: Diagnosis not present

## 2021-06-05 DIAGNOSIS — K219 Gastro-esophageal reflux disease without esophagitis: Secondary | ICD-10-CM | POA: Diagnosis not present

## 2021-06-05 DIAGNOSIS — M47812 Spondylosis without myelopathy or radiculopathy, cervical region: Secondary | ICD-10-CM | POA: Diagnosis not present

## 2021-06-05 DIAGNOSIS — K5790 Diverticulosis of intestine, part unspecified, without perforation or abscess without bleeding: Secondary | ICD-10-CM | POA: Diagnosis not present

## 2021-06-05 DIAGNOSIS — E7800 Pure hypercholesterolemia, unspecified: Secondary | ICD-10-CM | POA: Diagnosis not present

## 2021-06-05 DIAGNOSIS — E871 Hypo-osmolality and hyponatremia: Secondary | ICD-10-CM | POA: Diagnosis not present

## 2021-06-05 DIAGNOSIS — Z791 Long term (current) use of non-steroidal anti-inflammatories (NSAID): Secondary | ICD-10-CM | POA: Diagnosis not present

## 2021-06-05 DIAGNOSIS — K589 Irritable bowel syndrome without diarrhea: Secondary | ICD-10-CM | POA: Diagnosis not present

## 2021-06-05 DIAGNOSIS — Z9181 History of falling: Secondary | ICD-10-CM | POA: Diagnosis not present

## 2021-06-05 DIAGNOSIS — Z7982 Long term (current) use of aspirin: Secondary | ICD-10-CM | POA: Diagnosis not present

## 2021-06-05 DIAGNOSIS — I10 Essential (primary) hypertension: Secondary | ICD-10-CM | POA: Diagnosis not present

## 2021-06-09 DIAGNOSIS — I10 Essential (primary) hypertension: Secondary | ICD-10-CM | POA: Diagnosis not present

## 2021-06-09 DIAGNOSIS — E222 Syndrome of inappropriate secretion of antidiuretic hormone: Secondary | ICD-10-CM | POA: Diagnosis not present

## 2021-06-09 DIAGNOSIS — E7800 Pure hypercholesterolemia, unspecified: Secondary | ICD-10-CM | POA: Diagnosis not present

## 2021-06-09 DIAGNOSIS — E875 Hyperkalemia: Secondary | ICD-10-CM | POA: Diagnosis not present

## 2021-06-09 DIAGNOSIS — E785 Hyperlipidemia, unspecified: Secondary | ICD-10-CM | POA: Diagnosis not present

## 2021-06-09 DIAGNOSIS — K219 Gastro-esophageal reflux disease without esophagitis: Secondary | ICD-10-CM | POA: Diagnosis not present

## 2021-06-09 DIAGNOSIS — U071 COVID-19: Secondary | ICD-10-CM | POA: Diagnosis not present

## 2021-06-09 DIAGNOSIS — E871 Hypo-osmolality and hyponatremia: Secondary | ICD-10-CM | POA: Diagnosis not present

## 2021-06-22 DIAGNOSIS — K219 Gastro-esophageal reflux disease without esophagitis: Secondary | ICD-10-CM | POA: Diagnosis not present

## 2021-06-22 DIAGNOSIS — E871 Hypo-osmolality and hyponatremia: Secondary | ICD-10-CM | POA: Diagnosis not present

## 2021-06-22 DIAGNOSIS — I10 Essential (primary) hypertension: Secondary | ICD-10-CM | POA: Diagnosis not present

## 2021-06-22 DIAGNOSIS — E7800 Pure hypercholesterolemia, unspecified: Secondary | ICD-10-CM | POA: Diagnosis not present

## 2021-06-22 DIAGNOSIS — U071 COVID-19: Secondary | ICD-10-CM | POA: Diagnosis not present

## 2021-06-28 DIAGNOSIS — H6123 Impacted cerumen, bilateral: Secondary | ICD-10-CM | POA: Diagnosis not present

## 2021-06-28 DIAGNOSIS — H902 Conductive hearing loss, unspecified: Secondary | ICD-10-CM | POA: Diagnosis not present

## 2021-06-29 DIAGNOSIS — E7800 Pure hypercholesterolemia, unspecified: Secondary | ICD-10-CM | POA: Diagnosis not present

## 2021-06-29 DIAGNOSIS — U071 COVID-19: Secondary | ICD-10-CM | POA: Diagnosis not present

## 2021-06-29 DIAGNOSIS — E871 Hypo-osmolality and hyponatremia: Secondary | ICD-10-CM | POA: Diagnosis not present

## 2021-06-29 DIAGNOSIS — K219 Gastro-esophageal reflux disease without esophagitis: Secondary | ICD-10-CM | POA: Diagnosis not present

## 2021-06-29 DIAGNOSIS — I10 Essential (primary) hypertension: Secondary | ICD-10-CM | POA: Diagnosis not present

## 2021-07-19 DIAGNOSIS — E559 Vitamin D deficiency, unspecified: Secondary | ICD-10-CM | POA: Diagnosis not present

## 2021-07-19 DIAGNOSIS — D126 Benign neoplasm of colon, unspecified: Secondary | ICD-10-CM | POA: Diagnosis not present

## 2021-07-19 DIAGNOSIS — E871 Hypo-osmolality and hyponatremia: Secondary | ICD-10-CM | POA: Diagnosis not present

## 2021-07-19 DIAGNOSIS — F419 Anxiety disorder, unspecified: Secondary | ICD-10-CM | POA: Diagnosis not present

## 2021-07-19 DIAGNOSIS — G4700 Insomnia, unspecified: Secondary | ICD-10-CM | POA: Diagnosis not present

## 2021-07-19 DIAGNOSIS — E222 Syndrome of inappropriate secretion of antidiuretic hormone: Secondary | ICD-10-CM | POA: Diagnosis not present

## 2021-07-19 DIAGNOSIS — M5416 Radiculopathy, lumbar region: Secondary | ICD-10-CM | POA: Diagnosis not present

## 2021-07-19 DIAGNOSIS — I6523 Occlusion and stenosis of bilateral carotid arteries: Secondary | ICD-10-CM | POA: Diagnosis not present

## 2021-07-19 DIAGNOSIS — C4330 Malignant melanoma of unspecified part of face: Secondary | ICD-10-CM | POA: Diagnosis not present

## 2021-07-19 DIAGNOSIS — E785 Hyperlipidemia, unspecified: Secondary | ICD-10-CM | POA: Diagnosis not present

## 2021-07-19 DIAGNOSIS — I10 Essential (primary) hypertension: Secondary | ICD-10-CM | POA: Diagnosis not present

## 2021-07-19 DIAGNOSIS — K589 Irritable bowel syndrome without diarrhea: Secondary | ICD-10-CM | POA: Diagnosis not present

## 2021-08-22 DIAGNOSIS — K642 Third degree hemorrhoids: Secondary | ICD-10-CM | POA: Diagnosis not present

## 2021-08-22 DIAGNOSIS — K6289 Other specified diseases of anus and rectum: Secondary | ICD-10-CM | POA: Diagnosis not present

## 2021-08-22 DIAGNOSIS — K625 Hemorrhage of anus and rectum: Secondary | ICD-10-CM | POA: Diagnosis not present

## 2021-08-26 DIAGNOSIS — D692 Other nonthrombocytopenic purpura: Secondary | ICD-10-CM | POA: Diagnosis not present

## 2021-08-26 DIAGNOSIS — Z85820 Personal history of malignant melanoma of skin: Secondary | ICD-10-CM | POA: Diagnosis not present

## 2021-08-26 DIAGNOSIS — L821 Other seborrheic keratosis: Secondary | ICD-10-CM | POA: Diagnosis not present

## 2021-08-26 DIAGNOSIS — L570 Actinic keratosis: Secondary | ICD-10-CM | POA: Diagnosis not present

## 2021-08-26 DIAGNOSIS — L814 Other melanin hyperpigmentation: Secondary | ICD-10-CM | POA: Diagnosis not present

## 2021-08-26 DIAGNOSIS — D485 Neoplasm of uncertain behavior of skin: Secondary | ICD-10-CM | POA: Diagnosis not present

## 2021-08-26 DIAGNOSIS — L820 Inflamed seborrheic keratosis: Secondary | ICD-10-CM | POA: Diagnosis not present

## 2021-08-26 DIAGNOSIS — Z85828 Personal history of other malignant neoplasm of skin: Secondary | ICD-10-CM | POA: Diagnosis not present

## 2021-09-26 DIAGNOSIS — H6123 Impacted cerumen, bilateral: Secondary | ICD-10-CM | POA: Diagnosis not present

## 2021-09-26 DIAGNOSIS — H902 Conductive hearing loss, unspecified: Secondary | ICD-10-CM | POA: Diagnosis not present

## 2021-10-07 ENCOUNTER — Other Ambulatory Visit: Payer: Self-pay | Admitting: Adult Health

## 2021-10-07 DIAGNOSIS — M5090 Cervical disc disorder, unspecified, unspecified cervical region: Secondary | ICD-10-CM

## 2021-10-07 DIAGNOSIS — L7211 Pilar cyst: Secondary | ICD-10-CM | POA: Diagnosis not present

## 2021-10-07 DIAGNOSIS — M79604 Pain in right leg: Secondary | ICD-10-CM | POA: Diagnosis not present

## 2021-10-07 DIAGNOSIS — R600 Localized edema: Secondary | ICD-10-CM | POA: Diagnosis not present

## 2021-10-07 DIAGNOSIS — L814 Other melanin hyperpigmentation: Secondary | ICD-10-CM | POA: Diagnosis not present

## 2021-10-07 DIAGNOSIS — M79602 Pain in left arm: Secondary | ICD-10-CM

## 2021-10-07 DIAGNOSIS — M5416 Radiculopathy, lumbar region: Secondary | ICD-10-CM | POA: Diagnosis not present

## 2021-10-07 DIAGNOSIS — M79605 Pain in left leg: Secondary | ICD-10-CM | POA: Diagnosis not present

## 2021-10-07 DIAGNOSIS — L570 Actinic keratosis: Secondary | ICD-10-CM | POA: Diagnosis not present

## 2021-10-07 DIAGNOSIS — Z85820 Personal history of malignant melanoma of skin: Secondary | ICD-10-CM | POA: Diagnosis not present

## 2021-10-07 DIAGNOSIS — M79601 Pain in right arm: Secondary | ICD-10-CM

## 2021-10-07 DIAGNOSIS — L821 Other seborrheic keratosis: Secondary | ICD-10-CM | POA: Diagnosis not present

## 2021-10-07 DIAGNOSIS — Z85828 Personal history of other malignant neoplasm of skin: Secondary | ICD-10-CM | POA: Diagnosis not present

## 2021-10-07 DIAGNOSIS — L905 Scar conditions and fibrosis of skin: Secondary | ICD-10-CM | POA: Diagnosis not present

## 2021-10-07 DIAGNOSIS — R202 Paresthesia of skin: Secondary | ICD-10-CM | POA: Diagnosis not present

## 2021-10-07 DIAGNOSIS — D485 Neoplasm of uncertain behavior of skin: Secondary | ICD-10-CM | POA: Diagnosis not present

## 2021-10-07 DIAGNOSIS — I10 Essential (primary) hypertension: Secondary | ICD-10-CM | POA: Diagnosis not present

## 2021-10-08 ENCOUNTER — Ambulatory Visit
Admission: RE | Admit: 2021-10-08 | Discharge: 2021-10-08 | Disposition: A | Discharge status: Home / Self Care | Payer: Medicare Other | Source: Ambulatory Visit | Attending: Adult Health | Admitting: Adult Health

## 2021-10-08 DIAGNOSIS — M47812 Spondylosis without myelopathy or radiculopathy, cervical region: Secondary | ICD-10-CM | POA: Diagnosis not present

## 2021-10-08 DIAGNOSIS — M5090 Cervical disc disorder, unspecified, unspecified cervical region: Secondary | ICD-10-CM

## 2021-10-08 DIAGNOSIS — R202 Paresthesia of skin: Secondary | ICD-10-CM

## 2021-10-08 DIAGNOSIS — M542 Cervicalgia: Secondary | ICD-10-CM | POA: Diagnosis not present

## 2021-10-08 DIAGNOSIS — M79601 Pain in right arm: Secondary | ICD-10-CM

## 2021-10-08 DIAGNOSIS — M79602 Pain in left arm: Secondary | ICD-10-CM

## 2021-10-08 DIAGNOSIS — M4802 Spinal stenosis, cervical region: Secondary | ICD-10-CM | POA: Diagnosis not present

## 2021-10-13 DIAGNOSIS — Z01419 Encounter for gynecological examination (general) (routine) without abnormal findings: Secondary | ICD-10-CM | POA: Diagnosis not present

## 2021-10-13 DIAGNOSIS — Z6828 Body mass index (BMI) 28.0-28.9, adult: Secondary | ICD-10-CM | POA: Diagnosis not present

## 2021-10-18 DIAGNOSIS — H43813 Vitreous degeneration, bilateral: Secondary | ICD-10-CM | POA: Diagnosis not present

## 2021-11-14 DIAGNOSIS — R410 Disorientation, unspecified: Secondary | ICD-10-CM | POA: Diagnosis not present

## 2021-11-14 DIAGNOSIS — M5090 Cervical disc disorder, unspecified, unspecified cervical region: Secondary | ICD-10-CM | POA: Diagnosis not present

## 2021-11-14 DIAGNOSIS — E871 Hypo-osmolality and hyponatremia: Secondary | ICD-10-CM | POA: Diagnosis not present

## 2021-11-14 DIAGNOSIS — I6523 Occlusion and stenosis of bilateral carotid arteries: Secondary | ICD-10-CM | POA: Diagnosis not present

## 2021-11-14 DIAGNOSIS — R519 Headache, unspecified: Secondary | ICD-10-CM | POA: Diagnosis not present

## 2021-11-14 DIAGNOSIS — I10 Essential (primary) hypertension: Secondary | ICD-10-CM | POA: Diagnosis not present

## 2021-11-15 ENCOUNTER — Encounter: Payer: Self-pay | Admitting: Physician Assistant

## 2021-11-15 ENCOUNTER — Ambulatory Visit: Payer: Medicare Other | Attending: Physician Assistant | Admitting: Physician Assistant

## 2021-11-15 ENCOUNTER — Telehealth: Payer: Self-pay | Admitting: Cardiovascular Disease

## 2021-11-15 VITALS — BP 130/88 | HR 59 | Ht 62.0 in | Wt 143.0 lb

## 2021-11-15 DIAGNOSIS — E785 Hyperlipidemia, unspecified: Secondary | ICD-10-CM | POA: Insufficient documentation

## 2021-11-15 DIAGNOSIS — I10 Essential (primary) hypertension: Secondary | ICD-10-CM | POA: Diagnosis not present

## 2021-11-15 MED ORDER — HYDRALAZINE HCL 10 MG PO TABS: 10.0000 mg | ORAL_TABLET | Freq: Two times a day (BID) | ORAL | 3 refills | Status: DC

## 2021-11-15 NOTE — Patient Instructions (Addendum)
Medication Instructions:  Your physician has recommended you make the following change in your medication:   START: Hydralazine '10mg'$  twice daily  *If you need a refill on your cardiac medications before your next appointment, please call your pharmacy*   Lab Work: None  If you have labs (blood work) drawn today and your tests are completely normal, you will receive your results only by: [place] (if you have MyChart) OR A paper copy in the mail If you have any lab test that is abnormal or we need to change your treatment, we will call you to review the results.   Follow-Up: At [facility], you and your health needs are our priority.  As part of our continuing mission to provide you with exceptional heart care, we have created designated Provider Care Teams.  These Care Teams include your primary Cardiologist (physician) and Advanced Practice Providers (APPs -  Physician Assistants and Nurse Practitioners) who all work together to provide you with the care you need, when you need it.  We recommend signing up for the patient portal called "MyChart".  Sign up information is provided on this After Visit Summary.  MyChart is used to connect with patients for Virtual Visits (Telemedicine).  Patients are able to view lab/test results, encounter notes, upcoming appointments, etc.  Non-urgent messages can be sent to your provider as well.   To learn more about what you can do with MyChart, go to [email or web address].    Your next appointment:   As scheduled  Other Instructions Two Gram Sodium Diet 2000 mg  What is Sodium? Sodium is a mineral found naturally in many foods. The most significant source of sodium in the diet is table salt, which is about 40% sodium.  Processed, convenience, and preserved foods also contain a large amount of sodium.  The body needs only 500 mg of sodium daily to function,  A normal diet provides more than enough sodium even if you do not use  salt.  Why Limit Sodium? A build up of sodium in the body can cause thirst, increased blood pressure, shortness of breath, and water retention.  Decreasing sodium in the diet can reduce edema and risk of heart attack or stroke associated with high blood pressure.  Keep in mind that there are many other factors involved in these health problems.  Heredity, obesity, lack of exercise, cigarette smoking, stress and what you eat all play a role.  General Guidelines: Do not add salt at the table or in cooking.  One teaspoon of salt contains over 2 grams of sodium. Read food labels Avoid processed and convenience foods Ask your dietitian before eating any foods not dicussed in the menu planning guidelines Consult your physician if you wish to use a salt substitute or a sodium containing medication such as antacids.  Limit milk and milk products to 16 oz (2 cups) per day.  Shopping Hints: READ LABELS!! "Dietetic" does not necessarily mean low sodium. Salt and other sodium ingredients are often added to foods during processing.    Menu Planning Guidelines Food Group Choose More Often Avoid  Beverages (see also the milk group All fruit juices, low-sodium, salt-free vegetables juices, low-sodium carbonated beverages Regular vegetable or tomato juices, commercially softened water used for drinking or cooking  Breads and Cereals Enriched white, wheat, rye and pumpernickel bread, hard rolls and dinner rolls; muffins, cornbread and waffles; most dry cereals, cooked cereal without added salt; unsalted crackers and breadsticks; low sodium or homemade bread crumbs  Bread, rolls and crackers with salted tops; quick breads; instant hot cereals; pancakes; commercial bread stuffing; self-rising flower and biscuit mixes; regular bread crumbs or cracker crumbs  Desserts and Sweets Desserts and sweets mad with mild should be within allowance Instant pudding mixes and cake mixes  Fats Butter or margarine; vegetable oils;  unsalted salad dressings, regular salad dressings limited to 1 Tbs; light, sour and heavy cream Regular salad dressings containing bacon fat, bacon bits, and salt pork; snack dips made with instant soup mixes or processed cheese; salted nuts  Fruits Most fresh, frozen and canned fruits Fruits processed with salt or sodium-containing ingredient (some dried fruits are processed with sodium sulfites        Vegetables Fresh, frozen vegetables and low- sodium canned vegetables Regular canned vegetables, sauerkraut, pickled vegetables, and others prepared in brine; frozen vegetables in sauces; vegetables seasoned with ham, bacon or salt pork  Condiments, Sauces, Miscellaneous  Salt substitute with physician's approval; pepper, herbs, spices; vinegar, lemon or lime juice; hot pepper sauce; garlic powder, onion powder, low sodium soy sauce (1 Tbs.); low sodium condiments (ketchup, chili sauce, mustard) in limited amounts (1 tsp.) fresh ground horseradish; unsalted tortilla chips, pretzels, potato chips, popcorn, salsa (1/4 cup) Any seasoning made with salt including garlic salt, celery salt, onion salt, and seasoned salt; sea salt, rock salt, kosher salt; meat tenderizers; monosodium glutamate; mustard, regular soy sauce, barbecue, sauce, chili sauce, teriyaki sauce, steak sauce, Worcestershire sauce, and most flavored vinegars; canned gravy and mixes; regular condiments; salted snack foods, olives, picles, relish, horseradish sauce, catsup   Food preparation: Try these seasonings Meats:    Pork Sage, onion Serve with applesauce  Chicken Poultry seasoning, thyme, parsley Serve with cranberry sauce  Lamb Curry powder, rosemary, garlic, thyme Serve with mint sauce or jelly  Veal Marjoram, basil Serve with current jelly, cranberry sauce  Beef Pepper, bay leaf Serve with dry mustard, unsalted chive butter  Fish [place] leaf, dill Serve with unsalted lemon butter, unsalted parsley butter  Vegetables:     Asparagus Lemon juice   Broccoli Lemon juice   Carrots Mustard dressing parsley, mint, nutmeg, glazed with unsalted butter and sugar   Green beans Marjoram, lemon juice, nutmeg,dill seed   Tomatoes Basil, marjoram, onion   Spice /blend for [employer]" 4 tsp ground thyme 1 tsp ground sage 3 tsp ground rosemary 4 tsp ground marjoram   Test your knowledge A product that says "Salt Free" may still contain sodium. True or False Garlic Powder and Hot Pepper Sauce an be used as alternative seasonings.True or False Processed foods have more sodium than fresh foods.  True or False Canned Vegetables have less sodium than froze True or False   WAYS TO DECREASE YOUR SODIUM INTAKE Avoid the use of added salt in cooking and at the table.  Table salt (and other prepared seasonings which contain salt) is probably one of the greatest sources of sodium in the diet.  Unsalted foods can gain flavor from the sweet, sour, and butter taste sensations of herbs and spices.  Instead of using salt for seasoning, try the following seasonings with the foods listed.  Remember: how you use them to enhance natural food flavors is limited only by your creativity... Allspice-Meat, fish, eggs, fruit, peas, red and yellow vegetables Almond Extract-Fruit baked goods Anise Seed-Sweet breads, fruit, carrots, beets, cottage cheese, cookies (tastes like licorice) Basil-Meat, fish, eggs, vegetables, rice, vegetables salads, soups, sauces Bay Leaf-Meat, fish, stews, poultry Burnet-Salad, vegetables (cucumber-like flavor) Caraway  Seed-Bread, cookies, cottage cheese, meat, vegetables, cheese, rice Cardamon-Baked goods, fruit, soups Celery Powder or seed-Salads, salad dressings, sauces, meatloaf, soup, bread.Do not use  celery salt Chervil-Meats, salads, fish, eggs, vegetables, cottage cheese (parsley-like flavor) Chili Power-Meatloaf, chicken cheese, corn, eggplant, egg dishes Chives-Salads cottage cheese, egg dishes, soups,  vegetables, sauces Cilantro-Salsa, casseroles Cinnamon-Baked goods, fruit, pork, lamb, chicken, carrots Cloves-Fruit, baked goods, fish, pot roast, green beans, beets, carrots Coriander-Pastry, cookies, meat, salads, cheese (lemon-orange flavor) Cumin-Meatloaf, fish,cheese, eggs, cabbage,fruit pie (caraway flavor) [employer], fruit, eggs, fish, poultry, cottage cheese, vegetables Dill Seed-Meat, cottage cheese, poultry, vegetables, fish, salads, bread Fennel Seed-Bread, cookies, apples, pork, eggs, fish, beets, cabbage, cheese, Licorice-like flavor Garlic-(buds or powder) Salads, meat, poultry, fish, bread, butter, vegetables, potatoes.Do not  use garlic salt Ginger-Fruit, vegetables, baked goods, meat, fish, poultry Horseradish Root-Meet, vegetables, butter Lemon Juice or Extract-Vegetables, fruit, tea, baked goods, fish salads Mace-Baked goods fruit, vegetables, fish, poultry (taste like nutmeg) Maple Extract-Syrups Marjoram-Meat, chicken, fish, vegetables, breads, green salads (taste like Sage) Mint-Tea, lamb, sherbet, vegetables, desserts, carrots, cabbage Mustard, Dry or Seed-Cheese, eggs, meats, vegetables, poultry Nutmeg-Baked goods, fruit, chicken, eggs, vegetables, desserts Onion Powder-Meat, fish, poultry, vegetables, cheese, eggs, bread, rice salads (Do not use   Onion salt) Orange Extract-Desserts, baked goods Oregano-Pasta, eggs, cheese, onions, pork, lamb, fish, chicken, vegetables, green salads Paprika-Meat, fish, poultry, eggs, cheese, vegetables Parsley Flakes-Butter, vegetables, meat fish, poultry, eggs, bread, salads (certain forms may   Contain sodium Pepper-Meat fish, poultry, vegetables, eggs Peppermint Extract-Desserts, baked goods Poppy Seed-Eggs, bread, cheese, fruit dressings, baked goods, noodles, vegetables, cottage  [employer], poultry, meat, fish, cauliflower, turnips,eggs bread Saffron-Rice, bread, veal,  chicken, fish, eggs Sage-Meat, fish, poultry, onions, eggplant, tomateos, pork, stews Savory-Eggs, salads, poultry, meat, rice, vegetables, soups, pork Tarragon-Meat, poultry, fish, eggs, butter, vegetables (licorice-like flavor)  Thyme-Meat, poultry, fish, eggs, vegetables, (clover-like flavor), sauces, soups Tumeric-Salads, butter, eggs, fish, rice, vegetables (saffron-like flavor) Vanilla Extract-Baked goods, candy Vinegar-Salads, vegetables, meat marinades Walnut Extract-baked goods, candy   2. Choose your Foods Wisely   The following is a list of foods to avoid which are high in sodium:  Meats-Avoid all smoked, canned, salt cured, dried and kosher meat and fish as well as Anchovies   [employer] meats:Bologna, Liverwurst, Pastrami Canned meat or fish  Marinated herring Caviar    Pepperoni Corned Beef   Pizza Dried chipped beef  Salami Frozen breaded fish or meat Salt pork Frankfurters or hot dogs  Sardines Gefilte fish   Sausage Ham (boiled ham, Proscuitto Smoked butt    spiced ham)   Spam      TV Dinners Vegetables Canned vegetables (Regular) Relish Canned mushrooms  Sauerkraut Olives    Tomato juice Pickles  Bakery and Dessert Products Canned puddings  Cream pies Cheesecake   Decorated cakes Cookies  Beverages/Juices Tomato juice, regular  Gatorade   V-8 vegetable juice, regular  Breads and Cereals Biscuit mixes   Salted potato chips, corn chips, pretzels Bread stuffing mixes  Salted crackers and rolls Pancake and waffle mixes Self-rising flour  Seasonings Accent    Meat sauces Barbecue sauce  Meat tenderizer Catsup    Monosodium glutamate (MSG) Celery salt   Onion salt Chili sauce   Prepared mustard Garlic salt   Salt, seasoned salt, sea salt Gravy mixes   Soy sauce Horseradish   Steak sauce Ketchup   Tartar sauce Lite salt    Teriyaki sauce Marinade mixes   Worcestershire sauce  Others Baking  powder   Cocoa and cocoa mixes Baking  soda   Commercial casserole mixes Candy-caramels, chocolate  Dehydrated soups    Bars, fudge,nougats  Instant rice and pasta mixes Canned broth or soup  Maraschino cherries Cheese, aged and processed cheese and cheese spreads  Learning Assessment Quiz  Indicated T (for True) or F (for False) for each of the following statements:  _____ Fresh fruits and vegetables and unprocessed grains are generally low in sodium _____ Water may contain a considerable amount of sodium, depending on the source _____ You can always tell if a food is high in sodium by tasting it _____ Certain laxatives my be high in sodium and should be avoided unless prescribed   by a physician or pharmacist _____ Salt substitutes may be used freely by anyone on a sodium restricted diet _____ Sodium is present in table salt, food additives and as a natural component of   most foods _____ Table salt is approximately 90% sodium _____ Limiting sodium intake may help prevent excess fluid accumulation in the body _____ On a sodium-restricted diet, seasonings such as bouillon soy sauce, and    cooking wine should be used in place of table salt _____ On an ingredient list, a product which lists monosodium glutamate as the first   ingredient is an appropriate food to include on a low sodium diet  Circle the best answer(s) to the following statements (Hint: there may be more than one correct answer)  11. On a low-sodium diet, some acceptable snack items are:    A. Olives  F. Bean dip   K. Grapefruit juice    B. Salted Pretzels G. Commercial Popcorn   L. Canned peaches    C. Carrot Sticks  H. Bouillon   M. Unsalted nuts   D. [place] fries  I. Peanut butter crackers N. Salami   E. Sweet pickles J. Tomato Juice   O. Pizza  12.  Seasonings that may be used freely on a reduced - sodium diet include   A. Lemon wedges F.Monosodium glutamate K. Celery seed    B.Soysauce   G. Pepper   L. Mustard powder   C. Sea salt  H. Cooking  wine  M. Onion flakes   D. Vinegar  E. Prepared horseradish N. Salsa   E. Sage   J. Worcestershire sauce  O. Chutney

## 2021-11-15 NOTE — Addendum Note (Signed)
Addended by: Carylon Perches on: 11/15/2021 11:55 AM   Modules accepted: Orders

## 2021-11-15 NOTE — Telephone Encounter (Signed)
Pt has been checking BP daily since last week because she didn't feel right.  170/105.   Contacted PCP and instructed to double metoprolol.  She on her own added spironolactone back because fingers and feet felt tight.  Last Friday 190/110.  Saw APP at Dr. Baldwin Crown office yesterday.  116/60 there, home reading was 150s/90s/  They had her cut the metoprolol back down to 25 mg daily.      Went back up into the evening and started feeling bad again.  This am 170/105.  HR 54.  Requesting to be seen in cardiology.  Scheduled her today w APP and asked he to bring her home BP cuff w her to compare.  Her head and ears feel full w higher pressures.  Has had occas headaches w high readings.  No reports of dizziness/lightheadedness/SOB.   Some time ago when she had covid her sodium was low so they took her off spironolactone and added amlodipine.  Retained fluid on that so that was stopped. At that point they increased losartan to 100 mg daily from 50 mg daily.

## 2021-11-15 NOTE — Progress Notes (Signed)
Cardiology Office Note:    Date:  11/15/2021   ID:  Wendy [name], DOB 09/17/1945, MRN [id number]  PCP:  Reynold Bowen, [place] [facility] Cardiologist:  [name], MD     Referring MD: Reynold Bowen, MD   Chief Complaint:  Hypertension     History of Present Illness:   Wendy [name] is a 76 y.o. female with HTN, HLD, mild carotid disease.  Patient added onto my schedule because home BP's running 170/105.  PCP doubled metoprolol. Yest at Dr. Baldwin Crown office BP 116/60 and they decreased metoprolol back down to 25 mg daily. BP went back up last night. [name] stopped b/c of low sodium in the past and put on amlodipine but stopped due to edema. Losartan increased 100 mg daily.     She was at the beach last week and had headaches and BP 180/110. She took spironolactone over the weekend and sodium dropped 139 to 136 on labs yest. Did get extra salt in her diet at the beach. Is careful with cooking. Walks 2-3 miles 2-3 times a week.    Past Medical History:  Diagnosis Date   Anxiety    Atypical chest pain    B12 deficiency    Cervical spondylosis without myelopathy    Diverticulosis of colon    Dysesthesia    GERD (gastroesophageal reflux disease)    Hypercholesteremia    Hypertension    IBS (irritable bowel syndrome)    Malignant melanoma (HCC)    Vitreous hemorrhage (HCC)    Current Medications: Current Meds  Medication Sig   ascorbic acid (VITAMIN C) 500 MG tablet Take 1 tablet (500 mg total) by mouth daily.   aspirin 81 MG tablet Take 81 mg by mouth daily.   b complex vitamins tablet Take 1 tablet by mouth daily.   Cholecalciferol (VITAMIN D) 2000 UNITS CAPS Take 1 capsule by mouth daily.   Coenzyme Q10 (COQ10) 200 MG CAPS Take 1 capsule by mouth daily.   DIGEST ENZYMES-ANTICHOLINERGIC PO Take 1 capsule by mouth 3 (three) times daily.    hydrALAZINE (APRESOLINE) 10 MG tablet Take 1 tablet (10 mg total) by mouth 2 (two) times daily.   ibuprofen  (ADVIL,MOTRIN) 200 MG tablet Take 200 mg by mouth every 6 (six) hours as needed for moderate pain.    losartan (COZAAR) 50 MG tablet Take 50 mg by mouth in the morning and at bedtime.   metoprolol succinate (TOPROL-XL) 25 MG 24 hr tablet Take 25 mg by mouth daily.   Misc Natural Products (TART CHERRY ADVANCED) CAPS Take 2 capsules by mouth daily.   Multiple Vitamins-Minerals (MULTIVITAMIN & MINERAL PO) Take 1 tablet by mouth daily.   Omega-3 Fatty Acids (FISH OIL PO) Take 1 capsule by mouth daily.   Red Yeast Rice 600 MG CAPS Take 2 capsules by mouth daily.   rosuvastatin (CRESTOR) 5 MG tablet Take 5 mg by mouth once a week.   sodium chloride 1 g tablet Take 2 tablets (2 g total) by mouth 2 (two) times daily with a meal.   zinc sulfate 220 (50 Zn) MG capsule Take 1 capsule (220 mg total) by mouth daily.    Allergies:   Hctz [hydrochlorothiazide], Codeine, Hydrocodone-acetaminophen, Lisinopril, Neomycin, Other, Oxycodone, Pravastatin sodium, and Ciprofloxacin   Social History   Tobacco Use   Smoking status: Never   Smokeless tobacco: Never  Substance Use Topics   Alcohol use: No   Drug use: No    Family  Hx: The patient's family history includes Cancer in her brother and father; Deep vein thrombosis in her father; Diabetes in her mother; Heart attack in her father; Heart disease in her mother; Hyperlipidemia in her mother; Hypertension in her mother.  ROS   EKGs/Labs/Other Test Reviewed:    EKG:  EKG is  not ordered today.     Recent Labs: 05/01/2021: TSH 1.464 05/02/2021: Magnesium 2.2 05/03/2021: ALT 24; BUN 10; Creatinine, Ser 0.79; Hemoglobin 11.1; Platelets 369; Potassium 3.3; Sodium 125   Recent Lipid Panel No results for input(s): "CHOL", "TRIG", "HDL", "VLDL", "LDLCALC", "LDLDIRECT" in the last 8760 hours.   Prior CV Studies:     Carotid dopplers 07/2019 Summary:  Right Carotid: Velocities in the right ICA are consistent with a 1-39%  stenosis.   Left Carotid: Velocities  in the left ICA are consistent with a 1-39%  stenosis.   Vertebrals: Bilateral vertebral arteries demonstrate antegrade flow.  Subclavians: Right subclavian artery was stenotic. Right subclavian artery  flow              was disturbed. Normal flow hemodynamics were seen in the left               subclavian artery.   *See table(s) above for measurements and observations.      Risk Assessment/Calculations/Metrics:              Physical Exam:    VS:  BP 130/88   Pulse (!) 59   Ht '5\' 2"'$  (1.575 m)   Wt 143 lb (64.9 kg)   SpO2 97%   BMI 26.16 kg/m     Wt Readings from Last 3 Encounters:  11/15/21 143 lb (64.9 kg)  04/30/21 140 lb (63.5 kg)  01/12/21 144 lb 12.8 oz (65.7 kg)    Physical Exam  GEN: Well nourished, well developed, in no acute distress  Neck: no JVD, carotid bruits, or masses Cardiac:RRR; no murmurs, rubs, or gallops  Respiratory:  clear to auscultation bilaterally, normal work of breathing GI: soft, nontender, nondistended, + BS Ext: without cyanosis, clubbing, or edema, Good distal pulses bilaterally Neuro:  Alert and Oriented x 3,  Psych: euthymic mood, full affect       ASSESSMENT & PLAN:   No problem-specific Assessment & Plan notes found for this encounter.        HTN-BP up and down and her cuff isn't consistent with ours-checked 4 times here. Hyponatremia on spironolactone and HCTZ in past, swelling on amlodipine.  Will try low dose hydralazine and see how she does.   HLD on Crestor managed by PCP     Dispo:  No follow-ups on file.   Medication Adjustments/Labs and Tests Ordered: Current medicines are reviewed at length with the patient today.  Concerns regarding medicines are outlined above.  Tests Ordered: Orders Placed This Encounter  Procedures   Basic metabolic panel   Medication Changes: Meds ordered this encounter  Medications   hydrALAZINE (APRESOLINE) 10 MG tablet    Sig: Take 1 tablet (10 mg total) by mouth 2 (two) times  daily.    Dispense:  180 tablet    Refill:  3   Signed, Ermalinda Barrios, PA-C  11/15/2021 11:49 AM    [address], [place], [place]  [address] Phone: [phone]; Fax: [phone]

## 2021-11-16 ENCOUNTER — Other Ambulatory Visit: Payer: Self-pay | Admitting: Adult Health

## 2021-11-16 DIAGNOSIS — R410 Disorientation, unspecified: Secondary | ICD-10-CM

## 2021-11-16 DIAGNOSIS — I10 Essential (primary) hypertension: Secondary | ICD-10-CM

## 2021-11-16 DIAGNOSIS — R519 Headache, unspecified: Secondary | ICD-10-CM

## 2021-11-16 DIAGNOSIS — I6523 Occlusion and stenosis of bilateral carotid arteries: Secondary | ICD-10-CM

## 2021-11-17 ENCOUNTER — Ambulatory Visit
Admission: RE | Admit: 2021-11-17 | Discharge: 2021-11-17 | Disposition: A | Discharge status: Home / Self Care | Payer: Medicare Other | Source: Ambulatory Visit | Attending: Adult Health | Admitting: Adult Health

## 2021-11-17 ENCOUNTER — Other Ambulatory Visit: Payer: Self-pay | Admitting: Adult Health

## 2021-11-17 DIAGNOSIS — I6523 Occlusion and stenosis of bilateral carotid arteries: Secondary | ICD-10-CM

## 2021-11-17 DIAGNOSIS — I10 Essential (primary) hypertension: Secondary | ICD-10-CM

## 2021-11-17 DIAGNOSIS — R42 Dizziness and giddiness: Secondary | ICD-10-CM | POA: Diagnosis not present

## 2021-11-17 DIAGNOSIS — R519 Headache, unspecified: Secondary | ICD-10-CM

## 2021-11-17 DIAGNOSIS — I6621 Occlusion and stenosis of right posterior cerebral artery: Secondary | ICD-10-CM | POA: Diagnosis not present

## 2021-11-17 DIAGNOSIS — R410 Disorientation, unspecified: Secondary | ICD-10-CM

## 2021-11-17 MED ORDER — IOPAMIDOL (ISOVUE-370) INJECTION 76%
75.0000 mL | Freq: Once | INTRAVENOUS | Status: AC | PRN
  Administered 2021-11-17: 75 mL via INTRAVENOUS

## 2021-11-24 DIAGNOSIS — I10 Essential (primary) hypertension: Secondary | ICD-10-CM | POA: Diagnosis not present

## 2021-11-28 DIAGNOSIS — I10 Essential (primary) hypertension: Secondary | ICD-10-CM | POA: Diagnosis not present

## 2021-11-29 ENCOUNTER — Ambulatory Visit: Payer: Medicare Other | Admitting: Physician Assistant

## 2021-11-29 DIAGNOSIS — H902 Conductive hearing loss, unspecified: Secondary | ICD-10-CM | POA: Diagnosis not present

## 2021-11-29 DIAGNOSIS — H6123 Impacted cerumen, bilateral: Secondary | ICD-10-CM | POA: Diagnosis not present

## 2021-11-29 DIAGNOSIS — H60549 Acute eczematoid otitis externa, unspecified ear: Secondary | ICD-10-CM | POA: Diagnosis not present

## 2021-11-30 ENCOUNTER — Ambulatory Visit: Payer: Medicare Other | Admitting: Physician Assistant

## 2021-12-05 ENCOUNTER — Other Ambulatory Visit: Payer: Self-pay | Admitting: Endocrinology

## 2021-12-05 DIAGNOSIS — E785 Hyperlipidemia, unspecified: Secondary | ICD-10-CM

## 2021-12-08 DIAGNOSIS — Z85820 Personal history of malignant melanoma of skin: Secondary | ICD-10-CM | POA: Diagnosis not present

## 2021-12-08 DIAGNOSIS — D1801 Hemangioma of skin and subcutaneous tissue: Secondary | ICD-10-CM | POA: Diagnosis not present

## 2021-12-08 DIAGNOSIS — D225 Melanocytic nevi of trunk: Secondary | ICD-10-CM | POA: Diagnosis not present

## 2021-12-08 DIAGNOSIS — M542 Cervicalgia: Secondary | ICD-10-CM | POA: Diagnosis not present

## 2021-12-08 DIAGNOSIS — I10 Essential (primary) hypertension: Secondary | ICD-10-CM | POA: Diagnosis not present

## 2021-12-08 DIAGNOSIS — D2261 Melanocytic nevi of right upper limb, including shoulder: Secondary | ICD-10-CM | POA: Diagnosis not present

## 2021-12-08 DIAGNOSIS — L814 Other melanin hyperpigmentation: Secondary | ICD-10-CM | POA: Diagnosis not present

## 2021-12-08 DIAGNOSIS — D692 Other nonthrombocytopenic purpura: Secondary | ICD-10-CM | POA: Diagnosis not present

## 2021-12-08 DIAGNOSIS — L821 Other seborrheic keratosis: Secondary | ICD-10-CM | POA: Diagnosis not present

## 2021-12-08 DIAGNOSIS — L570 Actinic keratosis: Secondary | ICD-10-CM | POA: Diagnosis not present

## 2021-12-08 DIAGNOSIS — R350 Frequency of micturition: Secondary | ICD-10-CM | POA: Diagnosis not present

## 2021-12-08 DIAGNOSIS — F419 Anxiety disorder, unspecified: Secondary | ICD-10-CM | POA: Diagnosis not present

## 2021-12-08 DIAGNOSIS — R519 Headache, unspecified: Secondary | ICD-10-CM | POA: Diagnosis not present

## 2021-12-08 DIAGNOSIS — Z85828 Personal history of other malignant neoplasm of skin: Secondary | ICD-10-CM | POA: Diagnosis not present

## 2021-12-12 ENCOUNTER — Ambulatory Visit
Admission: RE | Admit: 2021-12-12 | Discharge: 2021-12-12 | Disposition: A | Discharge status: Home / Self Care | Payer: No Typology Code available for payment source | Source: Ambulatory Visit | Attending: Endocrinology | Admitting: Endocrinology

## 2021-12-12 DIAGNOSIS — I700 Atherosclerosis of aorta: Secondary | ICD-10-CM | POA: Diagnosis not present

## 2021-12-12 DIAGNOSIS — E785 Hyperlipidemia, unspecified: Secondary | ICD-10-CM

## 2021-12-15 ENCOUNTER — Telehealth: Payer: Self-pay | Admitting: Cardiovascular Disease

## 2021-12-15 NOTE — Telephone Encounter (Signed)
Pt c/o BP issue: STAT if pt c/o blurred vision, one-sided weakness or slurred speech  1. What are your last 5 BP readings?     This morning 159/95 Yesterday 160/100  2. Are you having any other symptoms (ex. Dizziness, headache, blurred vision, passed out)?   No  3. What is your BP issue?   Patient stated she has been working on trying to get her BP under control.  Patient would like a callback after 2:15 pm

## 2021-12-15 NOTE — Telephone Encounter (Signed)
Patient was concerned about her BP and what she should be taking. Patient saw Ermalinda Barrios PA on 11/15/21 and she had patient stop spironolactone 25 and start hydralazine 10 mg BID.  At the time patient was on Losartan 50 mg and metoprolol succinate 25 mg. Patient never started hydralazine, because her PCP was trying to do testing and see what her BP was without changes, therefore patient never started hydralazine. Mean while PCP told patient to increase her losartan to 100 mg, change her metoprolol to coreg 6.25 mg BID, start amlodipine, and later added spirolactone 12.5 mg at patient's request for dieretic. Patient stated her BP is still high and she did  not think coreg made much of a difference so she went back to taking metoprolol.  Patient stop amlodipine on her own. Told patient that she has too many providers trying to control her BP medications and she needs to just stick to one provider and do what they recommend to see if that helps. Patient stated she wants to go with Dr. Elmarie Shiley advisement. Informed patient that she could go back to what she was taking when she saw Ermalinda Barrios, PA  and  start hydralazine. Patient stated she could do that. Patient's current BP 162/101 HR 68. So right now patient only took her metoprolol 25 mg  and losartan 100 mg  this morning, so told patient she could try the hydralazine to see if that helps her BP go down. Encouraged patient to check her BP 2 hours after taking hydralazine to see if it had an effect. Will send Dr. Acie Fredrickson message to see if he has any further advisement. Patient has follow-up with Dr. Acie Fredrickson in November. Spent over an hour on the phone with patient.

## 2021-12-16 NOTE — Telephone Encounter (Signed)
Patient would like to follow-up on discussion of medication change.

## 2021-12-16 NOTE — Telephone Encounter (Signed)
Returned call to patient who wanted to ask about Crestor/Rosuvastatin. She wanted to know about dietary restrictions with this drug and if it was okay for her to take. Advised her to avoid grapefruit juice, avoid magnesium -containing supplements within 2 hours of taking it and that yes, it's safe to take and recommended. Will se MD in office this upcoming week to review HTN medication regimen.

## 2021-12-18 ENCOUNTER — Encounter: Payer: Self-pay | Admitting: Cardiovascular Disease

## 2021-12-18 NOTE — Progress Notes (Unsigned)
Wendy [name] Date of Birth  August 21, 1945       [facility] [address], [address], [place], [place]  [address]   [place], [place]  [address] [address]   Fax  [phone]     Fax [phone]  Problem List:  1. Mild carotid artery disease 2  Essential HTN; 3. Hyperlipidemia   Previous notes:   Wendy [name] is a 76 yo who I have seen in the past for elevated cholesterol for years.  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  She was told to see a cardiologist  She walks 4-5 times a week ( 3 miles at a time)  without chest pain or shortness breath. She denies any syncope or presyncopal episodes.   She was seen in consultation by Wendy [name] and her carotid duplex was negative for obstructive disease.  She has mild bilateral carotid artery disease. She has also seen Dr . Wendy [name].   She works part time for [employer].   Dec. 19, 2016: Had a hysterectomy and bladder tuck at [facility].  Had some hyponatremia -  Called in a cardiology consult.    Did an echo which was normal  Doing better now.   BP spiked in Nov.  Went to the ER .    was taken off [place].   Was started on amlodipine / valsartan 2.5 / 80 a day  Feb. 2, 2018:  Wendy [name] is seen back for her her yearly visit .   Not exercising much .     Jul 23, 2019:  Wendy [name] is seen today after 3-year absence.    Has gained weight in covid.  No CP or dyspnea  Exercises - walks 3-4 miles a day  Has had some plantar fascitis .   Nov. 16, 2022 Wendy [name] is seen for follow up of her HLD, HTN, mild carotid artery disease. Feeling well    Oct. 23, 2023 Wendy [name] is seen today for follow up of her HTN, HLD, mild carotid artery disease   She was on Spironolactone but this was stopped due to hyponatremia .  She reduced her water intake and the sodium levels improved.   She also had a  coronary calcium score . Total calcium score of 103 is at percentile 58 for subjects of the same age, gender, and race/ethnicity  Has had difficulty with BP control.   She is on low-dose rosuvastatin but does not tolerate any higher dose.  She has significant myalgias with rosuvastatin and pravastatin.  I suspect that she has been tried on numerous other statins.  She has coronary calcifications and has mild carotid artery disease.  Her LDL goal is 55.  Her current LDL is 120.  We will refer her to the lipid clinic for consideration of a PCSK9 inhibitor.    Current Outpatient Medications on File Prior to Visit  Medication Sig Dispense Refill   ALPRAZolam (XANAX) 0.25 MG tablet Take 1 tablet by mouth daily as needed.     ascorbic acid (VITAMIN C) 500 MG tablet Take 1 tablet (500 mg total) by mouth daily. 90 tablet 0   b complex vitamins tablet Take 1 tablet by mouth daily.     Cholecalciferol (VITAMIN D) 2000 UNITS CAPS Take 1 capsule by mouth daily.     Coenzyme Q10 (COQ10)  200 MG CAPS Take 1 capsule by mouth daily.     cyclobenzaprine (FLEXERIL) 10 MG tablet Take 10 mg by mouth daily as needed.     DIGEST ENZYMES-ANTICHOLINERGIC PO Take 1 capsule by mouth 3 (three) times daily. PRN     hydrALAZINE (APRESOLINE) 10 MG tablet Take 1 tablet (10 mg total) by mouth 2 (two) times daily. 180 tablet 3   losartan (COZAAR) 100 MG tablet Take 100 mg by mouth daily.     metoprolol succinate (TOPROL-XL) 25 MG 24 hr tablet Take 25 mg by mouth daily.     Misc Natural Products (TART CHERRY ADVANCED) CAPS Take 2 capsules by mouth daily.     Multiple Vitamins-Minerals (MULTIVITAMIN & MINERAL PO) Take 1 tablet by mouth daily.     Omega-3 Fatty Acids (FISH OIL PO) Take 1 capsule by mouth daily.     Red Yeast Rice 600 MG CAPS Take 2 capsules by mouth daily.     rosuvastatin (CRESTOR) 5 MG tablet Take 5 mg by mouth once a week.     zinc sulfate 220 (50 Zn) MG capsule Take 1 capsule by mouth daily.      aspirin 81 MG tablet Take 81 mg by mouth daily.     ibuprofen (ADVIL,MOTRIN) 200 MG tablet Take 200 mg by mouth every 6 (six) hours as needed for moderate pain.  (Patient not taking: Reported on 12/19/2021)     No current facility-administered medications on file prior to visit.    Allergies  Allergen Reactions   Hctz [Hydrochlorothiazide] Other (See Comments)    hyponatremia   Codeine Nausea Only and Nausea And Vomiting   Hydrocodone-Acetaminophen Nausea And Vomiting   Lisinopril Other (See Comments)    REACTION: INTOL to Prinivil w/ pain on urination    Neomycin Dermatitis   Other Nausea Only    fragrance   Oxycodone Nausea And Vomiting   Pravastatin Sodium Other (See Comments)    REACTION: INTOL to Pravachol w/ myalgias   Ciprofloxacin Other (See Comments)    Past Medical History:  Diagnosis Date   Anxiety    Atypical chest pain    B12 deficiency    Cervical spondylosis without myelopathy    Diverticulosis of colon    Dysesthesia    GERD (gastroesophageal reflux disease)    Hypercholesteremia    Hypertension    IBS (irritable bowel syndrome)    Malignant melanoma (HCC)    Vitreous hemorrhage (HCC)     Past Surgical History:  Procedure Laterality Date   anterior cervical discectomy and fusion  1996   Dr. Sherwood [name]   arthroscopic shoulder surgery     CESAREAN SECTION     melanoma surgery from ant neck region  1995   skin cancer removed from nose      Social History   Tobacco Use  Smoking Status Never  Smokeless Tobacco Never    Social History   Substance and Sexual Activity  Alcohol Use No    Family History  Problem Relation Age of Onset   Diabetes Mother    Heart disease Mother    Hyperlipidemia Mother    Hypertension Mother    Cancer Father    Deep vein thrombosis Father    Heart attack Father    Cancer Brother     Reviw of Systems:  Reviewed in the HPI.  All other systems are negative.  Physical Exam: Blood pressure 128/80, pulse 62,  height 5' (1.524 m), weight 144 lb 3.2 oz (65.4  kg), SpO2 97 %.       GEN:  Well nourished, well developed in no acute distress HEENT: Normal NECK: No JVD; No carotid bruits LYMPHATICS: No lymphadenopathy CARDIAC: RRR , no murmurs, rubs, gallops RESPIRATORY:  Clear to auscultation without rales, wheezing or rhonchi  ABDOMEN: Soft, non-tender, non-distended MUSCULOSKELETAL:  No edema; No deformity  SKIN: Warm and dry NEUROLOGIC:  Alert and oriented x 3   ECG:    Assessment / Plan:   1. Mild carotid artery disease-          2  Essential HTN:     recheck of he BP - looks ok  Encouraged her to work on some weight loss, increasing exercise, reducing the salt in her diet.  I will see her again in 6 months for a follow-up visit.   3. Hyperlipidemia-she is tried taking her rosuvastatin daily but it is causing lots of muscle aches.  She really does not tolerate statins at all.  We will can we will refer her to the lipid clinic for consideration of a PCSK9 inhibitor.  4.  Coronary artery calcifications:   her Goal LDL is 55.  She does not tolerate statins Will refer her to lipid clinic for considertion of PCSK9 inhibitor     [name], MD  12/19/2021 12:14 PM    [facility] [address],  [place], [place]  [address] Pager [phone] Phone: [phone]; Fax: [phone]

## 2021-12-19 ENCOUNTER — Ambulatory Visit: Payer: Medicare Other | Attending: Cardiovascular Disease | Admitting: Cardiovascular Disease

## 2021-12-19 ENCOUNTER — Encounter: Payer: Self-pay | Admitting: Cardiovascular Disease

## 2021-12-19 VITALS — BP 128/80 | HR 62 | Ht 60.0 in | Wt 144.2 lb

## 2021-12-19 DIAGNOSIS — E782 Mixed hyperlipidemia: Secondary | ICD-10-CM | POA: Diagnosis not present

## 2021-12-19 DIAGNOSIS — I10 Essential (primary) hypertension: Secondary | ICD-10-CM | POA: Diagnosis not present

## 2021-12-19 DIAGNOSIS — I6523 Occlusion and stenosis of bilateral carotid arteries: Secondary | ICD-10-CM | POA: Diagnosis not present

## 2021-12-19 NOTE — Patient Instructions (Signed)
Medication Instructions:  Your physician recommends that you continue on your current medications as directed. Please refer to the Current Medication list given to you today.  *If you need a refill on your cardiac medications before your next appointment, please call your pharmacy*   Lab Work: NONE If you have labs (blood work) drawn today and your tests are completely normal, you will receive your results only by: [place] (if you have MyChart) OR A paper copy in the mail If you have any lab test that is abnormal or we need to change your treatment, we will call you to review the results.   Testing/Procedures: Referral to Lipid Clinic   Follow-Up: At [facility], you and your health needs are our priority.  As part of our continuing mission to provide you with exceptional heart care, we have created designated Provider Care Teams.  These Care Teams include your primary Cardiologist (physician) and Advanced Practice Providers (APPs -  Physician Assistants and Nurse Practitioners) who all work together to provide you with the care you need, when you need it.  We recommend signing up for the patient portal called "MyChart".  Sign up information is provided on this After Visit Summary.  MyChart is used to connect with patients for Virtual Visits (Telemedicine).  Patients are able to view lab/test results, encounter notes, upcoming appointments, etc.  Non-urgent messages can be sent to your provider as well.   To learn more about what you can do with MyChart, go to [email or web address].    Your next appointment:   6 month(s)  The format for your next appointment:   In Person  Provider:   [name], MD       Important Information About Sugar

## 2021-12-22 ENCOUNTER — Telehealth: Payer: Self-pay | Admitting: Cardiovascular Disease

## 2021-12-22 NOTE — Telephone Encounter (Signed)
Pt c/o medication issue:  1. Name of Medication:  hydrALAZINE (APRESOLINE) 10 MG tablet  2. How are you currently taking this medication (dosage and times per day)?  Patient take twice daily  3. Are you having a reaction (difficulty breathing--STAT)?   4. What is your medication issue?   Patient would like to know if this medication needs to be modified. Patient assumes that if her BP is fine then the medication may need to be decreased, but she was unable to provide any BP readings.

## 2021-12-22 NOTE — Telephone Encounter (Signed)
Pt was calling to confirm that she is to continue her current med regimen/BP regimen based on last OV with Dr. Acie Fredrickson on 10/23.  Reviewed Dr. Elmarie Shiley last OV note with the pt on 10/23.  Informed her that he made no med changes at that visit, and wanted her to continue her current regimen as is.   Pt verbalized understanding and agrees with this plan.  Pt was gracious for all the assistance provided.

## 2021-12-28 DIAGNOSIS — Z1212 Encounter for screening for malignant neoplasm of rectum: Secondary | ICD-10-CM | POA: Diagnosis not present

## 2021-12-28 DIAGNOSIS — E538 Deficiency of other specified B group vitamins: Secondary | ICD-10-CM | POA: Diagnosis not present

## 2021-12-29 DIAGNOSIS — E559 Vitamin D deficiency, unspecified: Secondary | ICD-10-CM | POA: Diagnosis not present

## 2021-12-29 DIAGNOSIS — I10 Essential (primary) hypertension: Secondary | ICD-10-CM | POA: Diagnosis not present

## 2021-12-29 DIAGNOSIS — R7989 Other specified abnormal findings of blood chemistry: Secondary | ICD-10-CM | POA: Diagnosis not present

## 2021-12-29 DIAGNOSIS — M5116 Intervertebral disc disorders with radiculopathy, lumbar region: Secondary | ICD-10-CM | POA: Diagnosis not present

## 2021-12-29 DIAGNOSIS — E785 Hyperlipidemia, unspecified: Secondary | ICD-10-CM | POA: Diagnosis not present

## 2021-12-29 DIAGNOSIS — F419 Anxiety disorder, unspecified: Secondary | ICD-10-CM | POA: Diagnosis not present

## 2022-01-03 DIAGNOSIS — E538 Deficiency of other specified B group vitamins: Secondary | ICD-10-CM | POA: Diagnosis not present

## 2022-01-03 DIAGNOSIS — Z1212 Encounter for screening for malignant neoplasm of rectum: Secondary | ICD-10-CM | POA: Diagnosis not present

## 2022-01-04 DIAGNOSIS — K219 Gastro-esophageal reflux disease without esophagitis: Secondary | ICD-10-CM | POA: Diagnosis not present

## 2022-01-04 DIAGNOSIS — M5090 Cervical disc disorder, unspecified, unspecified cervical region: Secondary | ICD-10-CM | POA: Diagnosis not present

## 2022-01-04 DIAGNOSIS — I2584 Coronary atherosclerosis due to calcified coronary lesion: Secondary | ICD-10-CM | POA: Diagnosis not present

## 2022-01-04 DIAGNOSIS — E785 Hyperlipidemia, unspecified: Secondary | ICD-10-CM | POA: Diagnosis not present

## 2022-01-04 DIAGNOSIS — I679 Cerebrovascular disease, unspecified: Secondary | ICD-10-CM | POA: Diagnosis not present

## 2022-01-04 DIAGNOSIS — M5416 Radiculopathy, lumbar region: Secondary | ICD-10-CM | POA: Diagnosis not present

## 2022-01-04 DIAGNOSIS — Z23 Encounter for immunization: Secondary | ICD-10-CM | POA: Diagnosis not present

## 2022-01-04 DIAGNOSIS — Z0000 Encounter for general adult medical examination without abnormal findings: Secondary | ICD-10-CM | POA: Diagnosis not present

## 2022-01-04 DIAGNOSIS — I10 Essential (primary) hypertension: Secondary | ICD-10-CM | POA: Diagnosis not present

## 2022-01-04 DIAGNOSIS — C4330 Malignant melanoma of unspecified part of face: Secondary | ICD-10-CM | POA: Diagnosis not present

## 2022-01-04 DIAGNOSIS — I700 Atherosclerosis of aorta: Secondary | ICD-10-CM | POA: Diagnosis not present

## 2022-01-06 ENCOUNTER — Ambulatory Visit: Payer: Medicare Other | Admitting: Cardiovascular Disease

## 2022-01-10 DIAGNOSIS — M5116 Intervertebral disc disorders with radiculopathy, lumbar region: Secondary | ICD-10-CM | POA: Diagnosis not present

## 2022-01-17 ENCOUNTER — Ambulatory Visit: Payer: Medicare Other | Admitting: Cardiovascular Disease

## 2022-01-18 ENCOUNTER — Encounter: Payer: Self-pay | Admitting: Pharmacist

## 2022-01-18 ENCOUNTER — Ambulatory Visit: Payer: Medicare Other | Attending: Cardiology | Admitting: Pharmacist

## 2022-01-18 ENCOUNTER — Telehealth: Payer: Self-pay | Admitting: Pharmacist

## 2022-01-18 VITALS — BP 167/76

## 2022-01-18 DIAGNOSIS — I700 Atherosclerosis of aorta: Secondary | ICD-10-CM | POA: Diagnosis not present

## 2022-01-18 DIAGNOSIS — K5900 Constipation, unspecified: Secondary | ICD-10-CM | POA: Insufficient documentation

## 2022-01-18 DIAGNOSIS — Q513 Bicornate uterus: Secondary | ICD-10-CM | POA: Insufficient documentation

## 2022-01-18 DIAGNOSIS — M5416 Radiculopathy, lumbar region: Secondary | ICD-10-CM | POA: Insufficient documentation

## 2022-01-18 DIAGNOSIS — M791 Myalgia, unspecified site: Secondary | ICD-10-CM | POA: Insufficient documentation

## 2022-01-18 DIAGNOSIS — E7800 Pure hypercholesterolemia, unspecified: Secondary | ICD-10-CM

## 2022-01-18 DIAGNOSIS — I6523 Occlusion and stenosis of bilateral carotid arteries: Secondary | ICD-10-CM

## 2022-01-18 DIAGNOSIS — T466X5A Adverse effect of antihyperlipidemic and antiarteriosclerotic drugs, initial encounter: Secondary | ICD-10-CM | POA: Insufficient documentation

## 2022-01-18 DIAGNOSIS — N3000 Acute cystitis without hematuria: Secondary | ICD-10-CM | POA: Insufficient documentation

## 2022-01-18 DIAGNOSIS — Q7649 Other congenital malformations of spine, not associated with scoliosis: Secondary | ICD-10-CM | POA: Insufficient documentation

## 2022-01-18 DIAGNOSIS — E782 Mixed hyperlipidemia: Secondary | ICD-10-CM | POA: Insufficient documentation

## 2022-01-18 DIAGNOSIS — M47816 Spondylosis without myelopathy or radiculopathy, lumbar region: Secondary | ICD-10-CM | POA: Insufficient documentation

## 2022-01-18 DIAGNOSIS — M5136 Other intervertebral disc degeneration, lumbar region: Secondary | ICD-10-CM | POA: Insufficient documentation

## 2022-01-18 DIAGNOSIS — N3020 Other chronic cystitis without hematuria: Secondary | ICD-10-CM | POA: Insufficient documentation

## 2022-01-18 NOTE — Patient Instructions (Addendum)
It was nice meeting you today  Dr. Acie Fredrickson would like your LDL (bad cholesterol) to be less than 55  We would like to start you on a new medication called Repatha or Praluent.  Both of which you inject once every 2 weeks  I will complete the prior authorization for you and contact you when it is approved  Once you start the medication we will recheck your cholesterol in about 2-3 months  Please contact me with any questions. Also try to send me your most recent lab work from Dr [employer] office.  Karren Cobble, PharmD, [place], [place], [place], [place], [place], [address] Phone: [phone], Fax: [phone]

## 2022-01-18 NOTE — Progress Notes (Unsigned)
Patient ID: Wendy [name]                 DOB: June 03, 1945                    MRN: [id number]     HPI: Wendy [name] is a 76 y.o. female patient referred to lipid clinic by Dr Acie Fredrickson. PMH is significant for HTN, HLD, CAD, and elevated coronary calcium score. Patient is statin intolerant.  Presents today willing to start PCSK9i which was recommended by Dr Acie Fredrickson and Dr Forde Dandy.    Most of her concerns revolve around her HTN. Reports BP has been elevated since she had Covid this year. Currently on hydralazine '10mg'$  BID, losartan '50mg'$  and metoprolol succinate. Was previously on spironolactone but was discontinued due to electrolyte abnormalities.  Do not have current lab work in EHR.   Current Medications:  Rosuvastatin '5mg'$  once weekly Red Yeast Rice '1200mg'$  daily  Intolerances:  Statins  Risk Factors:  HTN CAD Elevated coronary calcium score  LDL goal: <55  Labs: Waiting on results from PCP  Imaging: Total calcium score of 103 is at percentile 58 for subjects of the same age, gender, and race/ethnicity.   Past Medical History:  Diagnosis Date   Anxiety    Atypical chest pain    B12 deficiency    Cervical spondylosis without myelopathy    Diverticulosis of colon    Dysesthesia    GERD (gastroesophageal reflux disease)    Hypercholesteremia    Hypertension    IBS (irritable bowel syndrome)    Malignant melanoma ([place])    Vitreous hemorrhage (HCC)     Current Outpatient Medications on File Prior to Visit  Medication Sig Dispense Refill   ALPRAZolam (XANAX) 0.25 MG tablet Take 1 tablet by mouth daily as needed.     ascorbic acid (VITAMIN C) 500 MG tablet Take 1 tablet (500 mg total) by mouth daily. 90 tablet 0   aspirin 81 MG tablet Take 81 mg by mouth daily.     b complex vitamins tablet Take 1 tablet by mouth daily.     Cholecalciferol (VITAMIN D) 2000 UNITS CAPS Take 1 capsule by mouth daily.     Coenzyme Q10 (COQ10) 200 MG CAPS Take 1 capsule by mouth daily.      cyclobenzaprine (FLEXERIL) 10 MG tablet Take 10 mg by mouth daily as needed.     DIGEST ENZYMES-ANTICHOLINERGIC PO Take 1 capsule by mouth 3 (three) times daily. PRN     hydrALAZINE (APRESOLINE) 10 MG tablet Take 1 tablet (10 mg total) by mouth 2 (two) times daily. 180 tablet 3   ibuprofen (ADVIL,MOTRIN) 200 MG tablet Take 200 mg by mouth every 6 (six) hours as needed for moderate pain.  (Patient not taking: Reported on 12/19/2021)     losartan (COZAAR) 100 MG tablet Take 100 mg by mouth daily.     metoprolol succinate (TOPROL-XL) 25 MG 24 hr tablet Take 25 mg by mouth daily.     Misc Natural Products (TART CHERRY ADVANCED) CAPS Take 2 capsules by mouth daily.     Multiple Vitamins-Minerals (MULTIVITAMIN & MINERAL PO) Take 1 tablet by mouth daily.     Omega-3 Fatty Acids (FISH OIL PO) Take 1 capsule by mouth daily.     Red Yeast Rice 600 MG CAPS Take 2 capsules by mouth daily.     rosuvastatin (CRESTOR) 5 MG tablet Take 5 mg by mouth once a week.  zinc sulfate 220 (50 Zn) MG capsule Take 1 capsule by mouth daily.     No current facility-administered medications on file prior to visit.    Allergies  Allergen Reactions   Hctz [Hydrochlorothiazide] Other (See Comments)    hyponatremia   Codeine Nausea Only and Nausea And Vomiting   Hydrocodone-Acetaminophen Nausea And Vomiting   Lisinopril Other (See Comments)    REACTION: INTOL to Prinivil w/ pain on urination    Neomycin Dermatitis   Other Nausea Only    fragrance   Oxycodone Nausea And Vomiting   Pravastatin Sodium Other (See Comments)    REACTION: INTOL to Pravachol w/ myalgias   Ciprofloxacin Other (See Comments)    Assessment/Plan:  1. Hyperlipidemia - Patient's last LDL in EHR 120 but from 2022. Reports will send lab results from recent OV with Dr Forde Dandy. Sent patient mychart enrollment code. Regardless, due to CAD and elevated coronary calcium score, will need lipid lowering since she is statin intolerant.  Using [employer]  demo pen, educated patient on storage, site selection, administration, and possible side effects. Patient was able to demonstrate use in room. Will complete PA and schedule repeat lipid panel in 2-3 months. Can d/c rosuvastatin and red yeast rice when she starts Repatha/Praluent.  Advised difficult to assess patient's hypertension without BP log and current lab work. Patient will send over recent labs and begin logging BP readings at home. Recheck in clinic in 1 week with log.  Start Repatha/Praluent q 2 weeks Recheck BP in 1 week  Karren Cobble, PharmD, [place], [place], [place], [place], [place], [address] Phone: [phone], Fax: [phone]

## 2022-01-18 NOTE — Telephone Encounter (Signed)
PA for Praluent submitted. Key: [id number]

## 2022-01-23 ENCOUNTER — Telehealth: Payer: Self-pay | Admitting: Cardiovascular Disease

## 2022-01-23 NOTE — Telephone Encounter (Signed)
11/27 177/100  8:30am 11/26 166/95  5:30p States she only ate thanksgiving meal on the actual day, not eating leftovers. She did eat [employer] chicken, baked beans, and coleslaw for lunch today. Informed her that her diet certainly ran up her sodium today to account for BP at 195/103. She took an additional Hydralazine '10mg'$  and Losartan '50mg'$  when she saw these numbers. She states she only takes '50mg'$  daily usually of Losartan. She is coming to see Karren Cobble, Rph on 11/29. They had discussed that she could use extra hydralazine if needed. Gayleen Sholtz that with no more BP readings than this, and many dietary indiscretions regarding sodium lately, I don't think we need to change her regimen at this time. Reiterated that she can use Hydralazine as needed and to monitor her BP more frequently and avoid sodium more closely. Repeat BP on call is 182/104 (20 minutes after additional hydralazine). Since BP is trending down, advised her to avoid salty foods tonight, repeat BP check prior to bed and take another hydralazine if needed. She will continue to monitor and call us back if needed. Gave ED precautions if she experiences vision changes, unilateral weakness, SOB, or acute severe headache.

## 2022-01-23 NOTE — Telephone Encounter (Signed)
Pt c/o BP issue: STAT if pt c/o blurred vision, one-sided weakness or slurred speech  1. What are your last 5 BP readings?   194/102  2. Are you having any other symptoms (ex. Dizziness, headache, blurred vision, passed out)? Headache, dizziness   3. What is your BP issue? Pt states her bp is high and she is worried. Please advise

## 2022-01-24 MED ORDER — PRALUENT 75 MG/ML ~~LOC~~ SOAJ: 1.0000 mL | SUBCUTANEOUS | 3 refills | Status: DC

## 2022-01-24 NOTE — Telephone Encounter (Signed)
Spoke with patient over phone. Woke up this morning and systolic BP was 354. Has not taken medications yet. Has not been able to figure out how to send over readings or recent lab work through [facility] yet. Has appointment tomorrow.  Recommended taking losartan '100mg'$ , hydralazine '20mg'$ , and metoprolol 25 mg this morning.,

## 2022-01-24 NOTE — Telephone Encounter (Signed)
Spoke with patient and relayed Praluent approved.

## 2022-01-25 ENCOUNTER — Ambulatory Visit: Payer: Medicare Other | Attending: Cardiology | Admitting: Pharmacist

## 2022-01-25 ENCOUNTER — Encounter: Payer: Self-pay | Admitting: Pharmacist

## 2022-01-25 VITALS — BP 161/94 | HR 61

## 2022-01-25 DIAGNOSIS — I10 Essential (primary) hypertension: Secondary | ICD-10-CM | POA: Insufficient documentation

## 2022-01-25 DIAGNOSIS — E7800 Pure hypercholesterolemia, unspecified: Secondary | ICD-10-CM | POA: Diagnosis not present

## 2022-01-25 DIAGNOSIS — Z860100 Personal history of colon polyps, unspecified: Secondary | ICD-10-CM | POA: Insufficient documentation

## 2022-01-25 DIAGNOSIS — K644 Residual hemorrhoidal skin tags: Secondary | ICD-10-CM | POA: Insufficient documentation

## 2022-01-25 DIAGNOSIS — I700 Atherosclerosis of aorta: Secondary | ICD-10-CM | POA: Diagnosis not present

## 2022-01-25 DIAGNOSIS — I6523 Occlusion and stenosis of bilateral carotid arteries: Secondary | ICD-10-CM | POA: Insufficient documentation

## 2022-01-25 DIAGNOSIS — N819 Female genital prolapse, unspecified: Secondary | ICD-10-CM | POA: Insufficient documentation

## 2022-01-25 DIAGNOSIS — M549 Dorsalgia, unspecified: Secondary | ICD-10-CM | POA: Insufficient documentation

## 2022-01-25 MED ORDER — CHLORTHALIDONE 25 MG PO TABS: 25.0000 mg | ORAL_TABLET | Freq: Every day | ORAL | 0 refills | Status: DC

## 2022-01-25 MED ORDER — PRALUENT 75 MG/ML ~~LOC~~ SOAJ: 1.0000 mL | SUBCUTANEOUS | 3 refills | Status: DC

## 2022-01-25 NOTE — Progress Notes (Signed)
Patient ID: Wendy [name]                 DOB: Jun 02, 1945                      MRN: [id number]     HPI: Wendy [name] is a 76 y.o. female referred by to HTN clinic. PMH is significant for HTN, CAD, aortic atherosclerosis and anxiety. Patient seen last week for lipid management but brought up concerns regarding her BP.  Patient presents today to discuss HTN. Currently on hydralazine 56m BID, losartan 554m(instructed patient to increase to 100 over phone yesterday) and metoprolol succinate 2534mnce a day.    Brought BP log. Readings consistently in 160s/90s and causing her anxiety. Has been on multiple medications in the past however they had been discontinued due to hyponatremia which was felt to be a COVID 19 complication .  Brought in current lab work from PCP office.  Scr 0.8, eGFR 69.7, Na 140, K 4.4, Cl 102.  PCP started on amlodipine 5mg54mst month however she took one dose and reported she did not feel well. Hands were tight and she had edema. Also has spironolactone at home.  Current HTN meds:  Hydralazine 10mg27m Losartan 100mg 49my Metoprolol succinate 25mg d7m  BP goal: <130/80  Wt Readings from Last 3 Encounters:  12/19/21 144 lb 3.2 oz (65.4 kg)  11/15/21 143 lb (64.9 kg)  04/30/21 140 lb (63.5 kg)   BP Readings from Last 3 Encounters:  01/25/22 (!) 161/94  01/18/22 (!) 167/76  12/19/21 128/80   Pulse Readings from Last 3 Encounters:  01/25/22 61  12/19/21 62  11/15/21 (!) 59    Renal function: CrCl cannot be calculated (Patient's most recent lab result is older than the maximum 21 days allowed.).  Past Medical History:  Diagnosis Date   Anxiety    Atypical chest pain    B12 deficiency    Cervical spondylosis without myelopathy    Diverticulosis of colon    Dysesthesia    GERD (gastroesophageal reflux disease)    Hypercholesteremia    Hypertension    IBS (irritable bowel syndrome)    Malignant melanoma (HCC)   [place]      Current Outpatient Medications on File Prior to Visit  Medication Sig Dispense Refill   ALPRAZolam (XANAX) 0.25 MG tablet Take 1 tablet by mouth daily as needed.     ascorbic acid (VITAMIN C) 500 MG tablet Take 1 tablet (500 mg total) by mouth daily. 90 tablet 0   aspirin 81 MG tablet Take 81 mg by mouth daily.     b complex vitamins tablet Take 1 tablet by mouth daily.     Cholecalciferol (VITAMIN D) 2000 UNITS CAPS Take 1 capsule by mouth daily.     Coenzyme Q10 (COQ10) 200 MG CAPS Take 1 capsule by mouth daily.     cyclobenzaprine (FLEXERIL) 10 MG tablet Take 10 mg by mouth daily as needed.     DIGEST ENZYMES-ANTICHOLINERGIC PO Take 1 capsule by mouth 3 (three) times daily. PRN     hydrALAZINE (APRESOLINE) 10 MG tablet Take 1 tablet (10 mg total) by mouth 2 (two) times daily. 180 tablet 3   losartan (COZAAR) 100 MG tablet Take 100 mg by mouth daily.     metoprolol succinate (TOPROL-XL) 25 MG 24 hr tablet Take 25 mg by mouth daily.     Misc Natural Products (TART CHERRY ADVANCED)  CAPS Take 2 capsules by mouth daily.     Multiple Vitamins-Minerals (MULTIVITAMIN & MINERAL PO) Take 1 tablet by mouth daily.     Omega-3 Fatty Acids (FISH OIL PO) Take 1 capsule by mouth daily.     omeprazole (PRILOSEC) 40 MG capsule 1 capsule 30 minutes before morning meal Orally Once a day for cough/hoarseness/acid reflux for 30 day(s)     Red Yeast Rice 600 MG CAPS Take 2 capsules by mouth daily.     rosuvastatin (CRESTOR) 5 MG tablet Take 5 mg by mouth once a week.     zinc sulfate 220 (50 Zn) MG capsule Take 1 capsule by mouth daily.     No current facility-administered medications on file prior to visit.    Allergies  Allergen Reactions   Hctz [Hydrochlorothiazide] Other (See Comments)    hyponatremia   Codeine Nausea Only and Nausea And Vomiting   Hydrocodone-Acetaminophen Nausea And Vomiting   Lisinopril Other (See Comments)    REACTION: INTOL to Prinivil w/ pain on urination    Neomycin  Dermatitis   Other Nausea Only    fragrance   Oxycodone Nausea And Vomiting   Pravastatin Sodium Other (See Comments)    REACTION: INTOL to Pravachol w/ myalgias   Ciprofloxacin Other (See Comments)     Assessment/Plan:  1. Hypertension -   HYPERTENSION CONTROL Vitals:   01/25/22 1400 01/25/22 1425  BP: (!) 165/84 (!) 161/94    The patient's blood pressure is elevated above target today.  In order to address the patient's elevated BP: A new medication was prescribed today.   Patient BP 165/84 which is above goal of <130/80. Patient needs further BP lowering. Lab work from [facility] is reassuring however patient will need close monitoring of renal function and electrolytes. Reports she had previously been well controlled on HCTZ. Will start chlorthalidone 12.85m for 1 week and recheck BMP. If normal will consider increase to 221mdepending on home BP readings. Patient can d/c hydralazine at this time. Advised to keep amlodipine and spironolactone on hand as well in case they need to be restarted. Patient voiced understanding.  Continue losartan 10067maily Continue metoprolol 35m60mily Stop hydralazine 10mg80m Start chlorthalidone 12.5mg x77mweek and then increase to 35mg C24m BMP in 1 week Recheck in 4 weeks  Chris PKarren CobbleD, BCACP, [place], [place] [address] [id number] [phone]

## 2022-01-25 NOTE — Patient Instructions (Signed)
It was nice seeing you again  We would like your blood pressure to be less than 130/80  Please continue your losartan '100mg'$  once a day and metoprolol '25mg'$  once a day  We will start a new medication called chlorthalidone '25mg'$ .  I would like you take 1/2 tablet once a day in the morning for 1 week.   We will check your lab work in 1 week and if everything looks normal, we can increase to the full dose  Please call me or send a message with any questions  Karren Cobble, PharmD, [place], [place], [place], [place], [place], [address] Phone: [phone], Fax: [phone]

## 2022-01-26 ENCOUNTER — Telehealth: Payer: Self-pay | Admitting: Pharmacist

## 2022-01-26 NOTE — Telephone Encounter (Signed)
Patient called. Picked up chlorthalidone yesterday, has not started. Read about side effects and was concerned regarding her K levels. Advised we would check her lab work in 1 week. Recommended taking chlorthalidone in morning and losartan in the evening and to not take her blood pressure as frequently since it is causing her anxiety. Patient voiced understanding.

## 2022-01-29 ENCOUNTER — Other Ambulatory Visit: Payer: Self-pay

## 2022-01-29 ENCOUNTER — Emergency Department
Admission: EM | Admit: 2022-01-29 | Discharge: 2022-01-29 | Discharge status: Against Medical Advice | Payer: Medicare Other | Attending: Emergency Medicine | Admitting: Emergency Medicine

## 2022-01-29 ENCOUNTER — Encounter: Payer: Self-pay | Admitting: Emergency Medicine

## 2022-01-29 DIAGNOSIS — I10 Essential (primary) hypertension: Secondary | ICD-10-CM | POA: Diagnosis not present

## 2022-01-29 DIAGNOSIS — R55 Syncope and collapse: Secondary | ICD-10-CM | POA: Insufficient documentation

## 2022-01-29 DIAGNOSIS — Z5321 Procedure and treatment not carried out due to patient leaving prior to being seen by health care provider: Secondary | ICD-10-CM | POA: Insufficient documentation

## 2022-01-29 DIAGNOSIS — R0689 Other abnormalities of breathing: Secondary | ICD-10-CM | POA: Diagnosis not present

## 2022-01-29 DIAGNOSIS — R519 Headache, unspecified: Secondary | ICD-10-CM | POA: Diagnosis present

## 2022-01-29 DIAGNOSIS — I959 Hypotension, unspecified: Secondary | ICD-10-CM | POA: Diagnosis not present

## 2022-01-29 LAB — URINALYSIS, ROUTINE W REFLEX MICROSCOPIC
Bilirubin Urine: NEGATIVE
Glucose, UA: NEGATIVE mg/dL
Hgb urine dipstick: NEGATIVE
Ketones, ur: NEGATIVE mg/dL
Nitrite: NEGATIVE
Protein, ur: NEGATIVE mg/dL
Specific Gravity, Urine: 1.012 (ref 1.005–1.030)
pH: 6 (ref 5.0–8.0)

## 2022-01-29 LAB — BASIC METABOLIC PANEL
Anion gap: 10 (ref 5–15)
BUN: 22 mg/dL (ref 8–23)
CO2: 26 mmol/L (ref 22–32)
Calcium: 9.5 mg/dL (ref 8.9–10.3)
Chloride: 92 mmol/L — ABNORMAL LOW (ref 98–111)
Creatinine, Ser: 0.92 mg/dL (ref 0.44–1.00)
GFR, Estimated: >60 mL/min (ref >60)
Glucose, Bld: 141 mg/dL — ABNORMAL HIGH (ref 70–99)
Potassium: 3.5 mmol/L (ref 3.5–5.1)
Sodium: 128 mmol/L — ABNORMAL LOW (ref 135–145)

## 2022-01-29 LAB — CBC
HCT: 40.7 % (ref 36.0–46.0)
Hemoglobin: 13.6 g/dL (ref 12.0–15.0)
MCH: 29.1 pg (ref 26.0–34.0)
MCHC: 33.4 g/dL (ref 30.0–36.0)
MCV: 87.2 fL (ref 80.0–100.0)
Platelets: 269 10*3/uL (ref 150–400)
RBC: 4.67 MIL/uL (ref 3.87–5.11)
RDW: 12.2 % (ref 11.5–15.5)
WBC: 9.4 10*3/uL (ref 4.0–10.5)
nRBC: 0 % (ref 0.0–0.2)

## 2022-01-29 MED ORDER — ONDANSETRON HCL 4 MG/2ML IJ SOLN
4.0000 mg | Freq: Once | INTRAMUSCULAR | Status: AC
  Administered 2022-01-29: 4 mg via INTRAVENOUS
  Filled 2022-01-29: qty 2

## 2022-01-29 NOTE — ED Provider Triage Note (Signed)
Emergency Medicine Provider Triage Evaluation Note  MAE DENUNZIO, a 76 y.o. female  was evaluated in triage.  Pt complains of syncopal episode.  She presents to the ED via EMS from a funeral where she had some with a syncopal episode.  She was sitting during the service and suddenly felt hot when she fainted.  She reports currently feeling lightheaded and nauseous but denies any head injury or weakness.  Review of Systems  Positive: Syncope, SOB Negative: CP  Physical Exam  BP (!) 161/93 (BP Location: Right Arm)   Pulse (!) 55   Temp 97.9 F (36.6 C) (Oral)   Resp 18   SpO2 96%  Gen:   Awake, no distress  NAD Resp:  Normal effort CTA MSK:   Moves extremities without difficulty  Other:    Medical Decision Making  Medically screening exam initiated at 5:29 PM.  Appropriate orders placed.  [name] was informed that the remainder of the evaluation will be completed by another provider, this initial triage assessment does not replace that evaluation, and the importance of remaining in the ED until their evaluation is complete.  Geriatric patient to the ED via EMS for witnessed syncopal episode while sitting during a funeral service.   Melvenia Needles, PA-C 01/29/22 1730

## 2022-01-29 NOTE — ED Triage Notes (Signed)
Patient to ED via EMS from a funeral for syncopal episode. Patient states she was sitting at service when she sudden got hot and fainted. Patient currently feeling lightheaded, nauseous, and like she can't take a deep breath.  18 L AC from EMS

## 2022-01-29 NOTE — ED Notes (Signed)
Patient AOX4. States she is going to leave without treatment. Verbalized understanding that leaving without treatment may result in deterioration in health and could result in death. Ambulatory from ED lobby.

## 2022-01-29 NOTE — ED Triage Notes (Signed)
Pt in via [facility] form church with c/o syncope that lasted 3 minutes. No head injury, has had recent changes in meds and has not eaten today. Pt c/o headache 200/100, #18g to left AC, pt was given 174ms of fluid and '4mg'$  of zofran

## 2022-01-30 ENCOUNTER — Ambulatory Visit: Payer: Medicare Other | Attending: General Practice

## 2022-01-30 ENCOUNTER — Ambulatory Visit: Payer: Medicare Other | Attending: General Practice | Admitting: General Practice

## 2022-01-30 ENCOUNTER — Encounter: Payer: Self-pay | Admitting: General Practice

## 2022-01-30 VITALS — BP 132/72 | HR 59 | Ht 60.0 in | Wt 143.8 lb

## 2022-01-30 DIAGNOSIS — I6523 Occlusion and stenosis of bilateral carotid arteries: Secondary | ICD-10-CM | POA: Diagnosis not present

## 2022-01-30 DIAGNOSIS — I10 Essential (primary) hypertension: Secondary | ICD-10-CM | POA: Diagnosis not present

## 2022-01-30 DIAGNOSIS — E782 Mixed hyperlipidemia: Secondary | ICD-10-CM | POA: Insufficient documentation

## 2022-01-30 DIAGNOSIS — R55 Syncope and collapse: Secondary | ICD-10-CM | POA: Diagnosis not present

## 2022-01-30 MED ORDER — CHLORTHALIDONE 25 MG PO TABS: 12.5000 mg | ORAL_TABLET | Freq: Every day | ORAL | 6 refills | Status: DC

## 2022-01-30 NOTE — Patient Instructions (Signed)
Medication Instructions:  CONTINUE CHLORTHALIDONE 12.'5MG'$  DAILY (1/2 TAB)  *If you need a refill on your cardiac medications before your next appointment, please call your pharmacy*  Lab Work: NONE If you have labs (blood work) drawn today and your tests are completely normal, you will receive your results only by:  [place] (if you have MyChart) OR  A paper copy in the mail If you have any lab test that is abnormal or we need to change your treatment, we will call you to review the results.  Testing/Procedures: NONE  Other Instructions Your physician has requested you wear a ZIO patch monitor for 7 days. SEE BELOW  SLIGHTLY INCREASE YOUR HYDRATION  Follow-Up: At [facility], you and your health needs are our priority.  As part of our continuing mission to provide you with exceptional heart care, we have created designated Provider Care Teams.  These Care Teams include your primary Cardiologist (physician) and Advanced Practice Providers (APPs -  Physician Assistants and Nurse Practitioners) who all work together to provide you with the care you need, when you need it.  Your next appointment:   6-8 week(s)  The format for your next appointment:   In Person  Provider:   [name], MD  or Coletta Memos, FNP        KEEP YOUR SCHEDULED APPOINTMENTS  Important Information About [place]       [place] Term Monitor Instructions  Your physician has requested you wear a ZIO patch monitor for 7 days.  This is a single patch monitor. [employer] supplies one patch monitor per enrollment. Additional stickers are not available. Please do not apply patch if you will be having a Nuclear Stress Test,  Echocardiogram, Cardiac CT, MRI, or Chest Xray during the period you would be wearing the  monitor. The patch cannot be worn during these tests. You cannot remove and re-apply the  ZIO XT patch monitor.  Your ZIO patch monitor will be mailed 3 day USPS to your address on  file. It may take 3-5 days  to receive your monitor after you have been enrolled.  Once you have received your monitor, please review the enclosed instructions. Your monitor  has already been registered assigning a specific monitor serial # to you.  Billing and Patient Assistance Program Information  We have supplied [employer] with any of your insurance information on file for billing purposes. [employer] offers a sliding scale Patient Assistance Program for patients that do not have  insurance, or whose insurance does not completely cover the cost of the ZIO monitor.  You must apply for the Patient Assistance Program to qualify for this discounted rate.  To apply, please call [employer] at [phone], select option 4, select option 2, ask to apply for  Patient Assistance Program. Theodore Demark will ask your household income, and how many people  are in your household. They will quote your out-of-pocket cost based on that information.  [employer] will also be able to set up a 58-month interest-free payment plan if needed.  Applying the monitor   Shave hair from upper left chest.  Hold abrader disc by orange tab. Rub abrader in 40 strokes over the upper left chest as  indicated in your monitor instructions.  Clean area with 4 enclosed alcohol pads. Let dry.  Apply patch as indicated in monitor instructions. Patch will be placed under collarbone on left  side of chest with arrow pointing upward.  Rub patch adhesive wings for 2 minutes. Remove  white label marked "1". Remove the white  label marked "2". Rub patch adhesive wings for 2 additional minutes.  While looking in a mirror, press and release button in center of patch. A small green light will  flash 3-4 times. This will be your only indicator that the monitor has been turned on.  Do not shower for the first 24 hours. You may shower after the first 24 hours.  Press the button if you feel a symptom. You will hear a small click. Record Date, Time and   Symptom in the Patient Logbook.  When you are ready to remove the patch, follow instructions on the last 2 pages of Patient  Logbook. Stick patch monitor onto the last page of Patient Logbook.  Place Patient Logbook in the blue and white box. Use locking tab on box and tape box closed  securely. The blue and white box has prepaid postage on it. Please place it in the mailbox as  soon as possible. Your physician should have your test results approximately 7 days after the  monitor has been mailed back to [facility].  Call [place] at [phone] if you have questions regarding  your ZIO XT patch monitor. Call them immediately if you see an orange light blinking on your  monitor.  If your monitor falls off in less than 4 days, contact our Monitor department at [phone].  If your monitor becomes loose or falls off after 4 days call [employer] at [phone] for  suggestions on securing your monitor

## 2022-01-30 NOTE — Progress Notes (Signed)
Cardiology Clinic Note   Patient Name: Wendy [name] Date of Encounter: 01/30/2022  Primary Care Provider:  Reynold Bowen, MD Primary Cardiologist:  [name], MD  Patient Profile    Wendy [name] 76 year old female presents the clinic today for evaluation of her syncope.  Past Medical History    Past Medical History:  Diagnosis Date   Anxiety    Atypical chest pain    B12 deficiency    Cervical spondylosis without myelopathy    Diverticulosis of colon    Dysesthesia    GERD (gastroesophageal reflux disease)    Hypercholesteremia    Hypertension    IBS (irritable bowel syndrome)    Malignant melanoma ([place])    Vitreous hemorrhage (HCC)    Past Surgical History:  Procedure Laterality Date   anterior cervical discectomy and fusion  1996   Dr. Sherwood Gambler   arthroscopic shoulder surgery     CESAREAN SECTION     melanoma surgery from ant neck region  1995   skin cancer removed from nose      Allergies  Allergies  Allergen Reactions   Hctz [Hydrochlorothiazide] Other (See Comments)    hyponatremia   Codeine Nausea Only and Nausea And Vomiting   Hydrocodone-Acetaminophen Nausea And Vomiting   Lisinopril Other (See Comments)    REACTION: INTOL to Prinivil w/ pain on urination    Neomycin Dermatitis   Other Nausea Only    fragrance   Oxycodone Nausea And Vomiting   Pravastatin Sodium Other (See Comments)    REACTION: INTOL to Pravachol w/ myalgias   Ciprofloxacin Other (See Comments)    History of Present Illness    Wendy [name] is a PMH of hypertension, bradycardia, syncope, aortic atherosclerosis, bilateral carotid artery stenosis, and hypercholesterolemia.  She was seen by our clinical pharmacist in the hypertension clinic on 01/25/2022.  Her blood pressures were noted to be consistently running in the 160s over 90s.  She was anxious related to her elevated blood pressure.  She had been on multiple medications however she was not able to tolerate  due to hyponatremia which was felt to be a complication of UEAVW-09 infection.  Blood pressure goal was established at less than 130/80.  Her blood pressure medications were hydralazine 10 mg twice daily, losartan 100 mg daily, metoprolol succinate 25 mg daily.  Chlorthalidone 12.5 mg was started with plan to repeat BMP in 1 week.  Her hydralazine was discontinued.  She was advised to keep her amlodipine and spironolactone on hand in case she would need to restart them in the future.  She presented to the emergency department on 01/29/2022.  She had been at a funeral service became hot and had a syncopal event.  She had not yet eaten that day.  She was transported to the emergency department via EMS.  Her lab work showed a stable sodium at 128, creatinine 0.92, glucose 141, CBC unremarkable, EKG showed sinus bradycardia.  She left the emergency department without treatment.  She presents to the clinic today for evaluation and states she had an episode of fainting while at a funeral service yesterday.  She was taken to the emergency department where she had labs drawn.  We reviewed her emergency department visit.  She reported that she was there for 5 hours left.  She has been trying to limit her fluid intake due to her low sodium.  She reports taking her losartan and metoprolol as prescribed.  She only took her HCTZ 1  time last week.  She did note that she felt better after receiving IV fluids on the way to the emergency department.  We also reviewed her urinalysis and she expressed understanding.  I instructed her to contact her PCP if she notices urgency, burning with urination.  She notes that when she was being evaluated during her syncopal episode that her heart rate was elevated.  I will evaluate for arrhythmia and irregular beats with a cardiac event monitor.  We will continue her current blood pressure regimen.  She did indicate that she would be willing to retry amlodipine in the future if needed for  blood pressure control.  On recheck today her blood pressure is 132/72.  Today she denies chest pain, shortness of breath, lower extremity edema, fatigue, palpitations, melena, hematuria, hemoptysis, diaphoresis, weakness, presyncope, syncope, orthopnea, and PND.   Home Medications    Prior to Admission medications   Medication Sig Start Date End Date Taking? Authorizing Provider  Alirocumab (PRALUENT) 75 MG/ML SOAJ Inject 1 mL into the skin every 14 (fourteen) days. 01/25/22   Nahser, Wendy Cheng, MD  ALPRAZolam Duanne Moron) 0.25 MG tablet Take 1 tablet by mouth daily as needed.    [provider]  ascorbic acid (VITAMIN C) 500 MG tablet Take 1 tablet (500 mg total) by mouth daily. 05/04/21   Lorella Nimrod, MD  aspirin 81 MG tablet Take 81 mg by mouth daily.    [provider]  b complex vitamins tablet Take 1 tablet by mouth daily.    [provider]  chlorthalidone (HYGROTON) 25 MG tablet Take 1 tablet (25 mg total) by mouth daily. 01/25/22   Nahser, Wendy Cheng, MD  Cholecalciferol (VITAMIN D) 2000 UNITS CAPS Take 1 capsule by mouth daily.    [provider]  Coenzyme Q10 (COQ10) 200 MG CAPS Take 1 capsule by mouth daily.    [provider]  cyclobenzaprine (FLEXERIL) 10 MG tablet Take 10 mg by mouth daily as needed. 12/08/21   [provider]  DIGEST ENZYMES-ANTICHOLINERGIC PO Take 1 capsule by mouth 3 (three) times daily. PRN    [provider]  losartan (COZAAR) 100 MG tablet Take 100 mg by mouth daily. 10/07/21   [provider]  metoprolol succinate (TOPROL-XL) 25 MG 24 hr tablet Take 25 mg by mouth daily.    [provider]  Misc Natural Products (TART CHERRY ADVANCED) CAPS Take 2 capsules by mouth daily.    [provider]  Multiple Vitamins-Minerals (MULTIVITAMIN & MINERAL PO) Take 1 tablet by mouth daily.    [provider]  Omega-3 Fatty Acids (FISH OIL PO) Take 1 capsule by mouth daily.     [provider]  omeprazole (PRILOSEC) 40 MG capsule 1 capsule 30 minutes before morning meal Orally Once a day for cough/hoarseness/acid reflux for 30 day(s)    [provider]  Red Yeast Rice 600 MG CAPS Take 2 capsules by mouth daily.    [provider]  rosuvastatin (CRESTOR) 5 MG tablet Take 5 mg by mouth once a week. 12/27/20   [provider]  zinc sulfate 220 (50 Zn) MG capsule Take 1 capsule by mouth daily.    [provider]    Family History    Family History  Problem Relation Age of Onset   Diabetes Mother    Heart disease Mother    Hyperlipidemia Mother    Hypertension Mother    Cancer Father    Deep vein thrombosis Father  Heart attack Father    Cancer Brother    She indicated that her mother is alive. She indicated that her father is alive. She indicated that the status of her brother is unknown. She indicated that the status of her paternal grandfather is unknown and reported the following: heart disease. She indicated that both of her others are alive.  Social History    Social History   Socioeconomic History   Marital status: Married    Spouse name: timothy Garringer   Number of children: 2   Years of education: Not on file   Highest education level: Not on file  Occupational History   Occupation: retired Energy manager: [employer]  Tobacco Use   Smoking status: Never   Smokeless tobacco: Never  Substance and Sexual Activity   Alcohol use: No   Drug use: No   Sexual activity: Not on file  Other Topics Concern   Not on file  Social History Narrative   Not on file   Social Determinants of Health   Financial Resource Strain: Not on file  Food Insecurity: Not on file  Transportation Needs: Not on file  Physical Activity: Not on file  Stress: Not on file  Social Connections: Not on file  Intimate Partner Violence: Not on file     Review of Systems    General:  No chills, fever,  night sweats or weight changes.  Cardiovascular:  No chest pain, dyspnea on exertion, edema, orthopnea, palpitations, paroxysmal nocturnal dyspnea. Dermatological: No rash, lesions/masses Respiratory: No cough, dyspnea Urologic: No hematuria, dysuria Abdominal:   No nausea, vomiting, diarrhea, bright red blood per rectum, melena, or hematemesis Neurologic:  No visual changes, wkns, changes in mental status. All other systems reviewed and are otherwise negative except as noted above.  Physical Exam    VS:  BP 132/72   Pulse (!) 59   Ht 5' (1.524 m)   Wt 143 lb 12.8 oz (65.2 kg)   SpO2 96%   BMI 28.08 kg/m  , BMI Body mass index is 28.08 kg/m. GEN: Well nourished, well developed, in no acute distress. HEENT: normal. Neck: Supple, no JVD, carotid bruits, or masses. Cardiac: RRR, no murmurs, rubs, or gallops. No clubbing, cyanosis, edema.  Radials/DP/PT 2+ and equal bilaterally.  Respiratory:  Respirations regular and unlabored, clear to auscultation bilaterally. GI: Soft, nontender, nondistended, BS + x 4. MS: no deformity or atrophy. Skin: warm and dry, no rash. Neuro:  Strength and sensation are intact. Psych: Normal affect.  Accessory Clinical Findings    Recent Labs: 05/01/2021: TSH 1.464 05/02/2021: Magnesium 2.2 05/03/2021: ALT 24 01/29/2022: BUN 22; Creatinine, Ser 0.92; Hemoglobin 13.6; Platelets 269; Potassium 3.5; Sodium 128   Recent Lipid Panel    Component Value Date/Time   CHOL 199 03/30/2016 1135   TRIG 181 (H) 03/30/2016 1135   HDL 63 03/30/2016 1135   CHOLHDL 3.2 03/30/2016 1135   CHOLHDL 3 04/18/2013 1106   VLDL 29.4 04/18/2013 1106   LDLCALC 100 (H) 03/30/2016 1135   LDLDIRECT 140.8 03/27/2011 0938         ECG personally reviewed by me today-none today.  Carotid ultrasound 08/14/2019  Summary:  Right Carotid: Velocities in the right ICA are consistent with a 1-39%  stenosis.   Left Carotid: Velocities in the left ICA are consistent with a 1-39%   stenosis.   Vertebrals: Bilateral vertebral arteries demonstrate antegrade flow.  Subclavians: Right subclavian artery was stenotic. Right subclavian artery  flow              was disturbed. Normal flow hemodynamics were seen in the left               subclavian artery.    Assessment & Plan   1.  Syncope-no further episodes.  Was at a funeral service and had witnessed syncopal episode.  Presented to the emergency department and left without treatment.  Labs stable.  Felt better after receiving IV fluids.  Will evaluate for arrhythmia. Lower extremity support stockings, change positions slowly Order 7-day cardiac event monitor  Carotid stenosis-denies lightheadedness and vision changes.  Carotid Doppler 08/14/2019 showed 1-39% right and left stenosis. Heart healthy low-sodium high-fiber diet Continue aspirin, red yeast rice, rosuvastatin  Essential hypertension-BP today 132/72.  Blood pressure improving with recent addition of chlorthalidone. Continue metoprolol, losartan, chlorthalidone Following with hypertension clinic  Hyperlipidemia-LDL 100 on 03/30/2016 Continue aspirin, red yeast rice, rosuvastatin Heart healthy low-sodium high-fiber diet increase physical activity as tolerated Follows with PCP  Disposition: Follow-up with Dr. Acie Fredrickson or APP in 4 to 6 weeks.   Jossie Ng. Onedia Vargus NP-C     01/30/2022, 2:18 PM [place] [address] Office [phone] Fax [phone]    I spent 14 minutes examining this patient, reviewing medications, and using patient centered shared decision making involving her cardiac care.  Prior to her visit I spent greater than 20 minutes reviewing her past medical history,  medications, and prior cardiac tests.

## 2022-01-30 NOTE — Progress Notes (Unsigned)
Enrolled for [employer] to mail a ZIO XT long term holter monitor to the patients address on file.   Dr. Nahser to read. 

## 2022-01-31 ENCOUNTER — Other Ambulatory Visit: Payer: Self-pay

## 2022-01-31 ENCOUNTER — Telehealth: Payer: Self-pay | Admitting: Pharmacist

## 2022-01-31 ENCOUNTER — Emergency Department: Payer: Medicare Other

## 2022-01-31 ENCOUNTER — Emergency Department
Admission: EM | Admit: 2022-01-31 | Discharge: 2022-02-01 | Disposition: A | Discharge status: Home / Self Care | Payer: Medicare Other | Attending: Emergency Medicine | Admitting: Emergency Medicine

## 2022-01-31 DIAGNOSIS — Z8616 Personal history of COVID-19: Secondary | ICD-10-CM | POA: Insufficient documentation

## 2022-01-31 DIAGNOSIS — N390 Urinary tract infection, site not specified: Secondary | ICD-10-CM | POA: Diagnosis not present

## 2022-01-31 DIAGNOSIS — R0602 Shortness of breath: Secondary | ICD-10-CM | POA: Diagnosis not present

## 2022-01-31 DIAGNOSIS — Z7982 Long term (current) use of aspirin: Secondary | ICD-10-CM | POA: Diagnosis not present

## 2022-01-31 DIAGNOSIS — R55 Syncope and collapse: Secondary | ICD-10-CM | POA: Insufficient documentation

## 2022-01-31 DIAGNOSIS — I6782 Cerebral ischemia: Secondary | ICD-10-CM | POA: Diagnosis not present

## 2022-01-31 DIAGNOSIS — B9689 Other specified bacterial agents as the cause of diseases classified elsewhere: Secondary | ICD-10-CM | POA: Diagnosis not present

## 2022-01-31 DIAGNOSIS — R110 Nausea: Secondary | ICD-10-CM | POA: Diagnosis present

## 2022-01-31 DIAGNOSIS — Z85828 Personal history of other malignant neoplasm of skin: Secondary | ICD-10-CM | POA: Insufficient documentation

## 2022-01-31 DIAGNOSIS — E871 Hypo-osmolality and hyponatremia: Secondary | ICD-10-CM

## 2022-01-31 DIAGNOSIS — Z79899 Other long term (current) drug therapy: Secondary | ICD-10-CM | POA: Diagnosis not present

## 2022-01-31 DIAGNOSIS — I10 Essential (primary) hypertension: Secondary | ICD-10-CM

## 2022-01-31 DIAGNOSIS — I6529 Occlusion and stenosis of unspecified carotid artery: Secondary | ICD-10-CM | POA: Diagnosis not present

## 2022-01-31 LAB — CBC
HCT: 42.3 % (ref 36.0–46.0)
Hemoglobin: 13.8 g/dL (ref 12.0–15.0)
MCH: 29.4 pg (ref 26.0–34.0)
MCHC: 32.6 g/dL (ref 30.0–36.0)
MCV: 90 fL (ref 80.0–100.0)
Platelets: 248 10*3/uL (ref 150–400)
RBC: 4.7 MIL/uL (ref 3.87–5.11)
RDW: 12 % (ref 11.5–15.5)
WBC: 9.2 10*3/uL (ref 4.0–10.5)
nRBC: 0 % (ref 0.0–0.2)

## 2022-01-31 LAB — BASIC METABOLIC PANEL
Anion gap: 7 (ref 5–15)
BUN: 13 mg/dL (ref 8–23)
CO2: 28 mmol/L (ref 22–32)
Calcium: 9.9 mg/dL (ref 8.9–10.3)
Chloride: 94 mmol/L — ABNORMAL LOW (ref 98–111)
Creatinine, Ser: 0.86 mg/dL (ref 0.44–1.00)
GFR, Estimated: >60 mL/min (ref >60)
Glucose, Bld: 106 mg/dL — ABNORMAL HIGH (ref 70–99)
Potassium: 3.7 mmol/L (ref 3.5–5.1)
Sodium: 129 mmol/L — ABNORMAL LOW (ref 135–145)

## 2022-01-31 LAB — TROPONIN I (HIGH SENSITIVITY): Troponin I (High Sensitivity): 5 ng/L (ref <18)

## 2022-01-31 MED ORDER — SODIUM CHLORIDE 0.9 % IV BOLUS
1000.0000 mL | Freq: Once | INTRAVENOUS | Status: AC
  Administered 2022-02-01: 1000 mL via INTRAVENOUS

## 2022-01-31 NOTE — ED Provider Notes (Signed)
[facility] Provider Note    Event Date/Time   First MD Initiated Contact with Patient 01/31/22 2328     (approximate)   History   Shortness of Breath and Loss of Consciousness   HPI  Wendy [name] is a 76 y.o. female who presents to the ED from home for completion of syncope work-up.  Patient left without being seen on 12/3 after syncopal episode while attending a funeral.  She describes feeling very hot during the funeral, felt nauseous and had a brief syncopal episode.  She had labs drawn at triage but left without being seen.  She did follow-up with her cardiologist the following day who changed some of her antihypertensive medications and gave her 1 L IV fluids due to persistent hyponatremia.  She felt better after the fluids and went home.  In the meantime she discovered that she had been taking her chlorthalidone wrong; had been taking a whole tablet x3 days instead of 1/2 tablet.  Presents tonight because she had an episode of feeling short of breath.  Endorses COVID-19 infection back in March of this year.  Denies associated fever/chills, cough, chest pain, abdominal pain, nausea, vomiting, dysuria or diarrhea.     Past Medical History   Past Medical History:  Diagnosis Date   Anxiety    Atypical chest pain    B12 deficiency    Cervical spondylosis without myelopathy    Diverticulosis of colon    Dysesthesia    GERD (gastroesophageal reflux disease)    Hypercholesteremia    Hypertension    IBS (irritable bowel syndrome)    Malignant melanoma ([place])    Vitreous hemorrhage [facility])      Active Problem List   Patient Active Problem List   Diagnosis Date Noted   Backache 01/25/2022   Prolapse of female genital organs 01/25/2022   Personal history of colonic polyps 01/25/2022   External hemorrhoids 01/25/2022   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   Chronic cystitis 01/18/2022   Degeneration of lumbar  intervertebral disc 01/18/2022   Lumbar spondylosis 01/18/2022   Lumbar radiculopathy 01/18/2022   Constipation 01/18/2022   Myalgia due to statin 01/18/2022   Aortic atherosclerosis ([place]) 01/18/2022   Hyponatremia 04/30/2021   Weakness 04/30/2021   COVID-19 virus infection 04/30/2021   Pain in wrist 01/16/2019   Neck pain 01/10/2019   Impacted cerumen of both ears 08/07/2018   Eustachian tube dysfunction, bilateral 08/07/2018   Mixed hyperlipidemia 03/30/2016   Vaginitis, atrophic 05/26/2015   Carotid artery disease ([place]) 02/15/2015   Voiding dysfunction 12/11/2014   Third degree hemorrhoids 10/13/2014   Procidentia of uterus 09/23/2014   Occlusion and stenosis of carotid artery without mention of cerebral infarction 09/02/2013   Bilateral dry eyes 07/14/2012   Actinic keratoses 05/25/2012   Nuclear cataract, nonsenile 12/25/2011   Other seborrheic keratosis 09/18/2011   History of malignant melanoma of skin 05/11/2011   OBSTRUCTIVE SLEEP APNEA 11/19/2009   SNORING 10/11/2009   Abnormal respiratory rate 10/11/2009   LACERATION OF FINGER 09/03/2009   DYSESTHESIA 05/23/2008   MALIGNANT MELANOMA 01/13/2008   ANXIETY 01/13/2008   VITREOUS HEMORRHAGE 01/13/2008   HTN (hypertension) 01/13/2008   DIVERTICULOSIS OF COLON 01/13/2008   IRRITABLE BOWEL SYNDROME 01/13/2008   CERVICAL SPONDYLOSIS WITHOUT MYELOPATHY 01/13/2008   CHEST PAIN, ATYPICAL 01/13/2008   HYPERCHOLESTEROLEMIA 01/10/2008   GERD 01/10/2008     Past Surgical History   Past Surgical History:  Procedure Laterality Date   anterior  cervical discectomy and fusion  1996   Dr. Sherwood Gambler   arthroscopic shoulder surgery     CESAREAN SECTION     melanoma surgery from ant neck region  1995   skin cancer removed from nose       Home Medications   Prior to Admission medications   Medication Sig Start Date End Date Taking? Authorizing Provider  Alirocumab (PRALUENT) 75 MG/ML SOAJ Inject 1 mL into the skin every 14  (fourteen) days. Patient not taking: Reported on 01/30/2022 01/25/22   Nahser, Wonda Cheng, MD  ALPRAZolam Duanne Moron) 0.25 MG tablet Take 1 tablet by mouth daily as needed.    [provider]  ascorbic acid (VITAMIN C) 500 MG tablet Take 1 tablet (500 mg total) by mouth daily. 05/04/21   Lorella Nimrod, MD  aspirin 81 MG tablet Take 81 mg by mouth daily.    [provider]  b complex vitamins tablet Take 1 tablet by mouth daily.    [provider]  chlorthalidone (HYGROTON) 25 MG tablet Take 0.5 tablets (12.5 mg total) by mouth daily. 01/30/22   Deberah Pelton, NP  Cholecalciferol (VITAMIN D) 2000 UNITS CAPS Take 1 capsule by mouth daily.    [provider]  Coenzyme Q10 (COQ10) 200 MG CAPS Take 1 capsule by mouth daily. Patient not taking: Reported on 01/30/2022    [provider]  cyclobenzaprine (FLEXERIL) 10 MG tablet Take 10 mg by mouth daily as needed. 12/08/21   [provider]  DIGEST ENZYMES-ANTICHOLINERGIC PO Take 1 capsule by mouth 3 (three) times daily. PRN Patient not taking: Reported on 01/30/2022    [provider]  losartan (COZAAR) 100 MG tablet Take 100 mg by mouth daily. 10/07/21   [provider]  metoprolol succinate (TOPROL-XL) 25 MG 24 hr tablet Take 25 mg by mouth daily.    [provider]  Misc Natural Products (TART CHERRY ADVANCED) CAPS Take 2 capsules by mouth daily.    [provider]  Multiple Vitamins-Minerals (MULTIVITAMIN & MINERAL PO) Take 1 tablet by mouth daily.    [provider]  Omega-3 Fatty Acids (FISH OIL PO) Take 1 capsule by mouth daily.    [provider]  omeprazole (PRILOSEC) 40 MG capsule 1 capsule 30 minutes before morning meal Orally Once a day for cough/hoarseness/acid reflux for 30 day(s) Patient not taking: Reported on 01/30/2022    [provider]  Red Yeast Rice 600 MG CAPS Take 2 capsules by mouth daily.    [provider]   rosuvastatin (CRESTOR) 5 MG tablet Take 5 mg by mouth once a week. 12/27/20   [provider]  zinc sulfate 220 (50 Zn) MG capsule Take 1 capsule by mouth daily.    [provider]     Allergies  Hctz [hydrochlorothiazide], Codeine, Hydrocodone-acetaminophen, Lisinopril, Neomycin, Other, Oxycodone, Pravastatin sodium, and Ciprofloxacin   Family History   Family History  Problem Relation Age of Onset   Diabetes Mother    Heart disease Mother    Hyperlipidemia Mother    Hypertension Mother    Cancer Father    Deep vein thrombosis Father    Heart attack Father    Cancer Brother      Physical Exam  Triage Vital Signs: ED Triage Vitals  Enc Vitals Group     BP 01/31/22 1907 (!) 172/86     Pulse Rate 01/31/22 1907 (!) 54     Resp 01/31/22 1907 20  Temp 01/31/22 1907 97.6 F (36.4 C)     Temp Source 01/31/22 1907 Oral     SpO2 01/31/22 1907 97 %     Weight 01/31/22 1908 140 lb (63.5 kg)     Height 01/31/22 1908 5' (1.524 m)     Head Circumference --      Peak Flow --      Pain Score 01/31/22 1908 0     Pain Loc --      Pain Edu? --      Excl. in [place]? --     Updated Vital Signs: BP (!) 160/65   Pulse (!) 57   Temp 97.9 F (36.6 C) (Oral)   Resp 18   Ht 5' (1.524 m)   Wt 63.5 kg   SpO2 98%   BMI 27.34 kg/m    General: Awake, no distress.  CV:  Mild bradycardia.  Good peripheral perfusion.  Resp:  Normal effort.  CTA B. Abd:  Nontender.  No distention.  Other:  No carotid bruits.  Alert and oriented x3.  CN II-XII grossly intact.  5/5 motor strength and sensation all extremities. MAEx4.   ED Results / Procedures / Treatments  Labs (all labs ordered are listed, but only abnormal results are displayed) Labs Reviewed  BASIC METABOLIC PANEL - Abnormal; Notable for the following components:      Result Value   Sodium 129 (*)    Chloride 94 (*)    Glucose, Bld 106 (*)    All other components within normal limits  URINALYSIS, ROUTINE W  REFLEX MICROSCOPIC - Abnormal; Notable for the following components:   Color, Urine YELLOW (*)    APPearance HAZY (*)    Ketones, ur 5 (*)    Leukocytes,Ua SMALL (*)    All other components within normal limits  CBC  D-DIMER, QUANTITATIVE  TROPONIN I (HIGH SENSITIVITY)  TROPONIN I (HIGH SENSITIVITY)     EKG  ED ECG REPORT I, Quame Spratlin J, the attending physician, personally viewed and interpreted this ECG.   Date: 02/01/2022  EKG Time: 1918  Rate: 58  Rhythm: sinus bradycardia  Axis: Normal  Intervals:none  ST&T Change: Nonspecific    RADIOLOGY I have independently visualized and interpreted patient's chest x-ray and CT head as well as noted the radiology interpretation:  Chest x-ray: No acute cardiopulmonary process  CT head: No acute ICH  Official radiology report(s): CT Head Wo Contrast  Result Date: 02/01/2022 CLINICAL DATA:  Initial evaluation for syncope/presyncope. CVA suspected. EXAM: CT HEAD WITHOUT CONTRAST TECHNIQUE: Contiguous axial images were obtained from the base of the skull through the vertex without intravenous contrast. RADIATION DOSE REDUCTION: This exam was performed according to the departmental dose-optimization program which includes automated exposure control, adjustment of the mA and/or kV according to patient size and/or use of iterative reconstruction technique. COMPARISON:  Prior study from 11/17/2021. FINDINGS: Brain: Cerebral volume within normal limits for age. Patchy hypodensity involving the supratentorial cerebral white matter, consistent with chronic small vessel ischemic disease. No acute intracranial hemorrhage. No acute large vessel territory infarct. No mass lesion or midline shift. No hydrocephalus or extra-axial fluid collection. Vascular: No abnormal hyperdense vessel. Scattered vascular calcifications noted within the carotid siphons. Skull: Scalp soft tissues and calvarium demonstrate no acute finding. Sinuses/Orbits: Globes orbital  soft tissues within normal limits. Small volume layering secretions noted within the right sphenoid sinus. Paranasal sinuses are otherwise largely clear. No mastoid effusion. Other: None. IMPRESSION: 1. No acute intracranial abnormality. 2. Mild  to moderate chronic microvascular ischemic disease. Electronically Signed   By: Jeannine Boga M.D.   On: 02/01/2022 00:18   DG Chest Port 1 View  Result Date: 01/31/2022 CLINICAL DATA:  Shortness of breath. EXAM: PORTABLE CHEST 1 VIEW COMPARISON:  Radiograph 04/28/2021, cardiac CT 12/12/2021 FINDINGS: The cardiomediastinal contours are normal. Mitral annulus calcifications. The lungs are clear. Pulmonary vasculature is normal. No consolidation, pleural effusion, or pneumothorax. No acute osseous abnormalities are seen. IMPRESSION: No acute chest findings. Electronically Signed   By: Keith Rake M.D.   On: 01/31/2022 20:10     PROCEDURES:  Critical Care performed: No  .1-3 Lead EKG Interpretation  Performed by: Paulette Blanch, MD Authorized by: Paulette Blanch, MD     Interpretation: abnormal     ECG rate:  53   ECG rate assessment: bradycardic     Rhythm: sinus bradycardia     Ectopy: none     Conduction: normal   Comments:     Patient placed on cardiac monitor to evaluate for arrhythmias    MEDICATIONS ORDERED IN ED: Medications  fosfomycin (MONUROL) packet 3 g (has no administration in time range)  sodium chloride 0.9 % bolus 1,000 mL (1,000 mLs Intravenous New Bag/Given 02/01/22 0000)     IMPRESSION / MDM / ASSESSMENT AND PLAN / ED COURSE  I reviewed the triage vital signs and the nursing notes.                             76 year old female presenting for completion of syncopal evaluation from 01/29/2022.  Frontal diagnosis includes but is not limited to [place], CVA, ACS, metabolic, infectious etiologies, etc.  I have personally reviewed patient's ED lab work from 01/29/2022 as well as her cardiology office visit from yesterday and  also her pharmacy telephone communication from today.  Patient's presentation is most consistent with acute presentation with potential threat to life or bodily function.  The patient is on the cardiac monitor to evaluate for evidence of arrhythmia and/or significant heart rate changes.  Laboratory results demonstrate normal H/H 13/42, stable hyponatremia with sodium 129, initial troponin and chest x-ray negative.  Will obtain repeat troponin, orthostatic vital signs, infuse 1 L IV normal saline for hyponatremia.  Given patient had COVID-19 infection back in the spring and complaints of shortness of breath, will check D-dimer.  Will obtain CT head given recent syncopal episode.  Will reassess.  Clinical Course as of 02/01/22 0115  Wed Feb 01, 2022  0114 Updated patient and spouse on repeat negative troponin, negative CT head and negative D-dimer.  Will treat with fosfomycin for mild UTI.  Patient feeling fine and eager to go home.  Strict return precautions given.  Both verbalized understanding agree with plan of care. [JS]    Clinical Course User Index [JS] Paulette Blanch, MD     FINAL CLINICAL IMPRESSION(S) / ED DIAGNOSES   Final diagnoses:  Hyponatremia  Shortness of breath  Lower urinary tract infectious disease     Rx / DC Orders   ED Discharge Orders     None        Note:  This document was prepared using Dragon voice recognition software and may include unintentional dictation errors.   Paulette Blanch, MD 02/01/22 [phone]

## 2022-01-31 NOTE — Telephone Encounter (Signed)
Advised patient to drink fluids with sodium such as gatorade

## 2022-01-31 NOTE — Telephone Encounter (Signed)
Spoke with patient. Had syncopal episode over the weekend. Transported to ER and left without being seen. Had labs checked. Sodium decreased to 128.    Had instructed patient to take 1/2 tablet chlorthalidone daily and then recheck BMP. Patient misunderstood and had taken a whole tablet for 3 days. Will hold chlorthalidone and recheck BMP on Friday.

## 2022-01-31 NOTE — ED Provider Notes (Incomplete)
[facility] Provider Note    Event Date/Time   First MD Initiated Contact with Patient 01/31/22 2328     (approximate)   History   Shortness of Breath and Loss of Consciousness   HPI  Wendy [name] is a 76 y.o. female who presents to the ED from home for completion of syncope work-up.  Patient left without being seen on 12/3 after syncopal episode while attending a funeral.  She describes feeling very hot during the funeral, felt nauseous and had a brief syncopal episode.  She had labs drawn at triage but left without being seen.  She did follow-up with her cardiologist the following day who changed some of her antihypertensive medications and gave her 1 L IV fluids due to persistent hyponatremia.  She felt better after the fluids and went home.  In the meantime she discovered that she had been taking her chlorthalidone wrong; had been taking a whole tablet x3 days instead of 1/2 tablet.  Presents tonight because she had an episode of feeling short of breath.  Endorses COVID-19 infection back in March of this year.  Denies associated fever/chills, cough, chest pain, abdominal pain, nausea, vomiting, dysuria or diarrhea.     Past Medical History   Past Medical History:  Diagnosis Date  . Anxiety   . Atypical chest pain   . B12 deficiency   . Cervical spondylosis without myelopathy   . Diverticulosis of colon   . Dysesthesia   . GERD (gastroesophageal reflux disease)   . Hypercholesteremia   . Hypertension   . IBS (irritable bowel syndrome)   . Malignant melanoma ([place])   . Vitreous hemorrhage [facility])      Active Problem List   Patient Active Problem List   Diagnosis Date Noted  . Backache 01/25/2022  . Prolapse of female genital organs 01/25/2022  . Personal history of colonic polyps 01/25/2022  . External hemorrhoids 01/25/2022  . 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 Chronic cystitis 01/18/2022  . Degeneration of  lumbar intervertebral disc 01/18/2022  . Lumbar spondylosis 01/18/2022  . Lumbar radiculopathy 01/18/2022  . Constipation 01/18/2022  . Myalgia due to statin 01/18/2022  . Aortic atherosclerosis ([place]) 01/18/2022  . Hyponatremia 04/30/2021  . Weakness 04/30/2021  . COVID-19 virus infection 04/30/2021  . Pain in wrist 01/16/2019  . Neck pain 01/10/2019  . Impacted cerumen of both ears 08/07/2018  . Eustachian tube dysfunction, bilateral 08/07/2018  . Mixed hyperlipidemia 03/30/2016  . Vaginitis, atrophic 05/26/2015  . Carotid artery disease ([place]) 02/15/2015  . Voiding dysfunction 12/11/2014  . Third degree hemorrhoids 10/13/2014  . Procidentia of uterus 09/23/2014  . Occlusion and stenosis of carotid artery without mention of cerebral infarction 09/02/2013  . Bilateral dry eyes 07/14/2012  . Actinic keratoses 05/25/2012  . Nuclear cataract, nonsenile 12/25/2011  . Other seborrheic keratosis 09/18/2011  . History of malignant melanoma of skin 05/11/2011  . OBSTRUCTIVE SLEEP APNEA 11/19/2009  . SNORING 10/11/2009  . Abnormal respiratory rate 10/11/2009  . LACERATION OF FINGER 09/03/2009  . DYSESTHESIA 05/23/2008  . MALIGNANT MELANOMA 01/13/2008  . ANXIETY 01/13/2008  . VITREOUS HEMORRHAGE 01/13/2008  . HTN (hypertension) 01/13/2008  . DIVERTICULOSIS OF COLON 01/13/2008  . IRRITABLE BOWEL SYNDROME 01/13/2008  . CERVICAL SPONDYLOSIS WITHOUT MYELOPATHY 01/13/2008  . CHEST PAIN, ATYPICAL 01/13/2008  . HYPERCHOLESTEROLEMIA 01/10/2008  . GERD 01/10/2008     Past Surgical History   Past Surgical History:  Procedure Laterality Date  . anterior  cervical discectomy and fusion  1996   Dr. Sherwood Gambler  . arthroscopic shoulder surgery    . CESAREAN SECTION    . melanoma surgery from ant neck region  1995  . skin cancer removed from nose       Home Medications   Prior to Admission medications   Medication Sig Start Date End Date Taking? Authorizing Provider  Alirocumab  (PRALUENT) 75 MG/ML SOAJ Inject 1 mL into the skin every 14 (fourteen) days. Patient not taking: Reported on 01/30/2022 01/25/22   Nahser, Wonda Cheng, MD  ALPRAZolam Duanne Moron) 0.25 MG tablet Take 1 tablet by mouth daily as needed.    [provider]  ascorbic acid (VITAMIN C) 500 MG tablet Take 1 tablet (500 mg total) by mouth daily. 05/04/21   Lorella Nimrod, MD  aspirin 81 MG tablet Take 81 mg by mouth daily.    [provider]  b complex vitamins tablet Take 1 tablet by mouth daily.    [provider]  chlorthalidone (HYGROTON) 25 MG tablet Take 0.5 tablets (12.5 mg total) by mouth daily. 01/30/22   Deberah Pelton, NP  Cholecalciferol (VITAMIN D) 2000 UNITS CAPS Take 1 capsule by mouth daily.    [provider]  Coenzyme Q10 (COQ10) 200 MG CAPS Take 1 capsule by mouth daily. Patient not taking: Reported on 01/30/2022    [provider]  cyclobenzaprine (FLEXERIL) 10 MG tablet Take 10 mg by mouth daily as needed. 12/08/21   [provider]  DIGEST ENZYMES-ANTICHOLINERGIC PO Take 1 capsule by mouth 3 (three) times daily. PRN Patient not taking: Reported on 01/30/2022    [provider]  losartan (COZAAR) 100 MG tablet Take 100 mg by mouth daily. 10/07/21   [provider]  metoprolol succinate (TOPROL-XL) 25 MG 24 hr tablet Take 25 mg by mouth daily.    [provider]  Misc Natural Products (TART CHERRY ADVANCED) CAPS Take 2 capsules by mouth daily.    [provider]  Multiple Vitamins-Minerals (MULTIVITAMIN & MINERAL PO) Take 1 tablet by mouth daily.    [provider]  Omega-3 Fatty Acids (FISH OIL PO) Take 1 capsule by mouth daily.    [provider]  omeprazole (PRILOSEC) 40 MG capsule 1 capsule 30 minutes before morning meal Orally Once a day for cough/hoarseness/acid reflux for 30 day(s) Patient not taking: Reported on 01/30/2022    [provider]  Red Yeast Rice 600 MG CAPS Take 2  capsules by mouth daily.    [provider]  rosuvastatin (CRESTOR) 5 MG tablet Take 5 mg by mouth once a week. 12/27/20   [provider]  zinc sulfate 220 (50 Zn) MG capsule Take 1 capsule by mouth daily.    [provider]     Allergies  Hctz [hydrochlorothiazide], Codeine, Hydrocodone-acetaminophen, Lisinopril, Neomycin, Other, Oxycodone, Pravastatin sodium, and Ciprofloxacin   Family History   Family History  Problem Relation Age of Onset  . Diabetes Mother   . Heart disease Mother   . Hyperlipidemia Mother   . Hypertension Mother   . Cancer Father   . Deep vein thrombosis Father   . Heart attack Father   . Cancer Brother      Physical Exam  Triage Vital Signs: ED Triage Vitals  Enc Vitals Group     BP 01/31/22 1907 (!) 172/86     Pulse Rate 01/31/22 1907 (!) 54     Resp 01/31/22 1907 20  Temp 01/31/22 1907 97.6 F (36.4 C)     Temp Source 01/31/22 1907 Oral     SpO2 01/31/22 1907 97 %     Weight 01/31/22 1908 140 lb (63.5 kg)     Height 01/31/22 1908 5' (1.524 m)     Head Circumference --      Peak Flow --      Pain Score 01/31/22 1908 0     Pain Loc --      Pain Edu? --      Excl. in [place]? --     Updated Vital Signs: BP (!) 165/85   Pulse (!) 51   Temp 97.9 F (36.6 C) (Oral)   Resp 18   Ht 5' (1.524 m)   Wt 63.5 kg   SpO2 97%   BMI 27.34 kg/m    General: Awake, no distress.  CV:  Mild bradycardia.  Good peripheral perfusion.  Resp:  Normal effort.  CTA B. Abd:  Nontender.  No distention.  Other:  No carotid bruits.  Alert and oriented x3.  CN II-XII grossly intact.  5/5 motor strength and sensation all extremities. MAEx4.   ED Results / Procedures / Treatments  Labs (all labs ordered are listed, but only abnormal results are displayed) Labs Reviewed  BASIC METABOLIC PANEL - Abnormal; Notable for the following components:      Result Value   Sodium 129 (*)    Chloride 94 (*)    Glucose, Bld 106 (*)    All  other components within normal limits  CBC  D-DIMER, QUANTITATIVE  TROPONIN I (HIGH SENSITIVITY)  TROPONIN I (HIGH SENSITIVITY)     EKG  ED ECG REPORT I, Essance Gatti J, the attending physician, personally viewed and interpreted this ECG.   Date: 02/01/2022  EKG Time: 1918  Rate: 58  Rhythm: sinus bradycardia  Axis: Normal  Intervals:none  ST&T Change: Nonspecific    RADIOLOGY I have independently visualized and interpreted patient's chest x-ray and CT head as well as noted the radiology interpretation:  Chest x-ray: No acute cardiopulmonary process  CT head:  Official radiology report(s): DG Chest Port 1 View  Result Date: 01/31/2022 CLINICAL DATA:  Shortness of breath. EXAM: PORTABLE CHEST 1 VIEW COMPARISON:  Radiograph 04/28/2021, cardiac CT 12/12/2021 FINDINGS: The cardiomediastinal contours are normal. Mitral annulus calcifications. The lungs are clear. Pulmonary vasculature is normal. No consolidation, pleural effusion, or pneumothorax. No acute osseous abnormalities are seen. IMPRESSION: No acute chest findings. Electronically Signed   By: Keith Rake M.D.   On: 01/31/2022 20:10     PROCEDURES:  Critical Care performed: {CriticalCareYesNo:19197::"Yes, see critical care procedure note(s)","No"}  .1-3 Lead EKG Interpretation  Performed by: Paulette Blanch, MD Authorized by: Paulette Blanch, MD     Interpretation: abnormal     ECG rate:  53   ECG rate assessment: bradycardic     Rhythm: sinus bradycardia     Ectopy: none     Conduction: normal   Comments:     Patient placed on cardiac monitor to evaluate for arrhythmias    MEDICATIONS ORDERED IN ED: Medications  sodium chloride 0.9 % bolus 1,000 mL (has no administration in time range)     IMPRESSION / MDM / ASSESSMENT AND PLAN / ED COURSE  I reviewed the triage vital signs and the nursing notes.  76 year old female presenting for completion of syncopal evaluation from 12/3  2023.  Patient's presentation is most consistent with {EM COPA:27473}  {If the patient is on the monitor, remove the brackets and asterisks on the sentence below and remember to document it as a Procedure as well. Otherwise delete the sentence below:1} {**The patient is on the cardiac monitor to evaluate for evidence of arrhythmia and/or significant heart rate changes.**}  {Remember to include, when applicable, any/all of the following data: independent review of imaging independent review of labs (comment specifically on pertinent positives and negatives) review of specific prior hospitalizations, PCP/specialist notes, etc. discuss meds given and prescribed document any discussion with consultants (including hospitalists) any clinical decision tools you used and why (PECARN, NEXUS, etc.) did you consider admitting the patient? document social determinants of health affecting patient's care (homelessness, inability to follow up in a timely fashion, etc) document any pre-existing conditions increasing risk on current visit (e.g. diabetes and HTN increasing danger of high-risk chest pain/ACS) describes what meds you gave (especially parenteral) and why any other interventions?:1}      FINAL CLINICAL IMPRESSION(S) / ED DIAGNOSES   Final diagnoses:  Hyponatremia  Shortness of breath     Rx / DC Orders   ED Discharge Orders     None        Note:  This document was prepared using Dragon voice recognition software and may include unintentional dictation errors.

## 2022-01-31 NOTE — ED Provider Triage Note (Signed)
Emergency Medicine Provider Triage Evaluation Note  Wendy [name] , a 76 y.o. female  was evaluated in triage.  Pt complains of shortness of breath. Known hyponatremia. She was here on 12/3 but after syncopal episode, but didn't stay for complete evaluation due to wait time.  Since then she has had no syncopal episodes but has felt lightheaded.  She denies headache, cough, fever, chills, chest pain, or shortness of breath.  Physical Exam  BP (!) 172/86 (BP Location: Left Arm)   Pulse (!) 54   Temp 97.6 F (36.4 C) (Oral)   Resp 20   Ht 5' (1.524 m)   Wt 63.5 kg   SpO2 97%   BMI 27.34 kg/m  Gen:   Awake, no distress   Resp:  Normal effort  MSK:   Moves extremities without difficulty  Other:    Medical Decision Making  Medically screening exam initiated at 7:18 PM.  Appropriate orders placed.  Wendy [name] was informed that the remainder of the evaluation will be completed by another provider, this initial triage assessment does not replace that evaluation, and the importance of remaining in the ED until their evaluation is complete.     Wendy Dike, FNP 01/31/22 2021

## 2022-01-31 NOTE — ED Triage Notes (Signed)
Pt returns to ED to complete evaluation. Was seen on 12/3 for syncope but didn't stay to complete evaluation. Since then s/s have improved and continue to feel SOB and "like I'm going to pass out."  No new syncope episodes. [place] well. No cough/fever/chills. No CP.

## 2022-01-31 NOTE — ED Notes (Signed)
Repeat troponin sent at this time

## 2022-02-01 ENCOUNTER — Emergency Department: Payer: Medicare Other

## 2022-02-01 DIAGNOSIS — E871 Hypo-osmolality and hyponatremia: Secondary | ICD-10-CM | POA: Diagnosis not present

## 2022-02-01 DIAGNOSIS — R55 Syncope and collapse: Secondary | ICD-10-CM | POA: Diagnosis not present

## 2022-02-01 DIAGNOSIS — I6529 Occlusion and stenosis of unspecified carotid artery: Secondary | ICD-10-CM | POA: Diagnosis not present

## 2022-02-01 DIAGNOSIS — I6782 Cerebral ischemia: Secondary | ICD-10-CM | POA: Diagnosis not present

## 2022-02-01 LAB — TROPONIN I (HIGH SENSITIVITY): Troponin I (High Sensitivity): 6 ng/L (ref <18)

## 2022-02-01 LAB — URINALYSIS, ROUTINE W REFLEX MICROSCOPIC
Bacteria, UA: NONE SEEN
Bilirubin Urine: NEGATIVE
Glucose, UA: NEGATIVE mg/dL
Hgb urine dipstick: NEGATIVE
Ketones, ur: 5 mg/dL — AB
Nitrite: NEGATIVE
Protein, ur: NEGATIVE mg/dL
Specific Gravity, Urine: 1.015 (ref 1.005–1.030)
pH: 6 (ref 5.0–8.0)

## 2022-02-01 LAB — D-DIMER, QUANTITATIVE: D-Dimer, Quant: 0.46 ug/mL-FEU (ref 0.00–0.50)

## 2022-02-01 MED ORDER — FOSFOMYCIN TROMETHAMINE 3 G PO PACK
3.0000 g | PACK | Freq: Once | ORAL | Status: AC
  Administered 2022-02-01: 3 g via ORAL
  Filled 2022-02-01: qty 3

## 2022-02-01 NOTE — Discharge Instructions (Signed)
Drink plenty of fluids daily.  Return to the ER for worsening symptoms, persistent vomiting, difficulty breathing or other concerns. °

## 2022-02-01 NOTE — ED Notes (Signed)
ED Provider at bedside for re-eval

## 2022-02-01 NOTE — ED Notes (Signed)
Pt to CT

## 2022-02-03 ENCOUNTER — Telehealth: Payer: Self-pay | Admitting: Pharmacist

## 2022-02-03 ENCOUNTER — Other Ambulatory Visit: Payer: Self-pay

## 2022-02-03 DIAGNOSIS — I10 Essential (primary) hypertension: Secondary | ICD-10-CM

## 2022-02-03 DIAGNOSIS — E871 Hypo-osmolality and hyponatremia: Secondary | ICD-10-CM | POA: Diagnosis not present

## 2022-02-03 NOTE — Telephone Encounter (Signed)
Patient called back. Felt SOB so went back to ER. Tests normal.  Advised to restart hydralazine '10mg'$  TID. Lab work pending.

## 2022-02-03 NOTE — Addendum Note (Signed)
Addended by: Rollen Sox on: 02/03/2022 05:12 PM   Modules accepted: Orders

## 2022-02-03 NOTE — Telephone Encounter (Signed)
Attempted to call patient. LMOM

## 2022-02-04 DIAGNOSIS — R55 Syncope and collapse: Secondary | ICD-10-CM | POA: Diagnosis not present

## 2022-02-04 LAB — BASIC METABOLIC PANEL
BUN/Creatinine Ratio: 14 (ref 12–28)
BUN: 12 mg/dL (ref 8–27)
CO2: 24 mmol/L (ref 20–29)
Calcium: 10 mg/dL (ref 8.7–10.3)
Chloride: 97 mmol/L (ref 96–106)
Creatinine, Ser: 0.88 mg/dL (ref 0.57–1.00)
Glucose: 92 mg/dL (ref 70–99)
Potassium: 5.1 mmol/L (ref 3.5–5.2)
Sodium: 134 mmol/L (ref 134–144)
eGFR: 68 mL/min/{1.73_m2} (ref >59)

## 2022-02-07 ENCOUNTER — Telehealth: Payer: Self-pay | Admitting: Cardiovascular Disease

## 2022-02-07 DIAGNOSIS — C44321 Squamous cell carcinoma of skin of nose: Secondary | ICD-10-CM | POA: Diagnosis not present

## 2022-02-07 DIAGNOSIS — Z85828 Personal history of other malignant neoplasm of skin: Secondary | ICD-10-CM | POA: Diagnosis not present

## 2022-02-07 DIAGNOSIS — L821 Other seborrheic keratosis: Secondary | ICD-10-CM | POA: Diagnosis not present

## 2022-02-07 DIAGNOSIS — Z85820 Personal history of malignant melanoma of skin: Secondary | ICD-10-CM | POA: Diagnosis not present

## 2022-02-07 NOTE — Telephone Encounter (Signed)
Patient assumes she has a UTI. She would like to know if it would be alright to bring a sample by the office this afternoon. Please advise.

## 2022-02-09 ENCOUNTER — Telehealth: Payer: Self-pay | Admitting: Pharmacist

## 2022-02-09 DIAGNOSIS — I10 Essential (primary) hypertension: Secondary | ICD-10-CM

## 2022-02-09 NOTE — Telephone Encounter (Signed)
Spoke with patient. Lab work returned to normal. Systolic BP remains in 639E. Will restart spironolactone at this time and recheck Bmp in 1 week

## 2022-02-10 NOTE — Telephone Encounter (Signed)
Returned call to patient, left message d/t no answer. Advised that UTI seems to be something that should go to PCP to address, but that if she still feels she needs [place], to call back or let us know.

## 2022-02-16 ENCOUNTER — Other Ambulatory Visit: Payer: Self-pay

## 2022-02-16 DIAGNOSIS — E785 Hyperlipidemia, unspecified: Secondary | ICD-10-CM | POA: Diagnosis not present

## 2022-02-16 DIAGNOSIS — E7800 Pure hypercholesterolemia, unspecified: Secondary | ICD-10-CM

## 2022-02-16 DIAGNOSIS — I700 Atherosclerosis of aorta: Secondary | ICD-10-CM | POA: Diagnosis not present

## 2022-02-16 DIAGNOSIS — I6523 Occlusion and stenosis of bilateral carotid arteries: Secondary | ICD-10-CM | POA: Diagnosis not present

## 2022-02-16 DIAGNOSIS — I10 Essential (primary) hypertension: Secondary | ICD-10-CM | POA: Diagnosis not present

## 2022-02-16 DIAGNOSIS — E871 Hypo-osmolality and hyponatremia: Secondary | ICD-10-CM | POA: Diagnosis not present

## 2022-02-16 DIAGNOSIS — R350 Frequency of micturition: Secondary | ICD-10-CM | POA: Diagnosis not present

## 2022-02-16 LAB — BASIC METABOLIC PANEL
BUN/Creatinine Ratio: 16 (ref 12–28)
BUN: 14 mg/dL (ref 8–27)
CO2: 25 mmol/L (ref 20–29)
Calcium: 9.7 mg/dL (ref 8.7–10.3)
Chloride: 96 mmol/L (ref 96–106)
Creatinine, Ser: 0.89 mg/dL (ref 0.57–1.00)
Glucose: 100 mg/dL — ABNORMAL HIGH (ref 70–99)
Potassium: 4.6 mmol/L (ref 3.5–5.2)
Sodium: 135 mmol/L (ref 134–144)
eGFR: 67 mL/min/{1.73_m2} (ref >59)

## 2022-02-16 LAB — LIPID PANEL
Chol/HDL Ratio: 3.6 ratio (ref 0.0–4.4)
Cholesterol, Total: 203 mg/dL — ABNORMAL HIGH (ref 100–199)
HDL: 56 mg/dL (ref >39)
LDL Chol Calc (NIH): 115 mg/dL — ABNORMAL HIGH (ref 0–99)
Triglycerides: 185 mg/dL — ABNORMAL HIGH (ref 0–149)
VLDL Cholesterol Cal: 32 mg/dL (ref 5–40)

## 2022-02-17 ENCOUNTER — Telehealth: Payer: Self-pay | Admitting: Pharmacist

## 2022-02-17 DIAGNOSIS — I10 Essential (primary) hypertension: Secondary | ICD-10-CM

## 2022-02-17 MED ORDER — SPIRONOLACTONE 25 MG PO TABS: 25.0000 mg | ORAL_TABLET | Freq: Every day | ORAL | 3 refills | Status: DC

## 2022-02-17 NOTE — Telephone Encounter (Signed)
Spoke with patient regarding lab work. Na and K level normals. LDL remains elevated however she has not started PCSK9i yet due to insurance issues.   Will increase spironolactone to '25mg'$  and recheck lab work at follow up on 1/2.

## 2022-02-19 DIAGNOSIS — R55 Syncope and collapse: Secondary | ICD-10-CM | POA: Diagnosis not present

## 2022-02-21 ENCOUNTER — Telehealth: Payer: Self-pay | Admitting: Pharmacist

## 2022-02-21 ENCOUNTER — Encounter: Payer: Self-pay | Admitting: Pharmacist

## 2022-02-21 DIAGNOSIS — I10 Essential (primary) hypertension: Secondary | ICD-10-CM

## 2022-02-21 MED ORDER — METOPROLOL SUCCINATE ER 25 MG PO TB24: 25.0000 mg | ORAL_TABLET | Freq: Every day | ORAL | 1 refills | Status: DC

## 2022-02-21 NOTE — Telephone Encounter (Signed)
Patient called. Needs updated Toprol rx sent to [facility].

## 2022-02-22 DIAGNOSIS — C44321 Squamous cell carcinoma of skin of nose: Secondary | ICD-10-CM | POA: Diagnosis not present

## 2022-02-22 DIAGNOSIS — Z85820 Personal history of malignant melanoma of skin: Secondary | ICD-10-CM | POA: Diagnosis not present

## 2022-02-22 DIAGNOSIS — H6123 Impacted cerumen, bilateral: Secondary | ICD-10-CM | POA: Diagnosis not present

## 2022-02-22 DIAGNOSIS — Z85828 Personal history of other malignant neoplasm of skin: Secondary | ICD-10-CM | POA: Diagnosis not present

## 2022-02-22 DIAGNOSIS — H902 Conductive hearing loss, unspecified: Secondary | ICD-10-CM | POA: Diagnosis not present

## 2022-02-23 NOTE — Progress Notes (Signed)
   Histology and Location of Primary Skin Cancer:  Squamous Cell Carcinoma Right Nasal Tip    Wendy [name] presented with the following signs/symptoms: rash/lesion on nose   Past/Anticipated interventions by patient's surgeon/dermatologist for current problematic lesion, if any:     Past skin cancers, if any:   History of Blistering sunburns, if any:    SAFETY ISSUES: Prior radiation? none Pacemaker/ICD? no Possible current pregnancy? no Is the patient on methotrexate? no  Current Complaints / other details:  wants to know details  Vitals:   03/01/22 0940  BP: (!) 149/77  Pulse: (!) 58  Resp: 18  Temp: (!) 97.3 F (36.3 C)  SpO2: 100%

## 2022-02-28 ENCOUNTER — Ambulatory Visit: Payer: Medicare Other | Attending: Internal Medicine | Admitting: Pharmacist

## 2022-02-28 ENCOUNTER — Encounter: Payer: Self-pay | Admitting: Pharmacist

## 2022-02-28 VITALS — BP 165/80 | HR 53

## 2022-02-28 DIAGNOSIS — I10 Essential (primary) hypertension: Secondary | ICD-10-CM

## 2022-02-28 NOTE — Progress Notes (Signed)
Patient ID: Wendy [name]                 DOB: [birth year]                      MRN: [id number]     HPI: Wendy [name] is a 77 y.o. female referred by to HTN clinic. PMH is significant for HTN, CAD, aortic atherosclerosis and anxiety. Patient seen last month for lipid management but brought up concerns regarding her BP.  Have discussed BP management with patient over phone and readings remain elevated. Can not take thiazide diuretics due to hyponatremia. Amlodipine caused edema and "hand tightness."  Currently managed on losartan 100mg , metoprolol succinate 25mg  daily, and spironolactone 25mg  daily. [name] increased last week, has BMP scheduled for today.  Reports she has not been walking as much since the weather has been colder. Has appointment tomorrow for squamous cell carcinoma consult.  Has been hesitant to take hydralazine but she reports when it does it has been effective.  Current HTN meds:  Hydralazine 10mg  BID Losartan 100mg  daily Spironolactone 25mg  daily Hydralazine 10mg  prn  BP goal: <130/80  Wt Readings from Last 3 Encounters:  12/19/21 144 lb 3.2 oz (65.4 kg)  11/15/21 143 lb (64.9 kg)  04/30/21 140 lb (63.5 kg)   BP Readings from Last 3 Encounters:  01/25/22 (!) 161/94  01/18/22 (!) 167/76  12/19/21 128/80   Pulse Readings from Last 3 Encounters:  01/25/22 61  12/19/21 62  11/15/21 (!) 59    Renal function: CrCl cannot be calculated (Patient's most recent lab result is older than the maximum 21 days allowed.).  Past Medical History:  Diagnosis Date   Anxiety    Atypical chest pain    B12 deficiency    Cervical spondylosis without myelopathy    Diverticulosis of colon    Dysesthesia    GERD (gastroesophageal reflux disease)    Hypercholesteremia    Hypertension    IBS (irritable bowel syndrome)    Malignant melanoma ([facility])    Vitreous hemorrhage ([facility])     Current Outpatient Medications on File Prior to Visit  Medication Sig Dispense Refill    ALPRAZolam (XANAX) 0.25 MG tablet Take 1 tablet by mouth daily as needed.     ascorbic acid (VITAMIN C) 500 MG tablet Take 1 tablet (500 mg total) by mouth daily. 90 tablet 0   aspirin 81 MG tablet Take 81 mg by mouth daily.     b complex vitamins tablet Take 1 tablet by mouth daily.     Cholecalciferol (VITAMIN D) 2000 UNITS CAPS Take 1 capsule by mouth daily.     Coenzyme Q10 (COQ10) 200 MG CAPS Take 1 capsule by mouth daily.     cyclobenzaprine (FLEXERIL) 10 MG tablet Take 10 mg by mouth daily as needed.     DIGEST ENZYMES-ANTICHOLINERGIC PO Take 1 capsule by mouth 3 (three) times daily. PRN     hydrALAZINE (APRESOLINE) 10 MG tablet Take 1 tablet (10 mg total) by mouth 2 (two) times daily. 180 tablet 3   losartan (COZAAR) 100 MG tablet Take 100 mg by mouth daily.     metoprolol succinate (TOPROL-XL) 25 MG 24 hr tablet Take 25 mg by mouth daily.     Misc Natural Products (TART CHERRY ADVANCED) CAPS Take 2 capsules by mouth daily.     Multiple Vitamins-Minerals (MULTIVITAMIN & MINERAL PO) Take 1 tablet by mouth daily.     Omega-3 Fatty Acids (  FISH OIL PO) Take 1 capsule by mouth daily.     omeprazole (PRILOSEC) 40 MG capsule 1 capsule 30 minutes before morning meal Orally Once a day for cough/hoarseness/acid reflux for 30 day(s)     Red Yeast Rice 600 MG CAPS Take 2 capsules by mouth daily.     rosuvastatin (CRESTOR) 5 MG tablet Take 5 mg by mouth once a week.     zinc sulfate 220 (50 Zn) MG capsule Take 1 capsule by mouth daily.     No current facility-administered medications on file prior to visit.    Allergies  Allergen Reactions   Hctz [Hydrochlorothiazide] Other (See Comments)    hyponatremia   Codeine Nausea Only and Nausea And Vomiting   Hydrocodone-Acetaminophen Nausea And Vomiting   Lisinopril Other (See Comments)    REACTION: INTOL to Prinivil w/ pain on urination    Neomycin Dermatitis   Other Nausea Only    fragrance   Oxycodone Nausea And Vomiting   Pravastatin  Sodium Other (See Comments)    REACTION: INTOL to Pravachol w/ myalgias   Ciprofloxacin Other (See Comments)     Assessment/Plan:  1. Hypertension -   HYPERTENSION CONTROL Vitals:   02/28/22 1409 02/28/22 1628  BP: (!) 167/95 (!) 165/80    The patient's blood pressure is elevated above target today.  In order to address the patient's elevated BP: A referral to the [facility] will be placed.   Patient BP in room 167/95 which is above goal of <130/80. Patient anxious regarding oncology appointment tomorrow. Has BMP scheduled for today. If normal, will change losartan to different ARB.  Continue:  Hydralazine 10mg  BID Losartan 100mg  daily Spironolactone 25mg  daily Hydralazine 10mg  prn  Laural Golden, PharmD, BCACP, CDCES, CPP [address] [place], [place], [address] Phone: [phone], Fax: [phone]

## 2022-02-28 NOTE — Progress Notes (Signed)
Radiation Oncology         [phone] ________________________________  Initial Outpatient Consultation  Name: Wendy [name] MRN: [id number]  Date: 03/01/2022  DOB: September 06, 1945  [id number], Wendy Main, MD  Griselda Miner, MD   REFERRING PHYSICIAN: Griselda Miner, MD  DIAGNOSIS:    ICD-10-CM   1. 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 female with a history of significant sun exposure who presented with a lesion located on her right nose tip to [facility] this past December 2023. This was biopsied on 02/07/22 and revealed moderately differentiated squamous cell carcinoma.   She was accordingly referred to Dr. Sarajane Jews on 02/22/22 for consideration of Mohs to the lesion. Exam performed revealed a erythematous, scalp, crusted papule/plaque located on her right superior central nose tip. The patient has had several previous mohs procedures to her nose (see extensive skin cancer history detailed below). For treatment, the patient was offered the option of an additional Mohs procedure followed by plastic surgery, vs consideration of XRT. The patient ultimately opted to pursue radiation therapy which we will discuss in detail today.   Photo from Dr Sarajane Jews     PREVIOUS RADIATION THERAPY: No  PAST MEDICAL HISTORY:  has a past medical history of Anxiety, Atypical chest pain, B12 deficiency, Cervical spondylosis without myelopathy, Diverticulosis of colon, Dysesthesia, GERD (gastroesophageal reflux disease), Hypercholesteremia, Hypertension, IBS (irritable  bowel syndrome), Malignant melanoma ([place]), and Vitreous hemorrhage ([place]).    PAST SURGICAL HISTORY: Past Surgical History:  Procedure Laterality Date   anterior cervical discectomy and fusion  1996   Dr. Sherwood Gambler   arthroscopic shoulder surgery     CESAREAN SECTION     melanoma surgery from ant neck region  1995   skin cancer removed from nose      FAMILY HISTORY: family history includes Cancer in her brother and father; Deep vein thrombosis in her father; Diabetes in her mother; Heart attack in her father; Heart disease in her mother; Hyperlipidemia in her mother; Hypertension in her mother.  SOCIAL HISTORY:  reports that she has never smoked. She has never used smokeless tobacco. She reports that she does not drink alcohol and does not use drugs.  ALLERGIES: Hctz [hydrochlorothiazide], Codeine, Hydrocodone-acetaminophen, Lisinopril, Neomycin, Other, Oxycodone, Pravastatin sodium, and Ciprofloxacin  MEDICATIONS:  Current Outpatient Medications  Medication Sig Dispense Refill   ALPRAZolam (XANAX) 0.25 MG tablet Take 1 tablet by mouth daily as needed.     ascorbic acid (VITAMIN C) 500 MG tablet Take 1 tablet (500 mg total) by mouth daily. 90 tablet 0   aspirin 81 MG tablet Take 81 mg by mouth daily.     b complex vitamins tablet Take 1 tablet by mouth daily.     Cholecalciferol (VITAMIN D) 2000 UNITS CAPS Take 1 capsule by mouth daily.     Coenzyme Q10 (COQ10) 200 MG CAPS Take 1 capsule by mouth daily.     cyclobenzaprine (FLEXERIL) 10 MG tablet Take 10 mg by mouth daily as needed.     DIGEST ENZYMES-ANTICHOLINERGIC PO Take 1 capsule by  mouth 3 (three) times daily. PRN     hydrALAZINE (APRESOLINE) 10 MG tablet Take 1 tablet (10 mg total) by mouth 3 (three) times daily. 90 tablet 0   metoprolol succinate (TOPROL-XL) 25 MG 24 hr tablet Take 1 tablet (25 mg total) by mouth daily. 90 tablet 1   Misc Natural Products (TART CHERRY ADVANCED) CAPS Take 2 capsules by mouth daily.     Multiple  Vitamins-Minerals (MULTIVITAMIN & MINERAL PO) Take 1 tablet by mouth daily.     Omega-3 Fatty Acids (FISH OIL PO) Take 1 capsule by mouth daily.     Red Yeast Rice 600 MG CAPS Take 2 capsules by mouth daily.     rosuvastatin (CRESTOR) 5 MG tablet Take 5 mg by mouth once a week.     spironolactone (ALDACTONE) 25 MG tablet Take 1 tablet (25 mg total) by mouth daily. 90 tablet 3   zinc sulfate 220 (50 Zn) MG capsule Take 1 capsule by mouth daily.     omeprazole (PRILOSEC) 40 MG capsule 1 capsule 30 minutes before morning meal Orally Once a day for cough/hoarseness/acid reflux for 30 day(s) (Patient not taking: Reported on 01/30/2022)     valsartan (DIOVAN) 80 MG tablet Take 1 tablet (80 mg total) by mouth daily. 30 tablet 2   No current facility-administered medications for this encounter.    REVIEW OF SYSTEMS:  Notable for that above.   PHYSICAL EXAM:  height is 5' (1.524 m) and weight is 143 lb 12.8 oz (65.2 kg). Her temperature is 97.3 F (36.3 C) (abnormal). Her blood pressure is 149/77 (abnormal) and her pulse is 58 (abnormal). Her respiration is 18 and oxygen saturation is 100%.   General: Alert and oriented, in no acute distress  HEENT: Head is normocephalic. Extraocular movements are intact.   Neck: Neck is supple, no palpable facial, parotid, cervical or supraclavicular lymphadenopathy. Lymphatics: see Neck Exam Skin: see photo of nose below -cosmetic changes from previous Mohs surgery.  Erythematous lesion at right tip of nose Musculoskeletal: symmetric strength and muscle tone throughout.  Ambulatory Neurologic:   No obvious focalities. Speech is fluent. Coordination is intact. Psychiatric: Judgment and insight are intact. Affect is appropriate.   ECOG = 0  0 - Asymptomatic (Fully active, able to carry on all predisease activities without restriction)  1 - Symptomatic but completely ambulatory (Restricted in physically strenuous activity but ambulatory and able to carry out work  of a light or sedentary nature. For example, light housework, office work)  2 - Symptomatic, <50% in bed during the day (Ambulatory and capable of all self care but unable to carry out any work activities. Up and about more than 50% of waking hours)  3 - Symptomatic, >50% in bed, but not bedbound (Capable of only limited self-care, confined to bed or chair 50% or more of waking hours)  4 - Bedbound (Completely disabled. Cannot carry on any self-care. Totally confined to bed or chair)  5 - Death   Eustace Pen MM, Creech RH, Tormey DC, et al. [phone]). "Toxicity and response criteria of the [facility]". [place] Oncol. 5 (6): 649-55   LABORATORY DATA:  Lab Results  Component Value Date   WBC 9.2 01/31/2022   HGB 13.8 01/31/2022   HCT 42.3 01/31/2022   MCV 90.0 01/31/2022   PLT 248 01/31/2022   CMP     Component Value Date/Time   NA 135 02/28/2022 1439   K 4.9 02/28/2022 1439   CL  96 02/28/2022 1439   CO2 26 02/28/2022 1439   GLUCOSE 92 02/28/2022 1439   GLUCOSE 106 (H) 01/31/2022 1912   BUN 11 02/28/2022 1439   CREATININE 0.83 02/28/2022 1439   CALCIUM 10.3 02/28/2022 1439   PROT 5.5 (L) 05/03/2021 0355   PROT 6.8 03/30/2016 1135   ALBUMIN 3.0 (L) 05/03/2021 0355   ALBUMIN 4.8 03/30/2016 1135   AST 29 05/03/2021 0355   ALT 24 05/03/2021 0355   ALKPHOS 39 05/03/2021 0355   BILITOT 0.5 05/03/2021 0355   BILITOT 0.5 03/30/2016 1135   GFRNONAA >60 01/31/2022 1912   GFRAA 67 01/25/2017 1512         RADIOGRAPHY: LONG TERM MONITOR (3-14 DAYS)  Result Date: 02/21/2022   Predominate rhythm is sinus rhythm.   1 run of nonsustained SVT  - last 8 beats with avg. H Rof 168   Rare PACs, rare PVCs   No significant arrhythmias observed Patch Wear Time:  6 days and 18 hours (2023-12-09T13:54:03-498 to 2023-12-16T08:33:26-0500) Patient had a min HR of 47 bpm, max HR of 174 bpm, and avg HR of 63 bpm. Predominant underlying rhythm was Sinus Rhythm. 1 run of  Supraventricular Tachycardia occurred lasting 8 beats with a max rate of 174 bpm (avg 168 bpm). Isolated SVEs were rare (<1.0%), SVE Couplets were rare (<1.0%), and SVE Triplets were rare (<1.0%). Isolated VEs were rare (<1.0%), and no VE Couplets or VE Triplets were present.   CT Head Wo Contrast  Result Date: 02/01/2022 CLINICAL DATA:  Initial evaluation for syncope/presyncope. CVA suspected. EXAM: CT HEAD WITHOUT CONTRAST TECHNIQUE: Contiguous axial images were obtained from the base of the skull through the vertex without intravenous contrast. RADIATION DOSE REDUCTION: This exam was performed according to the departmental dose-optimization program which includes automated exposure control, adjustment of the mA and/or kV according to patient size and/or use of iterative reconstruction technique. COMPARISON:  Prior study from 11/17/2021. FINDINGS: Brain: Cerebral volume within normal limits for age. Patchy hypodensity involving the supratentorial cerebral white matter, consistent with chronic small vessel ischemic disease. No acute intracranial hemorrhage. No acute large vessel territory infarct. No mass lesion or midline shift. No hydrocephalus or extra-axial fluid collection. Vascular: No abnormal hyperdense vessel. Scattered vascular calcifications noted within the carotid siphons. Skull: Scalp soft tissues and calvarium demonstrate no acute finding. Sinuses/Orbits: Globes orbital soft tissues within normal limits. Small volume layering secretions noted within the right sphenoid sinus. Paranasal sinuses are otherwise largely clear. No mastoid effusion. Other: None. IMPRESSION: 1. No acute intracranial abnormality. 2. Mild to moderate chronic microvascular ischemic disease. Electronically Signed   By: Jeannine Boga M.D.   On: 02/01/2022 00:18   DG Chest Port 1 View  Result Date: 01/31/2022 CLINICAL DATA:  Shortness of breath. EXAM: PORTABLE CHEST 1 VIEW COMPARISON:  Radiograph 04/28/2021, cardiac  CT 12/12/2021 FINDINGS: The cardiomediastinal contours are normal. Mitral annulus calcifications. The lungs are clear. Pulmonary vasculature is normal. No consolidation, pleural effusion, or pneumothorax. No acute osseous abnormalities are seen. IMPRESSION: No acute chest findings. Electronically Signed   By: Keith Rake M.D.   On: 01/31/2022 20:10      IMPRESSION/PLAN: Squamous cell carcinoma of right tip of nose  Today, I talked to the patient about the findings and work-up thus far.  We discussed the patient's diagnosis of skin cancer of the nose and general treatment for this, highlighting the role of radiotherapy in the management.  We discussed the available radiation techniques, and focused on the details  of logistics and delivery.   She is not interested in further surgery and she is enthusiastic about radiation  We discussed the risks, benefits, and side effects of radiotherapy. Side effects may include but not necessarily be limited to: Skin irritation, fatigue, mucosal irritation of the nostril, bleeding of the skin or nostrils, hair loss in the nose and nostrils, moist desquamation of the skin, rare permanent injury to the nose including risk of cartilage necrosis, permanent skin changes.  No guarantees of treatment were given. A consent form was signed and placed in the patient's medical record.  The patient was encouraged to ask questions that I answered to the best of my ability.   I recommend a 4-week course of standard hypofractionated electron radiotherapy to the right tip of the nose with margin.  We will get her scheduled in the near future for CT simulation.  It was a pleasure to meet this wonderful patient today and I look forward to participating in her care.   On date of service, in total, I spent 45 minutes on this encounter. Patient was seen in person.   __________________________________________   Eppie Gibson, MD  This document serves as a record of services  personally performed by Eppie Gibson, MD. It was created on her behalf by Roney Mans, a trained medical scribe. The creation of this record is based on the scribe's personal observations and the provider's statements to them. This document has been checked and approved by the attending provider.

## 2022-02-28 NOTE — Patient Instructions (Signed)
It was good seeing you again  We would like your blood pressure to be less than 130/80  Please continue your:  Spironolactone '25mg'$  daily Losartan '100mg'$  daily Metoprolol '25mg'$  daily  We will check your lab work today and if everything looks good we will change your losartan to a different medication  Karren Cobble, PharmD, [place], [place], [place], [place], [place], [address] Phone: [phone], Fax: [phone]

## 2022-02-28 NOTE — Progress Notes (Signed)
Oncology Nurse Navigator Documentation   Placed introductory call to new referral patient Nakeya Adinolfi.  Introduced myself as the H&N oncology nurse navigator that works with Dr. Isidore Moos to whom she has been referred by Dr. Sarajane Jews. She confirmed understanding of referral. Briefly explained my role as her navigator, provided my contact information.  Confirmed understanding of upcoming appts and [place] location, explained arrival and registration process. I encouraged her to call with questions/concerns as she moves forward with appts and procedures.   She verbalized understanding of information provided, expressed appreciation for my call.   Navigator Initial Assessment Employment Status: she is retired Currently on [employer] / STD: no Living Situation: she lives with her husband Support System: husband, family PCP: Reynold Bowen MD PCD: na Financial Concerns: no Transportation Needs: no Sensory Deficits: no Engineer, building services Needed:  no Ambulation Needs: no Psychosocial Needs:  no Concerns/Needs Understanding Cancer:  addressed/answered by navigator to best of ability Self-Expressed Needs: no   Clinical biochemist, BSN, OCN Head & Neck Oncology Nurse [place] at [facility] Phone # [phone]  Fax # [phone]

## 2022-03-01 ENCOUNTER — Telehealth: Payer: Self-pay | Admitting: Pharmacist

## 2022-03-01 ENCOUNTER — Ambulatory Visit
Admission: RE | Admit: 2022-03-01 | Discharge: 2022-03-01 | Disposition: A | Discharge status: Home / Self Care | Payer: Medicare Other | Source: Ambulatory Visit | Attending: Radiation Oncology | Admitting: Radiation Oncology

## 2022-03-01 ENCOUNTER — Other Ambulatory Visit: Payer: Self-pay

## 2022-03-01 ENCOUNTER — Encounter: Payer: Self-pay | Admitting: Radiation Oncology

## 2022-03-01 VITALS — BP 149/77 | HR 58 | Temp 97.3°F | Resp 18 | Ht 60.0 in | Wt 143.8 lb

## 2022-03-01 DIAGNOSIS — Z79899 Other long term (current) drug therapy: Secondary | ICD-10-CM | POA: Insufficient documentation

## 2022-03-01 DIAGNOSIS — E7800 Pure hypercholesterolemia, unspecified: Secondary | ICD-10-CM | POA: Insufficient documentation

## 2022-03-01 DIAGNOSIS — I10 Essential (primary) hypertension: Secondary | ICD-10-CM | POA: Insufficient documentation

## 2022-03-01 DIAGNOSIS — E538 Deficiency of other specified B group vitamins: Secondary | ICD-10-CM | POA: Diagnosis not present

## 2022-03-01 DIAGNOSIS — C44321 Squamous cell carcinoma of skin of nose: Secondary | ICD-10-CM | POA: Diagnosis not present

## 2022-03-01 DIAGNOSIS — K219 Gastro-esophageal reflux disease without esophagitis: Secondary | ICD-10-CM | POA: Diagnosis not present

## 2022-03-01 DIAGNOSIS — Z85820 Personal history of malignant melanoma of skin: Secondary | ICD-10-CM | POA: Diagnosis not present

## 2022-03-01 LAB — BASIC METABOLIC PANEL
BUN/Creatinine Ratio: 13 (ref 12–28)
BUN: 11 mg/dL (ref 8–27)
CO2: 26 mmol/L (ref 20–29)
Calcium: 10.3 mg/dL (ref 8.7–10.3)
Chloride: 96 mmol/L (ref 96–106)
Creatinine, Ser: 0.83 mg/dL (ref 0.57–1.00)
Glucose: 92 mg/dL (ref 70–99)
Potassium: 4.9 mmol/L (ref 3.5–5.2)
Sodium: 135 mmol/L (ref 134–144)
eGFR: 73 mL/min/{1.73_m2} (ref >59)

## 2022-03-01 MED ORDER — VALSARTAN 80 MG PO TABS: 80.0000 mg | ORAL_TABLET | Freq: Every day | ORAL | 2 refills | Status: DC

## 2022-03-01 NOTE — Telephone Encounter (Signed)
Renal function and electrolytes normal. Contacted patient and lmom.  Will switch losartan to valsartan

## 2022-03-01 NOTE — Progress Notes (Signed)
Oncology Nurse Navigator Documentation   Met with patient during initial consult with Dr. Isidore Moos.  Further introduced myself as her Navigator, explained my role as a member of the Care Team. She knows that she will be contacted by CT simulation for an appointment for her radiation planning.  She verbalized understanding of information provided. I encouraged her to call with questions/concerns moving forward.  Harlow Asa, RN, BSN, OCN Head & Neck Oncology Nurse [place] at [facility] [phone]

## 2022-03-02 DIAGNOSIS — N952 Postmenopausal atrophic vaginitis: Secondary | ICD-10-CM | POA: Diagnosis not present

## 2022-03-02 DIAGNOSIS — K642 Third degree hemorrhoids: Secondary | ICD-10-CM | POA: Diagnosis not present

## 2022-03-06 NOTE — Telephone Encounter (Signed)
This encounter was created in error - please disregard.

## 2022-03-08 ENCOUNTER — Ambulatory Visit
Admission: RE | Admit: 2022-03-08 | Discharge: 2022-03-08 | Disposition: A | Discharge status: Home / Self Care | Payer: Medicare Other | Source: Ambulatory Visit | Attending: Radiation Oncology | Admitting: Radiation Oncology

## 2022-03-08 DIAGNOSIS — C44321 Squamous cell carcinoma of skin of nose: Secondary | ICD-10-CM | POA: Insufficient documentation

## 2022-03-08 DIAGNOSIS — Z510 Encounter for antineoplastic radiation therapy: Secondary | ICD-10-CM | POA: Diagnosis not present

## 2022-03-08 NOTE — Progress Notes (Signed)
Oncology Nurse Navigator Documentation   Ms. Carolan presented for her CT simulation today. She tolerated procedure without difficulty, denied questions/concerns. She knows to call me if she has any questions or concerns before and during treatment.   Harlow Asa RN, BSN, OCN Head & Neck Oncology Nurse [place] at [facility] Phone # [phone]  Fax # [phone]

## 2022-03-10 DIAGNOSIS — C44321 Squamous cell carcinoma of skin of nose: Secondary | ICD-10-CM | POA: Diagnosis not present

## 2022-03-10 DIAGNOSIS — Z85820 Personal history of malignant melanoma of skin: Secondary | ICD-10-CM | POA: Diagnosis not present

## 2022-03-10 DIAGNOSIS — D225 Melanocytic nevi of trunk: Secondary | ICD-10-CM | POA: Diagnosis not present

## 2022-03-10 DIAGNOSIS — Z808 Family history of malignant neoplasm of other organs or systems: Secondary | ICD-10-CM | POA: Diagnosis not present

## 2022-03-10 DIAGNOSIS — Z510 Encounter for antineoplastic radiation therapy: Secondary | ICD-10-CM | POA: Diagnosis not present

## 2022-03-10 DIAGNOSIS — Z802 Family history of malignant neoplasm of other respiratory and intrathoracic organs: Secondary | ICD-10-CM | POA: Diagnosis not present

## 2022-03-10 DIAGNOSIS — L430 Hypertrophic lichen planus: Secondary | ICD-10-CM | POA: Diagnosis not present

## 2022-03-10 DIAGNOSIS — Z85828 Personal history of other malignant neoplasm of skin: Secondary | ICD-10-CM | POA: Diagnosis not present

## 2022-03-10 DIAGNOSIS — L821 Other seborrheic keratosis: Secondary | ICD-10-CM | POA: Diagnosis not present

## 2022-03-10 DIAGNOSIS — Z1379 Encounter for other screening for genetic and chromosomal anomalies: Secondary | ICD-10-CM | POA: Diagnosis not present

## 2022-03-10 DIAGNOSIS — L814 Other melanin hyperpigmentation: Secondary | ICD-10-CM | POA: Diagnosis not present

## 2022-03-10 DIAGNOSIS — L570 Actinic keratosis: Secondary | ICD-10-CM | POA: Diagnosis not present

## 2022-03-10 DIAGNOSIS — D485 Neoplasm of uncertain behavior of skin: Secondary | ICD-10-CM | POA: Diagnosis not present

## 2022-03-16 ENCOUNTER — Other Ambulatory Visit (HOSPITAL_COMMUNITY): Payer: Self-pay

## 2022-03-16 ENCOUNTER — Telehealth: Payer: Self-pay | Admitting: Pharmacist

## 2022-03-16 DIAGNOSIS — Z85828 Personal history of other malignant neoplasm of skin: Secondary | ICD-10-CM | POA: Diagnosis not present

## 2022-03-16 DIAGNOSIS — Z85820 Personal history of malignant melanoma of skin: Secondary | ICD-10-CM | POA: Diagnosis not present

## 2022-03-16 DIAGNOSIS — D485 Neoplasm of uncertain behavior of skin: Secondary | ICD-10-CM | POA: Diagnosis not present

## 2022-03-16 DIAGNOSIS — L570 Actinic keratosis: Secondary | ICD-10-CM | POA: Diagnosis not present

## 2022-03-16 DIAGNOSIS — L739 Follicular disorder, unspecified: Secondary | ICD-10-CM | POA: Diagnosis not present

## 2022-03-16 DIAGNOSIS — I10 Essential (primary) hypertension: Secondary | ICD-10-CM

## 2022-03-16 NOTE — Telephone Encounter (Signed)
Patient called to report BP readings. Has begun taking valsartan twice daily.  Blood pressure dropping and patient is pleased. Has to cancel appts with PharmD and Coletta Memos this month due to radiation appointments.  137/81 138/78 158/85 146/83 157/86 134/84 133/84

## 2022-03-17 ENCOUNTER — Telehealth: Payer: Self-pay

## 2022-03-17 DIAGNOSIS — Z510 Encounter for antineoplastic radiation therapy: Secondary | ICD-10-CM | POA: Diagnosis not present

## 2022-03-17 DIAGNOSIS — C44321 Squamous cell carcinoma of skin of nose: Secondary | ICD-10-CM | POA: Diagnosis not present

## 2022-03-17 NOTE — Telephone Encounter (Signed)
Pharmacy Patient Advocate Encounter  Prior Authorization for PRALUENT '75MG'$ /ML has been approved.    PA# [id number] Effective dates: 03/16/22 through 03/16/23   Received notification from [facility] that prior authorization for PRALUENT '75MG'$ /ML is needed.    PA submitted on 1/18/124 Key [id number] Status is pending  Karie Soda, [place] Patient Advocate Specialist Direct Number: [phone] Fax: [phone]

## 2022-03-20 ENCOUNTER — Telehealth: Payer: Self-pay | Admitting: Cardiovascular Disease

## 2022-03-20 ENCOUNTER — Encounter: Payer: Self-pay | Admitting: General Practice

## 2022-03-20 ENCOUNTER — Ambulatory Visit
Admission: RE | Admit: 2022-03-20 | Discharge: 2022-03-20 | Disposition: A | Discharge status: Home / Self Care | Payer: Medicare Other | Source: Ambulatory Visit | Attending: Radiation Oncology | Admitting: Radiation Oncology

## 2022-03-20 ENCOUNTER — Other Ambulatory Visit: Payer: Self-pay

## 2022-03-20 DIAGNOSIS — C44321 Squamous cell carcinoma of skin of nose: Secondary | ICD-10-CM

## 2022-03-20 DIAGNOSIS — I10 Essential (primary) hypertension: Secondary | ICD-10-CM

## 2022-03-20 DIAGNOSIS — Z510 Encounter for antineoplastic radiation therapy: Secondary | ICD-10-CM | POA: Diagnosis not present

## 2022-03-20 LAB — RAD ONC ARIA SESSION SUMMARY
Course Elapsed Days: 0
Plan Fractions Treated to Date: 1
Plan Prescribed Dose Per Fraction: 2.5 Gy
Plan Total Fractions Prescribed: 20
Plan Total Prescribed Dose: 50 Gy
Reference Point Dosage Given to Date: 2.5 Gy
Reference Point Session Dosage Given: 2.5 Gy
Session Number: 1

## 2022-03-20 MED ORDER — SONAFINE EX EMUL
1.0000 | Freq: Once | CUTANEOUS | Status: AC
  Administered 2022-03-20: 1 via TOPICAL

## 2022-03-20 NOTE — Telephone Encounter (Signed)
*  STAT* If patient is at the pharmacy, call can be transferred to refill team.   1. Which medications need to be refilled? (please list name of each medication and dose if known)   valsartan (DIOVAN) 80 MG tablet   2. Which pharmacy/location (including street and city if local pharmacy) is medication to be sent to? [place], [place]   3. Do they need a 30 day or 90 day supply?  90 day  Pt has only 1 tablet

## 2022-03-20 NOTE — Progress Notes (Signed)
Oncology Nurse Navigator Documentation   To provide support, encouragement and care continuity, met with Ms. Wendy [name] for her initial RT.    Ms. Wendy [name] completed treatment without difficulty, denied questions/concerns. Dr. Isidore Moos has contacted Dr. Elvera Lennox regarding her very recent nose biopsy for another area on her nose.  I reviewed the registration/arrival procedure for subsequent treatments. I encouraged her to call me with questions/concerns as treatments proceed.   Harlow Asa RN, BSN, OCN Head & Neck Oncology Nurse [place] at [facility] Phone # [phone]  Fax # [phone]

## 2022-03-20 NOTE — Progress Notes (Signed)
[place] Note  Met Wendy [name] by phone per referral from nursing for additional layer of emotional support. She is using perspective, gratitude, and faith to cope, and she reports good support from her husband, family, and small prayer group.  Wendy [name] is beginning to process this round of skin-cancer treatment, and she would value a conversation partner from a spiritual perspective, so we plan to meet after her treatment on Tuesday 1/30. She has direct Spiritual Care number in case needs arise in the meantime.   [place], [place], [facility] Pager [phone] Voicemail [phone]

## 2022-03-21 ENCOUNTER — Ambulatory Visit: Payer: Medicare Other

## 2022-03-21 ENCOUNTER — Other Ambulatory Visit: Payer: Self-pay | Admitting: *Deleted

## 2022-03-21 ENCOUNTER — Other Ambulatory Visit: Payer: Self-pay

## 2022-03-21 ENCOUNTER — Ambulatory Visit
Admission: RE | Admit: 2022-03-21 | Discharge: 2022-03-21 | Disposition: A | Discharge status: Home / Self Care | Payer: Medicare Other | Source: Ambulatory Visit | Attending: Radiation Oncology | Admitting: Radiation Oncology

## 2022-03-21 DIAGNOSIS — C44321 Squamous cell carcinoma of skin of nose: Secondary | ICD-10-CM | POA: Diagnosis not present

## 2022-03-21 DIAGNOSIS — I10 Essential (primary) hypertension: Secondary | ICD-10-CM | POA: Diagnosis not present

## 2022-03-21 DIAGNOSIS — Z510 Encounter for antineoplastic radiation therapy: Secondary | ICD-10-CM | POA: Diagnosis not present

## 2022-03-21 LAB — RAD ONC ARIA SESSION SUMMARY
Course Elapsed Days: 1
Plan Fractions Treated to Date: 2
Plan Prescribed Dose Per Fraction: 2.5 Gy
Plan Total Fractions Prescribed: 20
Plan Total Prescribed Dose: 50 Gy
Reference Point Dosage Given to Date: 5 Gy
Reference Point Session Dosage Given: 2.5 Gy
Session Number: 2

## 2022-03-21 MED ORDER — VALSARTAN 80 MG PO TABS: 80.0000 mg | ORAL_TABLET | Freq: Two times a day (BID) | ORAL | 3 refills | Status: DC

## 2022-03-21 NOTE — Telephone Encounter (Signed)
Spoke with patient and make her aware that I sent her refill in. Patient thanked me for the call

## 2022-03-22 ENCOUNTER — Ambulatory Visit
Admission: RE | Admit: 2022-03-22 | Discharge: 2022-03-22 | Disposition: A | Discharge status: Home / Self Care | Payer: Medicare Other | Source: Ambulatory Visit | Attending: Radiation Oncology | Admitting: Radiation Oncology

## 2022-03-22 ENCOUNTER — Other Ambulatory Visit: Payer: Self-pay

## 2022-03-22 DIAGNOSIS — C44321 Squamous cell carcinoma of skin of nose: Secondary | ICD-10-CM | POA: Diagnosis not present

## 2022-03-22 DIAGNOSIS — Z510 Encounter for antineoplastic radiation therapy: Secondary | ICD-10-CM | POA: Diagnosis not present

## 2022-03-22 LAB — RAD ONC ARIA SESSION SUMMARY
Course Elapsed Days: 2
Plan Fractions Treated to Date: 3
Plan Prescribed Dose Per Fraction: 2.5 Gy
Plan Total Fractions Prescribed: 20
Plan Total Prescribed Dose: 50 Gy
Reference Point Dosage Given to Date: 7.5 Gy
Reference Point Session Dosage Given: 2.5 Gy
Session Number: 3

## 2022-03-22 LAB — BASIC METABOLIC PANEL
BUN/Creatinine Ratio: 22 (ref 12–28)
BUN: 17 mg/dL (ref 8–27)
CO2: 21 mmol/L (ref 20–29)
Calcium: 10.1 mg/dL (ref 8.7–10.3)
Chloride: 93 mmol/L — ABNORMAL LOW (ref 96–106)
Creatinine, Ser: 0.79 mg/dL (ref 0.57–1.00)
Glucose: 90 mg/dL (ref 70–99)
Potassium: 5.3 mmol/L — ABNORMAL HIGH (ref 3.5–5.2)
Sodium: 132 mmol/L — ABNORMAL LOW (ref 134–144)
eGFR: 77 mL/min/{1.73_m2} (ref >59)

## 2022-03-23 ENCOUNTER — Ambulatory Visit
Admission: RE | Admit: 2022-03-23 | Discharge: 2022-03-23 | Disposition: A | Discharge status: Home / Self Care | Payer: Medicare Other | Source: Ambulatory Visit | Attending: Radiation Oncology | Admitting: Radiation Oncology

## 2022-03-23 ENCOUNTER — Other Ambulatory Visit: Payer: Self-pay

## 2022-03-23 DIAGNOSIS — C44321 Squamous cell carcinoma of skin of nose: Secondary | ICD-10-CM | POA: Diagnosis not present

## 2022-03-23 DIAGNOSIS — Z510 Encounter for antineoplastic radiation therapy: Secondary | ICD-10-CM | POA: Diagnosis not present

## 2022-03-23 LAB — RAD ONC ARIA SESSION SUMMARY
Course Elapsed Days: 3
Plan Fractions Treated to Date: 4
Plan Prescribed Dose Per Fraction: 2.5 Gy
Plan Total Fractions Prescribed: 20
Plan Total Prescribed Dose: 50 Gy
Reference Point Dosage Given to Date: 10 Gy
Reference Point Session Dosage Given: 2.5 Gy
Session Number: 4

## 2022-03-24 ENCOUNTER — Other Ambulatory Visit: Payer: Self-pay

## 2022-03-24 ENCOUNTER — Ambulatory Visit
Admission: RE | Admit: 2022-03-24 | Discharge: 2022-03-24 | Disposition: A | Discharge status: Home / Self Care | Payer: Medicare Other | Source: Ambulatory Visit | Attending: Radiation Oncology | Admitting: Radiation Oncology

## 2022-03-24 DIAGNOSIS — C44321 Squamous cell carcinoma of skin of nose: Secondary | ICD-10-CM | POA: Diagnosis not present

## 2022-03-24 DIAGNOSIS — Z510 Encounter for antineoplastic radiation therapy: Secondary | ICD-10-CM | POA: Diagnosis not present

## 2022-03-24 LAB — RAD ONC ARIA SESSION SUMMARY
Course Elapsed Days: 4
Plan Fractions Treated to Date: 5
Plan Prescribed Dose Per Fraction: 2.5 Gy
Plan Total Fractions Prescribed: 20
Plan Total Prescribed Dose: 50 Gy
Reference Point Dosage Given to Date: 12.5 Gy
Reference Point Session Dosage Given: 2.5 Gy
Session Number: 5

## 2022-03-27 ENCOUNTER — Ambulatory Visit
Admission: RE | Admit: 2022-03-27 | Discharge: 2022-03-27 | Disposition: A | Discharge status: Home / Self Care | Payer: Medicare Other | Source: Ambulatory Visit | Attending: Radiation Oncology | Admitting: Radiation Oncology

## 2022-03-27 ENCOUNTER — Other Ambulatory Visit: Payer: Self-pay

## 2022-03-27 DIAGNOSIS — Z510 Encounter for antineoplastic radiation therapy: Secondary | ICD-10-CM | POA: Diagnosis not present

## 2022-03-27 DIAGNOSIS — C44321 Squamous cell carcinoma of skin of nose: Secondary | ICD-10-CM | POA: Diagnosis not present

## 2022-03-27 LAB — RAD ONC ARIA SESSION SUMMARY
Course Elapsed Days: 7
Plan Fractions Treated to Date: 6
Plan Prescribed Dose Per Fraction: 2.5 Gy
Plan Total Fractions Prescribed: 20
Plan Total Prescribed Dose: 50 Gy
Reference Point Dosage Given to Date: 15 Gy
Reference Point Session Dosage Given: 2.5 Gy
Session Number: 6

## 2022-03-27 MED ORDER — SONAFINE EX EMUL
1.0000 | Freq: Once | CUTANEOUS | Status: AC
  Administered 2022-03-27: 1 via TOPICAL

## 2022-03-28 ENCOUNTER — Telehealth: Payer: Self-pay | Admitting: Pharmacist

## 2022-03-28 ENCOUNTER — Ambulatory Visit: Payer: Medicare Other | Admitting: General Practice

## 2022-03-28 ENCOUNTER — Ambulatory Visit
Admission: RE | Admit: 2022-03-28 | Discharge: 2022-03-28 | Disposition: A | Discharge status: Home / Self Care | Payer: Medicare Other | Source: Ambulatory Visit | Attending: Radiation Oncology | Admitting: Radiation Oncology

## 2022-03-28 ENCOUNTER — Other Ambulatory Visit: Payer: Self-pay

## 2022-03-28 ENCOUNTER — Inpatient Hospital Stay: Payer: Medicare Other | Attending: Radiation Oncology | Admitting: General Practice

## 2022-03-28 DIAGNOSIS — C44321 Squamous cell carcinoma of skin of nose: Secondary | ICD-10-CM | POA: Diagnosis not present

## 2022-03-28 DIAGNOSIS — Z510 Encounter for antineoplastic radiation therapy: Secondary | ICD-10-CM | POA: Diagnosis not present

## 2022-03-28 LAB — RAD ONC ARIA SESSION SUMMARY
Course Elapsed Days: 8
Plan Fractions Treated to Date: 7
Plan Prescribed Dose Per Fraction: 2.5 Gy
Plan Total Fractions Prescribed: 20
Plan Total Prescribed Dose: 50 Gy
Reference Point Dosage Given to Date: 17.5 Gy
Reference Point Session Dosage Given: 2.5 Gy
Session Number: 7

## 2022-03-28 NOTE — Telephone Encounter (Signed)
Patient called, has tested positive for Covid.   Recent BP readings: 131/83, 137/79, 129/80, 129/71.    Lab work showed decreased Na which is not uncommon for her. However BP today was 158 while sick. Advised to continue current HTN regimen until she feels better and then can reduce spironolactone back to 12.'5mg'$  daily.

## 2022-03-28 NOTE — Progress Notes (Signed)
[place] Spiritual Care Note  Met with Ms Odriscoll in [place] office as planned.   Provided pastoral presence, empathic listening, emotional support, and normalization of feelings as she shared about the tension between struggling emotionally with being in cancer treatment and also recognizing that many people have more dire diagnoses.  We talked about the healing importance of allowing oneself to feel all the feelings without judging them. We also named that some of what she is feeling may be grief about losing her "old normal" before this new diagnosis. Finally, we talked about the healing potential of ritual, including the possibility of combining her love of walking and of prayer by utilizing the labyrinth in the healing garden.  Ms Kuhlmann notes that she found the session very helpful (feeling heard and cared for, exploring new tools for coping) and plans to reach out to chaplain as needed for further support.   [place], [place], [facility] Pager [phone] Voicemail [phone]

## 2022-03-29 ENCOUNTER — Ambulatory Visit
Admission: RE | Admit: 2022-03-29 | Discharge: 2022-03-29 | Disposition: A | Discharge status: Home / Self Care | Payer: Medicare Other | Source: Ambulatory Visit | Attending: Radiation Oncology | Admitting: Radiation Oncology

## 2022-03-29 ENCOUNTER — Other Ambulatory Visit: Payer: Self-pay

## 2022-03-29 DIAGNOSIS — Z510 Encounter for antineoplastic radiation therapy: Secondary | ICD-10-CM | POA: Diagnosis not present

## 2022-03-29 DIAGNOSIS — C44321 Squamous cell carcinoma of skin of nose: Secondary | ICD-10-CM | POA: Diagnosis not present

## 2022-03-29 LAB — RAD ONC ARIA SESSION SUMMARY
Course Elapsed Days: 9
Plan Fractions Treated to Date: 8
Plan Prescribed Dose Per Fraction: 2.5 Gy
Plan Total Fractions Prescribed: 20
Plan Total Prescribed Dose: 50 Gy
Reference Point Dosage Given to Date: 20 Gy
Reference Point Session Dosage Given: 2.5 Gy
Session Number: 8

## 2022-03-30 ENCOUNTER — Other Ambulatory Visit: Payer: Self-pay

## 2022-03-30 ENCOUNTER — Ambulatory Visit
Admission: RE | Admit: 2022-03-30 | Discharge: 2022-03-30 | Disposition: A | Discharge status: Home / Self Care | Payer: Medicare Other | Source: Ambulatory Visit | Attending: Radiation Oncology | Admitting: Radiation Oncology

## 2022-03-30 DIAGNOSIS — C44321 Squamous cell carcinoma of skin of nose: Secondary | ICD-10-CM | POA: Insufficient documentation

## 2022-03-30 LAB — RAD ONC ARIA SESSION SUMMARY
Course Elapsed Days: 10
Plan Fractions Treated to Date: 9
Plan Prescribed Dose Per Fraction: 2.5 Gy
Plan Total Fractions Prescribed: 20
Plan Total Prescribed Dose: 50 Gy
Reference Point Dosage Given to Date: 22.5 Gy
Reference Point Session Dosage Given: 2.5 Gy
Session Number: 9

## 2022-03-31 ENCOUNTER — Other Ambulatory Visit: Payer: Self-pay

## 2022-03-31 ENCOUNTER — Ambulatory Visit
Admission: RE | Admit: 2022-03-31 | Discharge: 2022-03-31 | Disposition: A | Discharge status: Home / Self Care | Payer: Medicare Other | Source: Ambulatory Visit | Attending: Radiation Oncology | Admitting: Radiation Oncology

## 2022-03-31 DIAGNOSIS — Z510 Encounter for antineoplastic radiation therapy: Secondary | ICD-10-CM | POA: Diagnosis not present

## 2022-03-31 DIAGNOSIS — C44321 Squamous cell carcinoma of skin of nose: Secondary | ICD-10-CM | POA: Diagnosis not present

## 2022-03-31 LAB — RAD ONC ARIA SESSION SUMMARY
Course Elapsed Days: 11
Plan Fractions Treated to Date: 10
Plan Prescribed Dose Per Fraction: 2.5 Gy
Plan Total Fractions Prescribed: 20
Plan Total Prescribed Dose: 50 Gy
Reference Point Dosage Given to Date: 25 Gy
Reference Point Session Dosage Given: 2.5 Gy
Session Number: 10

## 2022-04-03 ENCOUNTER — Ambulatory Visit
Admission: RE | Admit: 2022-04-03 | Discharge: 2022-04-03 | Disposition: A | Discharge status: Home / Self Care | Payer: Medicare Other | Source: Ambulatory Visit | Attending: Radiation Oncology | Admitting: Radiation Oncology

## 2022-04-03 ENCOUNTER — Other Ambulatory Visit: Payer: Self-pay

## 2022-04-03 DIAGNOSIS — C44321 Squamous cell carcinoma of skin of nose: Secondary | ICD-10-CM | POA: Diagnosis not present

## 2022-04-03 LAB — RAD ONC ARIA SESSION SUMMARY
Course Elapsed Days: 14
Plan Fractions Treated to Date: 11
Plan Prescribed Dose Per Fraction: 2.5 Gy
Plan Total Fractions Prescribed: 20
Plan Total Prescribed Dose: 50 Gy
Reference Point Dosage Given to Date: 27.5 Gy
Reference Point Session Dosage Given: 2.5 Gy
Session Number: 11

## 2022-04-04 ENCOUNTER — Other Ambulatory Visit: Payer: Self-pay

## 2022-04-04 ENCOUNTER — Ambulatory Visit
Admission: RE | Admit: 2022-04-04 | Discharge: 2022-04-04 | Disposition: A | Discharge status: Home / Self Care | Payer: Medicare Other | Source: Ambulatory Visit | Attending: Radiation Oncology | Admitting: Radiation Oncology

## 2022-04-04 DIAGNOSIS — C44321 Squamous cell carcinoma of skin of nose: Secondary | ICD-10-CM | POA: Diagnosis not present

## 2022-04-04 LAB — RAD ONC ARIA SESSION SUMMARY
Course Elapsed Days: 15
Plan Fractions Treated to Date: 12
Plan Prescribed Dose Per Fraction: 2.5 Gy
Plan Total Fractions Prescribed: 20
Plan Total Prescribed Dose: 50 Gy
Reference Point Dosage Given to Date: 30 Gy
Reference Point Session Dosage Given: 2.5 Gy
Session Number: 12

## 2022-04-05 ENCOUNTER — Ambulatory Visit
Admission: RE | Admit: 2022-04-05 | Discharge: 2022-04-05 | Disposition: A | Discharge status: Home / Self Care | Payer: Medicare Other | Source: Ambulatory Visit | Attending: Radiation Oncology | Admitting: Radiation Oncology

## 2022-04-05 ENCOUNTER — Other Ambulatory Visit: Payer: Self-pay

## 2022-04-05 DIAGNOSIS — C44321 Squamous cell carcinoma of skin of nose: Secondary | ICD-10-CM | POA: Diagnosis not present

## 2022-04-05 LAB — RAD ONC ARIA SESSION SUMMARY
Course Elapsed Days: 16
Plan Fractions Treated to Date: 13
Plan Prescribed Dose Per Fraction: 2.5 Gy
Plan Total Fractions Prescribed: 20
Plan Total Prescribed Dose: 50 Gy
Reference Point Dosage Given to Date: 32.5 Gy
Reference Point Session Dosage Given: 2.5 Gy
Session Number: 13

## 2022-04-06 ENCOUNTER — Ambulatory Visit
Admission: RE | Admit: 2022-04-06 | Discharge: 2022-04-06 | Disposition: A | Discharge status: Home / Self Care | Payer: Medicare Other | Source: Ambulatory Visit | Attending: Radiation Oncology | Admitting: Radiation Oncology

## 2022-04-06 ENCOUNTER — Other Ambulatory Visit: Payer: Self-pay

## 2022-04-06 DIAGNOSIS — C44321 Squamous cell carcinoma of skin of nose: Secondary | ICD-10-CM | POA: Diagnosis not present

## 2022-04-06 LAB — RAD ONC ARIA SESSION SUMMARY
Course Elapsed Days: 17
Plan Fractions Treated to Date: 14
Plan Prescribed Dose Per Fraction: 2.5 Gy
Plan Total Fractions Prescribed: 20
Plan Total Prescribed Dose: 50 Gy
Reference Point Dosage Given to Date: 35 Gy
Reference Point Session Dosage Given: 2.5 Gy
Session Number: 14

## 2022-04-07 ENCOUNTER — Telehealth: Payer: Self-pay | Admitting: Pharmacist

## 2022-04-07 ENCOUNTER — Ambulatory Visit
Admission: RE | Admit: 2022-04-07 | Discharge: 2022-04-07 | Disposition: A | Discharge status: Home / Self Care | Payer: Medicare Other | Source: Ambulatory Visit | Attending: Radiation Oncology | Admitting: Radiation Oncology

## 2022-04-07 ENCOUNTER — Other Ambulatory Visit: Payer: Self-pay

## 2022-04-07 DIAGNOSIS — Z510 Encounter for antineoplastic radiation therapy: Secondary | ICD-10-CM | POA: Diagnosis not present

## 2022-04-07 DIAGNOSIS — C44321 Squamous cell carcinoma of skin of nose: Secondary | ICD-10-CM | POA: Diagnosis not present

## 2022-04-07 DIAGNOSIS — E7800 Pure hypercholesterolemia, unspecified: Secondary | ICD-10-CM

## 2022-04-07 DIAGNOSIS — I10 Essential (primary) hypertension: Secondary | ICD-10-CM

## 2022-04-07 DIAGNOSIS — I6523 Occlusion and stenosis of bilateral carotid arteries: Secondary | ICD-10-CM

## 2022-04-07 DIAGNOSIS — I700 Atherosclerosis of aorta: Secondary | ICD-10-CM

## 2022-04-07 LAB — RAD ONC ARIA SESSION SUMMARY
Course Elapsed Days: 18
Plan Fractions Treated to Date: 15
Plan Prescribed Dose Per Fraction: 2.5 Gy
Plan Total Fractions Prescribed: 20
Plan Total Prescribed Dose: 50 Gy
Reference Point Dosage Given to Date: 37.5 Gy
Reference Point Session Dosage Given: 2.5 Gy
Session Number: 15

## 2022-04-07 MED ORDER — PRALUENT 75 MG/ML ~~LOC~~ SOAJ: 1.0000 mL | SUBCUTANEOUS | 3 refills | Status: DC

## 2022-04-07 NOTE — Telephone Encounter (Signed)
Called and spoke with patient. Feeling better, no longer has Covid.   Recent BP readings: 139/74, 57 127/81, 60 143/72, 50  Taking spironolactone 12.90m daily  Taking valsartan 879mBID and metoprolol in the evening.  Patient willing to start Praluent at this time.

## 2022-04-10 ENCOUNTER — Other Ambulatory Visit: Payer: Self-pay

## 2022-04-10 ENCOUNTER — Ambulatory Visit
Admission: RE | Admit: 2022-04-10 | Discharge: 2022-04-10 | Disposition: A | Discharge status: Home / Self Care | Payer: Medicare Other | Source: Ambulatory Visit | Attending: Radiation Oncology | Admitting: Radiation Oncology

## 2022-04-10 DIAGNOSIS — C44321 Squamous cell carcinoma of skin of nose: Secondary | ICD-10-CM | POA: Diagnosis not present

## 2022-04-10 LAB — RAD ONC ARIA SESSION SUMMARY
Course Elapsed Days: 21
Plan Fractions Treated to Date: 16
Plan Prescribed Dose Per Fraction: 2.5 Gy
Plan Total Fractions Prescribed: 20
Plan Total Prescribed Dose: 50 Gy
Reference Point Dosage Given to Date: 40 Gy
Reference Point Session Dosage Given: 2.5 Gy
Session Number: 16

## 2022-04-10 MED ORDER — SILVER SULFADIAZINE 1 % EX CREA: TOPICAL_CREAM | Freq: Every day | CUTANEOUS | Status: DC

## 2022-04-11 ENCOUNTER — Ambulatory Visit
Admission: RE | Admit: 2022-04-11 | Discharge: 2022-04-11 | Disposition: A | Discharge status: Home / Self Care | Payer: Medicare Other | Source: Ambulatory Visit | Attending: Radiation Oncology | Admitting: Radiation Oncology

## 2022-04-11 ENCOUNTER — Other Ambulatory Visit: Payer: Self-pay

## 2022-04-11 DIAGNOSIS — C44321 Squamous cell carcinoma of skin of nose: Secondary | ICD-10-CM | POA: Diagnosis not present

## 2022-04-11 LAB — RAD ONC ARIA SESSION SUMMARY
Course Elapsed Days: 22
Plan Fractions Treated to Date: 17
Plan Prescribed Dose Per Fraction: 2.5 Gy
Plan Total Fractions Prescribed: 20
Plan Total Prescribed Dose: 50 Gy
Reference Point Dosage Given to Date: 42.5 Gy
Reference Point Session Dosage Given: 2.5 Gy
Session Number: 17

## 2022-04-12 ENCOUNTER — Telehealth: Payer: Self-pay | Admitting: Pharmacist

## 2022-04-12 ENCOUNTER — Ambulatory Visit
Admission: RE | Admit: 2022-04-12 | Discharge: 2022-04-12 | Disposition: A | Discharge status: Home / Self Care | Payer: Medicare Other | Source: Ambulatory Visit | Attending: Radiation Oncology | Admitting: Radiation Oncology

## 2022-04-12 ENCOUNTER — Other Ambulatory Visit: Payer: Self-pay

## 2022-04-12 DIAGNOSIS — Z85820 Personal history of malignant melanoma of skin: Secondary | ICD-10-CM | POA: Diagnosis not present

## 2022-04-12 DIAGNOSIS — L821 Other seborrheic keratosis: Secondary | ICD-10-CM | POA: Diagnosis not present

## 2022-04-12 DIAGNOSIS — L309 Dermatitis, unspecified: Secondary | ICD-10-CM | POA: Diagnosis not present

## 2022-04-12 DIAGNOSIS — C44629 Squamous cell carcinoma of skin of left upper limb, including shoulder: Secondary | ICD-10-CM | POA: Diagnosis not present

## 2022-04-12 DIAGNOSIS — Z85828 Personal history of other malignant neoplasm of skin: Secondary | ICD-10-CM | POA: Diagnosis not present

## 2022-04-12 DIAGNOSIS — C44321 Squamous cell carcinoma of skin of nose: Secondary | ICD-10-CM | POA: Diagnosis not present

## 2022-04-12 LAB — RAD ONC ARIA SESSION SUMMARY
Course Elapsed Days: 23
Plan Fractions Treated to Date: 18
Plan Prescribed Dose Per Fraction: 2.5 Gy
Plan Total Fractions Prescribed: 20
Plan Total Prescribed Dose: 50 Gy
Reference Point Dosage Given to Date: 45 Gy
Reference Point Session Dosage Given: 2.5 Gy
Session Number: 18

## 2022-04-12 NOTE — Telephone Encounter (Signed)
Patient called reports her BP started staying in 140/80 range. Last night it was 170/106 and at the radiation clinic it was high too (160/90). she is going for radiation for skin cancer on her nose. She finished 14 sessions and one more to go.  current medication  Diovan 80 mg twice daily Spironolactone 12.5 mg daily  Metoprolol XL 25 mg daily   Last night she took 2 tablets of 80 mg Diovan. Wendy [name] BP was high. She still has some hydralazine left from previous fill.   Suggest to take hydralazine 10 mg prn for  BP>180/90 and continue taking other BP medications regularly.

## 2022-04-13 ENCOUNTER — Other Ambulatory Visit: Payer: Self-pay

## 2022-04-13 ENCOUNTER — Ambulatory Visit
Admission: RE | Admit: 2022-04-13 | Discharge: 2022-04-13 | Disposition: A | Discharge status: Home / Self Care | Payer: Medicare Other | Source: Ambulatory Visit | Attending: Radiation Oncology | Admitting: Radiation Oncology

## 2022-04-13 DIAGNOSIS — C44321 Squamous cell carcinoma of skin of nose: Secondary | ICD-10-CM | POA: Diagnosis not present

## 2022-04-13 LAB — RAD ONC ARIA SESSION SUMMARY
Course Elapsed Days: 24
Plan Fractions Treated to Date: 19
Plan Prescribed Dose Per Fraction: 2.5 Gy
Plan Total Fractions Prescribed: 20
Plan Total Prescribed Dose: 50 Gy
Reference Point Dosage Given to Date: 47.5 Gy
Reference Point Session Dosage Given: 2.5 Gy
Session Number: 19

## 2022-04-13 NOTE — Telephone Encounter (Signed)
Patient is following up. She also wants to inform Karren Cobble that a form for Repatha is being faxed from [facility] script and she would like to confirm when this is received.

## 2022-04-14 ENCOUNTER — Other Ambulatory Visit: Payer: Self-pay

## 2022-04-14 ENCOUNTER — Ambulatory Visit
Admission: RE | Admit: 2022-04-14 | Discharge: 2022-04-14 | Disposition: A | Discharge status: Home / Self Care | Payer: Medicare Other | Source: Ambulatory Visit | Attending: Radiation Oncology | Admitting: Radiation Oncology

## 2022-04-14 DIAGNOSIS — Z510 Encounter for antineoplastic radiation therapy: Secondary | ICD-10-CM | POA: Diagnosis not present

## 2022-04-14 DIAGNOSIS — C44321 Squamous cell carcinoma of skin of nose: Secondary | ICD-10-CM | POA: Diagnosis not present

## 2022-04-14 LAB — RAD ONC ARIA SESSION SUMMARY
Course Elapsed Days: 25
Plan Fractions Treated to Date: 20
Plan Prescribed Dose Per Fraction: 2.5 Gy
Plan Total Fractions Prescribed: 20
Plan Total Prescribed Dose: 50 Gy
Reference Point Dosage Given to Date: 50 Gy
Reference Point Session Dosage Given: 2.5 Gy
Session Number: 20

## 2022-04-14 NOTE — Telephone Encounter (Signed)
Form is on onbase. PA request for Repatha, but pt is on Praluent?

## 2022-04-14 NOTE — Radiation Completion Notes (Signed)
Patient Name: Wendy [name], Wendy [name] MRN: [id number] Date of Birth: 12/21/1945 Referring Physician: Griselda Miner, M.D. Date of Service: 2022-04-14 Radiation Oncologist: Eppie Gibson, M.D. 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

## 2022-04-19 ENCOUNTER — Other Ambulatory Visit: Payer: Self-pay | Admitting: Obstetrics & Gynecology

## 2022-04-19 DIAGNOSIS — Z1231 Encounter for screening mammogram for malignant neoplasm of breast: Secondary | ICD-10-CM

## 2022-04-24 ENCOUNTER — Telehealth: Payer: Self-pay | Admitting: Cardiovascular Disease

## 2022-04-24 DIAGNOSIS — I10 Essential (primary) hypertension: Secondary | ICD-10-CM

## 2022-04-24 DIAGNOSIS — I6523 Occlusion and stenosis of bilateral carotid arteries: Secondary | ICD-10-CM

## 2022-04-24 DIAGNOSIS — E7800 Pure hypercholesterolemia, unspecified: Secondary | ICD-10-CM

## 2022-04-24 NOTE — Telephone Encounter (Signed)
New Message:      Patient would like for Wendy [name] to give her a call please.

## 2022-04-24 NOTE — Telephone Encounter (Signed)
Spoke with patient. Requests repeat lab work order to recheck renal function

## 2022-04-25 MED ORDER — REPATHA SURECLICK 140 MG/ML ~~LOC~~ SOAJ: 1.0000 mL | SUBCUTANEOUS | 3 refills | Status: DC

## 2022-04-25 NOTE — Addendum Note (Signed)
Addended by: Rollen Sox on: 04/25/2022 12:56 PM   Modules accepted: Orders

## 2022-04-25 NOTE — Telephone Encounter (Signed)
PA for Repatha approved through 02/26/2098

## 2022-04-27 NOTE — Telephone Encounter (Signed)
Patient called back. Repatha copay for 3 month supply is >1000 dollars and Praluent is >800 dollars.   Will pursue Leqvio application next.

## 2022-04-28 ENCOUNTER — Ambulatory Visit
Admission: RE | Admit: 2022-04-28 | Discharge: 2022-04-28 | Disposition: A | Discharge status: Home / Self Care | Payer: Medicare Other | Source: Ambulatory Visit | Attending: Obstetrics & Gynecology | Admitting: Obstetrics & Gynecology

## 2022-04-28 ENCOUNTER — Other Ambulatory Visit: Payer: Self-pay

## 2022-04-28 DIAGNOSIS — I6523 Occlusion and stenosis of bilateral carotid arteries: Secondary | ICD-10-CM | POA: Diagnosis not present

## 2022-04-28 DIAGNOSIS — E7800 Pure hypercholesterolemia, unspecified: Secondary | ICD-10-CM | POA: Diagnosis not present

## 2022-04-28 DIAGNOSIS — I10 Essential (primary) hypertension: Secondary | ICD-10-CM | POA: Diagnosis not present

## 2022-04-28 DIAGNOSIS — Z1231 Encounter for screening mammogram for malignant neoplasm of breast: Secondary | ICD-10-CM

## 2022-04-29 LAB — BASIC METABOLIC PANEL
BUN/Creatinine Ratio: 15 (ref 12–28)
BUN: 13 mg/dL (ref 8–27)
CO2: 24 mmol/L (ref 20–29)
Calcium: 10.2 mg/dL (ref 8.7–10.3)
Chloride: 95 mmol/L — ABNORMAL LOW (ref 96–106)
Creatinine, Ser: 0.88 mg/dL (ref 0.57–1.00)
Glucose: 83 mg/dL (ref 70–99)
Potassium: 5 mmol/L (ref 3.5–5.2)
Sodium: 134 mmol/L (ref 134–144)
eGFR: 68 mL/min/{1.73_m2} (ref >59)

## 2022-04-29 LAB — LIPID PANEL
Chol/HDL Ratio: 4 ratio (ref 0.0–4.4)
Cholesterol, Total: 181 mg/dL (ref 100–199)
HDL: 45 mg/dL (ref >39)
LDL Chol Calc (NIH): 100 mg/dL — ABNORMAL HIGH (ref 0–99)
Triglycerides: 210 mg/dL — ABNORMAL HIGH (ref 0–149)
VLDL Cholesterol Cal: 36 mg/dL (ref 5–40)

## 2022-05-01 ENCOUNTER — Telehealth: Payer: Self-pay | Admitting: Pharmacist

## 2022-05-01 NOTE — Telephone Encounter (Signed)
Patient's Repatha is a 600 dollar copay. Requests to see what cost of Leqvio would be.  Leqvio enrollment form faxed to service center

## 2022-05-03 ENCOUNTER — Telehealth: Payer: Self-pay | Admitting: Pharmacist

## 2022-05-03 NOTE — Telephone Encounter (Signed)
Placing orders for [employer] [facility]

## 2022-05-03 NOTE — Progress Notes (Addendum)
Ms. Colasurdo presents to clinic for follow up for completion of radiation treatment to her nose. She completed treatment on 04-14-22.  Pain issues, if any: Denies Weight changes, if any:  Wt Readings from Last 3 Encounters:  05/17/22 143 lb (64.9 kg)  05/17/22 143 lb 6.4 oz (65 kg)  03/01/22 143 lb 12.8 oz (65.2 kg)   Swallowing issues, if any: Denies any new issues or concerns. Reports she occasionally will get choked when swallowing, but states this was a concern even before she received radiation to her nose.  Skin: Reports she had some peeling and bleeding from incisional site, but skin is now healed/intact Dermatology visit?: Scheduled to see them in April; has visits every 3 months  Other notable issues, if any: Overall reports she's doing well and pleased with how she has recovered

## 2022-05-04 ENCOUNTER — Telehealth: Payer: Self-pay | Admitting: Pharmacy Technician

## 2022-05-04 NOTE — Telephone Encounter (Signed)
Called patient and lmom. Lab results normal and her Marion Downer was approved.

## 2022-05-04 NOTE — Telephone Encounter (Signed)
Dimas Millin note: Patient will be scheduled as soon as possible  Auth Submission: NO AUTH NEEDED Payer: medicare a/b & [employer] [place] Medication & CPT/J Code(s) submitted: Leqvio ([place]) [phone] Route of submission (phone, fax, portal):  Phone # Fax # Auth type: Buy/Bill Units/visits requested: 3 Reference number: leqvio biv Approval from: 05/04/22 to 05/04/23

## 2022-05-05 ENCOUNTER — Telehealth: Payer: Self-pay | Admitting: Cardiovascular Disease

## 2022-05-05 NOTE — Telephone Encounter (Signed)
Follow Up: ° ° ° ° ° °Patient is returning call from yesterday °

## 2022-05-05 NOTE — Telephone Encounter (Signed)
Wrong provider

## 2022-05-08 NOTE — Telephone Encounter (Signed)
Returned call to patient on 05/05/22. She had no questions, but was attempting to return call to PharmD who had left her a voicemail letting her know that her [facility] Downer had been approved. Message received and no further questions.

## 2022-05-17 ENCOUNTER — Encounter: Payer: Self-pay | Admitting: Radiation Oncology

## 2022-05-17 ENCOUNTER — Other Ambulatory Visit: Payer: Self-pay

## 2022-05-17 ENCOUNTER — Ambulatory Visit (INDEPENDENT_AMBULATORY_CARE_PROVIDER_SITE_OTHER): Payer: Medicare Other

## 2022-05-17 ENCOUNTER — Ambulatory Visit
Admission: RE | Admit: 2022-05-17 | Discharge: 2022-05-17 | Disposition: A | Discharge status: Home / Self Care | Payer: Medicare Other | Source: Ambulatory Visit | Attending: Radiation Oncology | Admitting: Radiation Oncology

## 2022-05-17 VITALS — BP 101/69 | HR 56 | Temp 97.6°F | Resp 18 | Ht 60.0 in | Wt 143.4 lb

## 2022-05-17 VITALS — BP 167/76 | HR 77 | Temp 97.3°F | Resp 18 | Ht 60.0 in | Wt 143.0 lb

## 2022-05-17 DIAGNOSIS — C44321 Squamous cell carcinoma of skin of nose: Secondary | ICD-10-CM | POA: Insufficient documentation

## 2022-05-17 DIAGNOSIS — Z923 Personal history of irradiation: Secondary | ICD-10-CM | POA: Diagnosis not present

## 2022-05-17 DIAGNOSIS — E782 Mixed hyperlipidemia: Secondary | ICD-10-CM

## 2022-05-17 DIAGNOSIS — Z7982 Long term (current) use of aspirin: Secondary | ICD-10-CM | POA: Diagnosis not present

## 2022-05-17 DIAGNOSIS — Z79899 Other long term (current) drug therapy: Secondary | ICD-10-CM | POA: Insufficient documentation

## 2022-05-17 DIAGNOSIS — I6523 Occlusion and stenosis of bilateral carotid arteries: Secondary | ICD-10-CM

## 2022-05-17 MED ORDER — INCLISIRAN SODIUM 284 MG/1.5ML ~~LOC~~ SOSY
284.0000 mg | PREFILLED_SYRINGE | Freq: Once | SUBCUTANEOUS | Status: AC
  Administered 2022-05-17: 284 mg via SUBCUTANEOUS
  Filled 2022-05-17: qty 1.5

## 2022-05-17 NOTE — Progress Notes (Signed)
Radiation Oncology         [phone] ________________________________  Name: Wendy [name] MRN: [id number]  Date: 05/17/2022  DOB: Jul 12, 1945  Follow-Up Visit Note  Outpatient  CC: Reynold Bowen, MD  Griselda Miner, MD  Diagnosis and Prior Radiotherapy:    ICD-10-CM   1. 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  She is doing well - happy with how she healed from RT                         ALLERGIES:  is allergic to hctz [hydrochlorothiazide], codeine, hydrocodone-acetaminophen, lisinopril, neomycin, other, oxycodone, pravastatin sodium, and ciprofloxacin.  Meds: Current Outpatient Medications  Medication Sig Dispense Refill   amLODipine (NORVASC) 5 MG tablet Take 5 mg by mouth daily.     chlorthalidone (HYGROTON) 25 MG tablet Take 12.5 mg by mouth every morning.     PRALUENT 75 MG/ML SOAJ Inject 1 Syringe into the skin every 14 (fourteen) days.     ALPRAZolam (XANAX) 0.25 MG tablet Take 1 tablet by mouth daily as needed.     ascorbic acid (VITAMIN C) 500 MG tablet Take 1 tablet (500 mg total) by mouth daily. 90 tablet 0   aspirin 81 MG tablet Take 81 mg by mouth daily.     b complex vitamins tablet Take 1 tablet by mouth daily.     Cholecalciferol (VITAMIN D) 2000 UNITS CAPS Take 1 capsule by mouth daily.     Coenzyme Q10 (COQ10) 200 MG CAPS Take 1 capsule by mouth daily.     cyclobenzaprine (FLEXERIL) 10 MG tablet Take 10 mg by mouth daily as needed.     DIGEST ENZYMES-ANTICHOLINERGIC PO Take 1 capsule by mouth 3 (three) times daily. PRN     hydrALAZINE  (APRESOLINE) 10 MG tablet Take 1 tablet (10 mg total) by mouth 3 (three) times daily. 90 tablet 0   metoprolol succinate (TOPROL-XL) 25 MG 24 hr tablet Take 1 tablet (25 mg total) by mouth daily. 90 tablet 1   Misc Natural Products (TART CHERRY ADVANCED) CAPS Take 2 capsules by mouth daily.     Multiple Vitamins-Minerals (MULTIVITAMIN & MINERAL PO) Take 1 tablet by mouth daily.     Omega-3 Fatty Acids (FISH OIL PO) Take 1 capsule by mouth daily.     Red Yeast Rice 600 MG CAPS Take 2 capsules by mouth daily.     rosuvastatin (CRESTOR) 5 MG tablet Take 5 mg by mouth once a week.     spironolactone (ALDACTONE) 25 MG tablet Take 1 tablet (25 mg total) by mouth daily. 90 tablet 3   valsartan (DIOVAN) 80 MG tablet Take 1 tablet (80 mg total) by mouth 2 (two) times daily. 180 tablet 3   zinc sulfate 220 (50 Zn) MG capsule Take 1 capsule by mouth daily.     No current facility-administered medications for this encounter.    Physical Findings: The patient is in no acute distress. Patient is alert and oriented.  height is 5' (1.524 m) and weight is 143 lb (64.9 kg).  Her temperature is 97.3 F (36.3 C) (abnormal). Her blood pressure is 167/76 (abnormal) and her pulse is 77. Her respiration is 18 and oxygen saturation is 100%. .    Skin over her nose has healed well.  No residual erythema or desquamation.   No residual lesions consistent with cancer over her nose and no discernible mucositis in her nasal cavity  Lab Findings: Lab Results  Component Value Date   WBC 9.2 01/31/2022   HGB 13.8 01/31/2022   HCT 42.3 01/31/2022   MCV 90.0 01/31/2022   PLT 248 01/31/2022    Radiographic Findings: MM 3D SCREEN BREAST BILATERAL  Result Date: 05/02/2022 CLINICAL DATA:  Screening. EXAM: DIGITAL SCREENING BILATERAL MAMMOGRAM WITH TOMOSYNTHESIS AND CAD TECHNIQUE: Bilateral screening digital craniocaudal and mediolateral oblique mammograms were obtained. Bilateral screening digital breast tomosynthesis was  performed. The images were evaluated with computer-aided detection. COMPARISON:  Previous exam(s). ACR Breast Density Category b: There are scattered areas of fibroglandular density. FINDINGS: There are no findings suspicious for malignancy. IMPRESSION: No mammographic evidence of malignancy. A result letter of this screening mammogram will be mailed directly to the patient. RECOMMENDATION: Screening mammogram in one year. (Code:[id number]-B-01Y) BI-RADS CATEGORY  1: Negative. Electronically Signed   By: Abelardo Diesel M.D.   On: 05/02/2022 11:11    Impression/Plan: She has healed well from radiation therapy.  I recommended that she apply vitamin E cream or oil to her nose twice a day for 2 more months for further healing.  She will use good sun hygiene moving forward and continue to follow closely with her dermatologist who is seeing her every 3 months.  I will see her back as needed.  She is pleased with this plan and expressed gratitude for the care that she received here.  On date of service, in total, I spent 25 minutes on this encounter. Patient was seen in person.  _____________________________________   Eppie Gibson, MD

## 2022-05-17 NOTE — Progress Notes (Signed)
Diagnosis: Hyperlipidemia  Provider:  Praveen Mannam MD  Procedure: Injection  Leqvio (inclisiran), Dose: 284 mg, Site: subcutaneous, Number of injections: 1  Post Care: Observation period completed  Discharge: Condition: Good, Destination: Home . AVS Provided  Performed by:  Garlin Batdorf, RN       

## 2022-05-19 DIAGNOSIS — H902 Conductive hearing loss, unspecified: Secondary | ICD-10-CM | POA: Diagnosis not present

## 2022-05-19 DIAGNOSIS — H6123 Impacted cerumen, bilateral: Secondary | ICD-10-CM | POA: Diagnosis not present

## 2022-05-24 ENCOUNTER — Telehealth: Payer: Self-pay | Admitting: Cardiovascular Disease

## 2022-05-24 DIAGNOSIS — E7800 Pure hypercholesterolemia, unspecified: Secondary | ICD-10-CM

## 2022-05-24 NOTE — Telephone Encounter (Signed)
Pt c/o medication issue:  1. Name of Medication: Leqvio   2. How are you currently taking this medication (dosage and times per day)?   3. Are you having a reaction (difficulty breathing--STAT)? No  4. What is your medication issue? Patient stated they have not felt good since having the shot. Patient stated that she has pain in her neck and back. The patient would like to get the second shot in June, but she is worried that the pain may be from getting the injection. Please advise

## 2022-05-24 NOTE — Telephone Encounter (Signed)
Pt intolerant to pravastatin. Praluent and Repatha cost prohibitive. Has a Part D plan with really poor coverage. $545 deductible then 25% copay on Tier 3 meds. Has supplement plan so Wendy [name] is free.   Labs checked on 3/1, LDL was 100, pt reports she wasn't taking rosuvastatin once weekly at that time because she would forget. Also not taking Praluent or RYR which are on her med list, these have been removed. March labs are essentially her baseline. Her LDL goal is < 70 due to elevated CAC.   Reports some pain she isn't sure is coming from her Leqvio. Also reports recently finishing radiation which left her feeling fatigued. Discussed that Leqvio does not have higher rates of myalgias compared to placebo in clinical trials and that actual med clears out of her body within 1-2 days which she was encouraged to hear. She will go for her 2nd Leqvio injection in June, I have scheduled f/u labs after to assess efficacy. If she has issues with 2nd Leqvio dose, can revisit rosuvastatin or ezetimibe which pt is willing to try.  Also reports she hasn't been on chlorthalidone for a while, med list updated.   Forwarding to [place] as an [place] who has been following pt's lipids and BP.

## 2022-06-08 DIAGNOSIS — D225 Melanocytic nevi of trunk: Secondary | ICD-10-CM | POA: Diagnosis not present

## 2022-06-08 DIAGNOSIS — Z85828 Personal history of other malignant neoplasm of skin: Secondary | ICD-10-CM | POA: Diagnosis not present

## 2022-06-08 DIAGNOSIS — L905 Scar conditions and fibrosis of skin: Secondary | ICD-10-CM | POA: Diagnosis not present

## 2022-06-08 DIAGNOSIS — L821 Other seborrheic keratosis: Secondary | ICD-10-CM | POA: Diagnosis not present

## 2022-06-08 DIAGNOSIS — D1801 Hemangioma of skin and subcutaneous tissue: Secondary | ICD-10-CM | POA: Diagnosis not present

## 2022-06-08 DIAGNOSIS — Z85820 Personal history of malignant melanoma of skin: Secondary | ICD-10-CM | POA: Diagnosis not present

## 2022-06-08 DIAGNOSIS — L814 Other melanin hyperpigmentation: Secondary | ICD-10-CM | POA: Diagnosis not present

## 2022-07-14 DIAGNOSIS — M542 Cervicalgia: Secondary | ICD-10-CM | POA: Diagnosis not present

## 2022-07-14 DIAGNOSIS — Z6828 Body mass index (BMI) 28.0-28.9, adult: Secondary | ICD-10-CM | POA: Diagnosis not present

## 2022-07-18 DIAGNOSIS — I6523 Occlusion and stenosis of bilateral carotid arteries: Secondary | ICD-10-CM | POA: Diagnosis not present

## 2022-07-18 DIAGNOSIS — I700 Atherosclerosis of aorta: Secondary | ICD-10-CM | POA: Diagnosis not present

## 2022-07-18 DIAGNOSIS — E785 Hyperlipidemia, unspecified: Secondary | ICD-10-CM | POA: Diagnosis not present

## 2022-07-18 DIAGNOSIS — M5416 Radiculopathy, lumbar region: Secondary | ICD-10-CM | POA: Diagnosis not present

## 2022-07-18 DIAGNOSIS — E559 Vitamin D deficiency, unspecified: Secondary | ICD-10-CM | POA: Diagnosis not present

## 2022-07-18 DIAGNOSIS — I679 Cerebrovascular disease, unspecified: Secondary | ICD-10-CM | POA: Diagnosis not present

## 2022-07-18 DIAGNOSIS — I10 Essential (primary) hypertension: Secondary | ICD-10-CM | POA: Diagnosis not present

## 2022-07-18 DIAGNOSIS — K219 Gastro-esophageal reflux disease without esophagitis: Secondary | ICD-10-CM | POA: Diagnosis not present

## 2022-07-18 DIAGNOSIS — T50905A Adverse effect of unspecified drugs, medicaments and biological substances, initial encounter: Secondary | ICD-10-CM | POA: Diagnosis not present

## 2022-07-18 DIAGNOSIS — C4330 Malignant melanoma of unspecified part of face: Secondary | ICD-10-CM | POA: Diagnosis not present

## 2022-07-18 DIAGNOSIS — M609 Myositis, unspecified: Secondary | ICD-10-CM | POA: Diagnosis not present

## 2022-07-18 DIAGNOSIS — I2584 Coronary atherosclerosis due to calcified coronary lesion: Secondary | ICD-10-CM | POA: Diagnosis not present

## 2022-08-09 NOTE — Therapy (Signed)
OUTPATIENT PHYSICAL THERAPY CERVICAL EVALUATION   Patient Name: Wendy [name] MRN: [id number] DOB:[birth year], 77 y.o., female Today's Date: 08/09/2022  END OF SESSION:   Past Medical History:  Diagnosis Date   Anxiety    Atypical chest pain    B12 deficiency    Cervical spondylosis without myelopathy    Diverticulosis of colon    Dysesthesia    GERD (gastroesophageal reflux disease)    Hypercholesteremia    Hypertension    IBS (irritable bowel syndrome)    Malignant melanoma (HCC)    Vitreous hemorrhage (HCC)    Past Surgical History:  Procedure Laterality Date   anterior cervical discectomy and fusion  1996   Dr. 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   Chronic cystitis 01/18/2022   Degeneration of lumbar intervertebral disc 01/18/2022   Lumbar spondylosis 01/18/2022   Lumbar radiculopathy 01/18/2022   Constipation 01/18/2022   Myalgia due to statin 01/18/2022   Aortic atherosclerosis (HCC) 01/18/2022   Hyponatremia 04/30/2021   Weakness 04/30/2021   COVID-19 virus infection 04/30/2021   Pain in wrist 01/16/2019   Neck pain 01/10/2019   Impacted cerumen of both ears 08/07/2018   Eustachian tube dysfunction, bilateral 08/07/2018   Mixed hyperlipidemia 03/30/2016   Vaginitis, atrophic 05/26/2015   Carotid artery disease (HCC) 02/15/2015   Voiding dysfunction 12/11/2014   Third degree hemorrhoids 10/13/2014   Procidentia of uterus 09/23/2014   Occlusion and stenosis of carotid artery without mention of cerebral infarction 09/02/2013    Bilateral dry eyes 07/14/2012   Actinic keratoses 05/25/2012   Nuclear cataract, nonsenile 12/25/2011   Other seborrheic keratosis 09/18/2011   History of malignant melanoma of skin 05/11/2011   OBSTRUCTIVE SLEEP APNEA 11/19/2009   SNORING 10/11/2009   Abnormal respiratory rate 10/11/2009   LACERATION OF FINGER 09/03/2009   DYSESTHESIA 05/23/2008   MALIGNANT MELANOMA 01/13/2008   ANXIETY 01/13/2008   VITREOUS HEMORRHAGE 01/13/2008   HTN (hypertension) 01/13/2008   DIVERTICULOSIS OF COLON 01/13/2008   IRRITABLE BOWEL SYNDROME 01/13/2008   CERVICAL SPONDYLOSIS WITHOUT MYELOPATHY 01/13/2008   CHEST PAIN, ATYPICAL 01/13/2008   HYPERCHOLESTEROLEMIA 01/10/2008   GERD 01/10/2008    PCP: Dr Adrian Prince  REFERRING PROVIDER: Dr Barnett Abu   REFERRING DIAG: Cervicalgia  THERAPY DIAG:  No diagnosis found.  Rationale for Evaluation and Treatment: Rehabilitation  ONSET DATE: 04/13/22  SUBJECTIVE:  SUBJECTIVE STATEMENT: Patient reports increase in neck pain following yard work ~ 3-4 months ago. She is better with Advil but it is worse in the evening. She has some degenerative changes in the cervical spine.  Hand dominance: Right  PERTINENT HISTORY:  HTN; cervical sx with fusion 1996; arthritis; melanoma   PAIN:  Are you having pain? Yes: NPRS scale: 4/10; gets up to 7-8/10 Pain location: posterior cervical spine  Pain description: nagging; into head; stiffness  Aggravating factors: worse first thing in the morning and toward the end of the day; activities  Relieving factors: OTC antiinflammatory; moving   PRECAUTIONS: None  WEIGHT BEARING RESTRICTIONS: No  FALLS:  Has patient fallen in last 6 months? 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 Cervical spondylosis, slightly progressed at the C3-4 and C4-5 levels. 2. Mild-moderate canal stenosis at C4-5 with moderate right foraminal stenosis. 3. Mild-moderate right foraminal stenosis at C3-4. 4. Prior C5-C7 ACDF with solid arthrodesis.  PATIENT SURVEYS:  FOTO   COGNITION: Overall cognitive status: Within functional limits for tasks assessed  SENSATION: Intermittent tingling in Rt hand   POSTURE: Patient presents with head forward posture with increased thoracic kyphosis; shoulders rounded and elevated; scapulae abducted and rotated along the thoracic spine; head of the humerus anterior in orientation.   PALPATION: Muscular tightness in ant/lat/post cervical musculature; pecs; upper trap; leveator   CERVICAL ROM:   Active ROM A/PROM (deg) eval  Flexion 41  Extension 40 tight; pain  Right lateral flexion 25 tight   Left lateral flexion 26 tight  Right rotation 51 tight  Left rotation 27 tight    (Blank rows = not tested)  UPPER EXTREMITY STRENGTH: no pain with resistive testing   Active ROM Right eval Left eval  Shoulder flexion 5 5  Shoulder extension 5 5  Shoulder abduction 5 5  Shoulder adduction    Shoulder extension    Shoulder internal rotation    Shoulder external rotation    Middle trap  4 4  Lower trap  4 4  Wrist flexion    Wrist extension    Wrist ulnar deviation    Wrist radial deviation    Wrist pronation    Wrist supination     (Blank rows = not tested)  UPPER EXTREMITY ROM: end range tightness as noted   MMT Right eval Left eval  Shoulder flexion Tight  Tight   Shoulder extension    Shoulder abduction Tight  Tight   Shoulder  adduction    Shoulder extension    Shoulder internal rotation Tight  Tight   Shoulder external rotation    Middle trapezius    Lower trapezius    Elbow flexion    Elbow extension    Wrist flexion    Wrist extension    Wrist ulnar deviation    Wrist radial deviation    Wrist pronation    Wrist supination    Grip strength     (Blank rows = not tested)  CERVICAL SPECIAL TESTS:  Upper limb tension test (ULTT): Positive - tight R > L    [facility] Adult PT Treatment:  DATE: 08/10/22 Therapeutic Exercise: Supine  Chin tuck 10 sec x 10  Standing  Chin tuck with noodle 5 sec x 5 Scap squeeze with noodle 10 sec x 10  Doorway stretch 3 positions 30 sec x 2  Manual Therapy: STM through ant/lat/post cervical musculature pt supine  Neuromuscular re-ed: Education re- importance of upright posture with improved strength in posterior shoulder girdle musculature  Modalities: Uses heat/ice at home. Does not like TENS  Self Care: Use of noodle for sitting  Use of pillows for electronics when sitting    PATIENT EDUCATION:  Education details: POC; HEP Person educated: Patient Education method: Programmer, multimedia, Demonstration, Tactile cues, Verbal cues, and Handouts Education comprehension: verbalized understanding, returned demonstration, verbal cues required, tactile cues required, and needs further education  HOME EXERCISE PROGRAM: Access Code: [email or web address] URL: [email or web address] Date: 08/10/2022 Prepared by: Corlis Leak  Exercises - Supine Cervical Retraction with Towel  - 2 x daily - 7 x weekly - 1 sets - 5-10 reps - 10 sec  hold - Seated Cervical Retraction  - 2 x daily - 7 x weekly - 1-2 sets - 5-10 reps - 10 sec  hold - Seated Scapular Retraction  - 2 x daily - 7 x weekly - 1-2 sets - 10 reps - 10 sec  hold - Doorway Pec Stretch at 60 Degrees Abduction  - 3 x daily - 7 x weekly - 1 sets - 3 reps - Doorway Pec Stretch at 90  Degrees Abduction  - 3 x daily - 7 x weekly - 1 sets - 3 reps - 30 seconds  hold - Doorway Pec Stretch at 120 Degrees Abduction  - 3 x daily - 7 x weekly - 1 sets - 3 reps - 30 second hold  hold  Patient Education - Trigger Point Dry Needling  ASSESSMENT:  CLINICAL IMPRESSION: Patient is a 77 y.o. female who was seen today for physical therapy evaluation and treatment for cervicalgia. She has a history of ACDF C5/6 1996 and intermittent cervical pain and dysfunction since that time. Patient reports irritation of cervical symptoms with yard work ~ 4 months ago. She has muscular tightness and pain with movement. Symptoms are worse first thing in the morning and late day. She has some headaches related to tightness in the neck. Patient presents with poor posture and alignment; limited cervical and thoracic/shoulder ROM/mobility; muscular tightness to palpation; postural weakness. Patient will benefit from PT to address problems identified.   OBJECTIVE IMPAIRMENTS: decreased activity tolerance, decreased mobility, decreased ROM, decreased strength, increased fascial restrictions, increased muscle spasms, impaired flexibility, improper body mechanics, postural dysfunction, and pain.   ACTIVITY LIMITATIONS: carrying, lifting, sitting, standing, and sleeping  PARTICIPATION LIMITATIONS: meal prep, cleaning, laundry, and yard work  PERSONAL FACTORS: Past/current experiences and Time since onset of injury/illness/exacerbation are also affecting patient's functional outcome.   REHAB POTENTIAL: Good  CLINICAL DECISION MAKING: Stable/uncomplicated  EVALUATION COMPLEXITY: Low   GOALS: Goals reviewed with patient? Yes  SHORT TERM GOALS: Target date: 09/21/2022  Independent in initial HEP  Baseline:  Goal status: INITIAL  2.  75% decrease in frequency and intensity of headaches  Baseline:  Goal status: INITIAL   LONG TERM GOALS: Target date: 11/02/2022   Decrease cervical pain by 75-100%  allowing patient to return to all normal functional activities  Baseline:  Goal status: INITIAL  2.  Increase cervical ROM to WFL's with no pain  Baseline:  Goal status: INITIAL  3.  Improve posture and alignment with patient  to demonstrate improved upright posture with posterior shoulder girdle engaged  Baseline:  Goal status: INITIAL  4.  5/5 strength posterior shoulder girdle in middle and lower traps  Baseline:  Goal status: INITIAL  5.  Independent in HEP  Baseline:  Goal status: INITIAL  6.  Improve functional limitation score to 56 Baseline: 44 Goal status: INITIAL   PLAN:  PT FREQUENCY: 2x/week  PT DURATION: 12 weeks  PLANNED INTERVENTIONS: Therapeutic exercises, Therapeutic activity, Neuromuscular re-education, Balance training, Gait training, Patient/Family education, Self Care, Joint mobilization, Aquatic Therapy, Dry Needling, Electrical stimulation, Spinal mobilization, Cryotherapy, Moist heat, Taping, Ultrasound, Ionotophoresis 4mg /ml Dexamethasone, Manual therapy, and Re-evaluation  PLAN FOR NEXT SESSION: review and progress exercises program; continue with spine care and body mechanics education; manual work, DN, modalities as indicated    [employer], PT 08/09/2022, 3:08 PM

## 2022-08-10 ENCOUNTER — Encounter: Payer: Self-pay | Admitting: Rehabilitative and Restorative Service Providers"

## 2022-08-10 ENCOUNTER — Ambulatory Visit: Payer: Medicare Other | Attending: Neurological Surgery | Admitting: Rehabilitative and Restorative Service Providers"

## 2022-08-10 ENCOUNTER — Other Ambulatory Visit: Payer: Self-pay

## 2022-08-10 DIAGNOSIS — M6281 Muscle weakness (generalized): Secondary | ICD-10-CM | POA: Diagnosis not present

## 2022-08-10 DIAGNOSIS — R293 Abnormal posture: Secondary | ICD-10-CM | POA: Insufficient documentation

## 2022-08-10 DIAGNOSIS — M542 Cervicalgia: Secondary | ICD-10-CM | POA: Diagnosis not present

## 2022-08-10 DIAGNOSIS — R29898 Other symptoms and signs involving the musculoskeletal system: Secondary | ICD-10-CM | POA: Diagnosis not present

## 2022-08-12 ENCOUNTER — Other Ambulatory Visit: Payer: Self-pay | Admitting: Cardiovascular Disease

## 2022-08-12 DIAGNOSIS — I10 Essential (primary) hypertension: Secondary | ICD-10-CM

## 2022-08-17 ENCOUNTER — Ambulatory Visit: Payer: Medicare Other

## 2022-08-21 DIAGNOSIS — H6123 Impacted cerumen, bilateral: Secondary | ICD-10-CM | POA: Diagnosis not present

## 2022-08-21 DIAGNOSIS — H903 Sensorineural hearing loss, bilateral: Secondary | ICD-10-CM | POA: Diagnosis not present

## 2022-08-22 ENCOUNTER — Encounter: Payer: Self-pay | Admitting: Rehabilitative and Restorative Service Providers"

## 2022-08-22 ENCOUNTER — Ambulatory Visit: Payer: Medicare Other | Admitting: Rehabilitative and Restorative Service Providers"

## 2022-08-22 ENCOUNTER — Ambulatory Visit: Payer: Medicare Other

## 2022-08-22 DIAGNOSIS — R293 Abnormal posture: Secondary | ICD-10-CM

## 2022-08-22 DIAGNOSIS — M542 Cervicalgia: Secondary | ICD-10-CM

## 2022-08-22 DIAGNOSIS — R29898 Other symptoms and signs involving the musculoskeletal system: Secondary | ICD-10-CM

## 2022-08-22 DIAGNOSIS — M6281 Muscle weakness (generalized): Secondary | ICD-10-CM

## 2022-08-22 MED ORDER — INCLISIRAN SODIUM 284 MG/1.5ML ~~LOC~~ SOSY: 284.0000 mg | PREFILLED_SYRINGE | Freq: Once | SUBCUTANEOUS | Status: DC

## 2022-08-22 NOTE — Therapy (Signed)
OUTPATIENT PHYSICAL THERAPY CERVICAL TREATMENT   Patient Name: Wendy [name] MRN: [id number] DOB:Jul 17, 1945, 77 y.o., female Today's Date: 08/22/2022  END OF SESSION:  PT End of Session - 08/22/22 1310     Visit Number 2    Number of Visits 24    Date for PT Re-Evaluation 11/02/22    Authorization Type medicare + supplemental    Progress Note Due on Visit 10    PT Start Time 1311    PT Stop Time 1400    PT Time Calculation (min) 49 min    Activity Tolerance Patient tolerated treatment well             Past Medical History:  Diagnosis Date   Anxiety    Atypical chest pain    B12 deficiency    Cervical spondylosis without myelopathy    Diverticulosis of colon    Dysesthesia    GERD (gastroesophageal reflux disease)    Hypercholesteremia    Hypertension    IBS (irritable bowel syndrome)    Malignant melanoma (HCC)    Vitreous hemorrhage (HCC)    Past Surgical History:  Procedure Laterality Date   anterior cervical discectomy and fusion  1996   Dr. 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   Chronic cystitis 01/18/2022   Degeneration of lumbar intervertebral disc 01/18/2022   Lumbar spondylosis 01/18/2022   Lumbar radiculopathy 01/18/2022   Constipation 01/18/2022   Myalgia due to statin 01/18/2022   Aortic atherosclerosis (HCC) 01/18/2022   Hyponatremia 04/30/2021   Weakness 04/30/2021   COVID-19 virus infection 04/30/2021   Pain in wrist 01/16/2019   Neck pain 01/10/2019   Impacted cerumen of both ears 08/07/2018   Eustachian tube  dysfunction, bilateral 08/07/2018   Mixed hyperlipidemia 03/30/2016   Vaginitis, atrophic 05/26/2015   Carotid artery disease (HCC) 02/15/2015   Voiding dysfunction 12/11/2014   Third degree hemorrhoids 10/13/2014   Procidentia of uterus 09/23/2014   Occlusion and stenosis of carotid artery without mention of cerebral infarction 09/02/2013   Bilateral dry eyes 07/14/2012   Actinic keratoses 05/25/2012   Nuclear cataract, nonsenile 12/25/2011   Other seborrheic keratosis 09/18/2011   History of malignant melanoma of skin 05/11/2011   OBSTRUCTIVE SLEEP APNEA 11/19/2009   SNORING 10/11/2009   Abnormal respiratory rate 10/11/2009   LACERATION OF FINGER 09/03/2009   DYSESTHESIA 05/23/2008   MALIGNANT MELANOMA 01/13/2008   ANXIETY 01/13/2008   VITREOUS HEMORRHAGE 01/13/2008   HTN (hypertension) 01/13/2008   DIVERTICULOSIS OF COLON 01/13/2008   IRRITABLE BOWEL SYNDROME 01/13/2008   CERVICAL SPONDYLOSIS WITHOUT MYELOPATHY 01/13/2008   CHEST PAIN, ATYPICAL 01/13/2008   HYPERCHOLESTEROLEMIA 01/10/2008   GERD 01/10/2008    PCP: Dr Adrian Prince  REFERRING PROVIDER: Dr Barnett Abu   REFERRING DIAG: Cervicalgia  THERAPY DIAG:  Cervicalgia  Other symptoms and signs involving the musculoskeletal system  Muscle weakness (generalized)  Abnormal posture  Rationale for Evaluation and Treatment: Rehabilitation  ONSET DATE: 04/13/22  SUBJECTIVE:  SUBJECTIVE STATEMENT: Patient reports that he neck is "not good". She was on vacation last week. She has worked on her exercises some.    EVAL: increase in neck pain following yard work ~ 3-4 months ago. She is better with Advil but it is worse in the evening. She has some degenerative changes in the cervical spine.  Hand dominance:  Right  PERTINENT HISTORY:  HTN; cervical sx with fusion 1996; arthritis; melanoma   PAIN:  Are you having pain? Yes: NPRS scale: 6-7/10; gets up to 7-8/10 Pain location: posterior cervical spine  Pain description: nagging; into head; stiffness  Aggravating factors: worse first thing in the morning and toward the end of the day; activities  Relieving factors: OTC antiinflammatory; moving   PRECAUTIONS: None  WEIGHT BEARING RESTRICTIONS: No  FALLS:  Has patient fallen in last 6 months? 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 Cervical spondylosis, slightly progressed at the C3-4 and C4-5 levels. 2. Mild-moderate canal stenosis at C4-5 with moderate right foraminal stenosis. 3. Mild-moderate right foraminal stenosis at C3-4. 4. Prior C5-C7 ACDF with solid arthrodesis.  PATIENT SURVEYS:  FOTO not completed  SENSATION: Intermittent tingling in Rt hand   POSTURE: Patient presents with head forward posture with increased thoracic kyphosis; shoulders rounded and elevated; scapulae abducted and rotated along the thoracic spine; head of the humerus anterior in orientation.   PALPATION: Muscular tightness in ant/lat/post cervical musculature; pecs; upper trap; leveator   CERVICAL ROM:   Active ROM A/PROM (deg) eval  Flexion 41  Extension 40 tight; pain  Right lateral flexion 25 tight   Left lateral flexion 26 tight  Right rotation 51 tight  Left rotation 27 tight    (Blank rows = not tested)  UPPER EXTREMITY STRENGTH: no pain with resistive testing   Active ROM Right eval Left eval  Shoulder flexion 5 5  Shoulder extension 5 5  Shoulder abduction 5 5   Shoulder adduction    Shoulder extension    Shoulder internal rotation    Shoulder external rotation    Middle trap  4 4  Lower trap  4 4  Wrist flexion    Wrist extension    Wrist ulnar deviation    Wrist radial deviation    Wrist pronation    Wrist supination     (Blank rows = not tested)  UPPER EXTREMITY ROM: end range tightness as noted   MMT Right eval Left eval  Shoulder flexion Tight  Tight   Shoulder extension    Shoulder abduction Tight  Tight   Shoulder adduction    Shoulder extension    Shoulder internal rotation Tight  Tight   Shoulder external rotation    Middle trapezius    Lower trapezius    Elbow flexion    Elbow extension    Wrist flexion    Wrist extension    Wrist ulnar deviation    Wrist radial deviation    Wrist pronation    Wrist supination    Grip strength     (Blank rows = not tested)  CERVICAL SPECIAL TESTS:  Upper limb tension test (ULTT): Positive - tight R > L  [facility] Adult PT Treatment:                                                DATE: 08/22/22 Therapeutic Exercise: Supine  Chin tuck 10 sec x 10  Standing  Chin tuck with noodle 5 sec x 5 Scap squeeze with noodle 10 sec x 10  Doorway stretch 3 positions 30 sec x 2  Manual Therapy: STM through ant/lat/post cervical musculature pt supine  Skilled palpation to assess response to manual work and DN Trigger Point Dry-Needling  Treatment instructions: Expect mild to moderate muscle soreness. S/S of pneumothorax if dry needled over a lung field, and to seek immediate medical attention should they occur. Patient verbalized understanding of these instructions and education.  Patient Consent Given: Yes Education handout provided: Previously provided Muscles treated: suboccipitals; cervical paraspinals; scaleni Electrical stimulation performed: No Parameters: N/A Treatment response/outcome: decreased palpable tightness   Neuromuscular re-ed: Education re- importance of upright  posture with improved strength in posterior shoulder girdle musculature  Modalities: Uses heat/ice at home. Does not like TENS  Self Care: Use of noodle for sitting  Use of pillows for electronics when sitting  [facility] Adult PT Treatment:                                                DATE: 08/10/22 Therapeutic Exercise: Supine  Chin tuck 10 sec x 10  Standing  Chin tuck with noodle 5 sec x 5 Scap squeeze with noodle 10 sec x 10  Doorway stretch 3 positions 30 sec x 2  Manual Therapy: STM through ant/lat/post cervical musculature pt supine  Neuromuscular re-ed: Education re- importance of upright posture with improved strength in posterior shoulder girdle musculature  Modalities: Uses heat/ice at home. Does not like TENS  Self Care: Use of noodle for sitting  Use of pillows for electronics when sitting    PATIENT EDUCATION:  Education details: POC; HEP Person educated: Patient Education method: Programmer, multimedia, Demonstration, Tactile cues, Verbal cues, and Handouts Education comprehension: verbalized understanding, returned demonstration, verbal cues required, tactile cues required, and needs further education  HOME EXERCISE PROGRAM: Access Code: [email or web address] URL: [email or web address] Date: 08/10/2022 Prepared by: Corlis Leak  Exercises - Supine Cervical Retraction with Towel  - 2 x daily - 7 x weekly - 1 sets - 5-10 reps - 10 sec  hold - Seated Cervical Retraction  - 2 x daily - 7 x weekly - 1-2 sets - 5-10 reps - 10 sec  hold - Seated Scapular Retraction  - 2 x daily - 7 x weekly - 1-2 sets - 10 reps - 10 sec  hold - Doorway Pec Stretch at 60 Degrees Abduction  - 3 x daily - 7 x weekly - 1 sets - 3 reps - Doorway Pec Stretch at 90 Degrees Abduction  - 3 x daily - 7 x weekly - 1 sets - 3 reps - 30 seconds  hold - Doorway Pec Stretch at 120 Degrees Abduction  - 3 x daily - 7 x weekly - 1 sets - 3 reps - 30 second hold  hold  Patient Education - Trigger Point Dry  Needling  ASSESSMENT:  CLINICAL IMPRESSION: Persistent tightness through bilat cervical and occipital musculature.  Reviewed exercises and added trial of DN and manual work. Encouraged work on posture and alignment and exercises.   EVAL: Patient is a 77 y.o. female who was seen today for physical therapy evaluation and treatment for cervicalgia. She has a history of ACDF C5/6 1996 and intermittent cervical pain and dysfunction since that time. Patient reports irritation of cervical symptoms with yard work ~ 4 months ago. She has muscular tightness and pain with movement. Symptoms are worse first thing in the morning and late day. She has some headaches related to tightness in the neck. Patient presents with poor posture and alignment; limited cervical and thoracic/shoulder ROM/mobility; muscular tightness to palpation; postural weakness.  OBJECTIVE IMPAIRMENTS:  ACDF C5/6 1996 and intermittent cervical pain and dysfunction since that time. Patient reports irritation of cervical symptoms with yard work ~ 4 months ago. She has muscular tightness and pain with movement. Symptoms are worse first thing in the morning and late day. She has some headaches related to tightness in the neck. Patient presents with poor posture and alignment; limited cervical and thoracic/shoulder ROM/mobility; muscular tightness to palpation; postural weaknessdecreased activity tolerance, decreased mobility, decreased ROM, decreased strength, increased fascial restrictions, increased muscle spasms, impaired flexibility, improper body mechanics, postural dysfunction, and pain.    GOALS: Goals reviewed with patient? Yes  SHORT TERM GOALS: Target date: 09/21/2022  Independent in initial HEP  Baseline:  Goal status: INITIAL  2.  75% decrease in frequency and intensity of headaches  Baseline:  Goal status: INITIAL   LONG TERM GOALS: Target date: 11/02/2022   Decrease cervical pain by 75-100% allowing patient to return to  all normal functional activities  Baseline:  Goal status: INITIAL  2.  Increase cervical ROM to WFL's with no pain  Baseline:  Goal status: INITIAL  3.  Improve posture and alignment with patient to demonstrate improved upright posture with posterior shoulder girdle engaged  Baseline:  Goal status: INITIAL  4.  5/5 strength posterior shoulder girdle in middle and lower traps  Baseline:  Goal status: INITIAL  5.  Independent in HEP  Baseline:  Goal status: INITIAL  6.  Improve functional limitation score to 56 Baseline: 44 Goal status: INITIAL   PLAN:  PT FREQUENCY: 2x/week  PT DURATION: 12 weeks  PLANNED INTERVENTIONS: Therapeutic exercises, Therapeutic activity, Neuromuscular re-education, Balance training, Gait training, Patient/Family education, Self Care, Joint mobilization, Aquatic Therapy, Dry Needling, Electrical stimulation, Spinal mobilization, Cryotherapy, Moist heat, Taping, Ultrasound, Ionotophoresis 4mg /ml Dexamethasone, Manual therapy, and Re-evaluation  PLAN FOR NEXT SESSION: review and progress exercises program; continue with spine care and body mechanics education; manual work, DN, modalities as indicated    [employer], PT 08/22/2022, 1:11 PM

## 2022-08-23 ENCOUNTER — Encounter: Payer: Self-pay | Admitting: Cardiovascular Disease

## 2022-08-24 ENCOUNTER — Ambulatory Visit: Payer: Medicare Other

## 2022-08-28 ENCOUNTER — Ambulatory Visit: Payer: Medicare Other | Attending: Neurological Surgery | Admitting: Rehabilitative and Restorative Service Providers"

## 2022-08-28 ENCOUNTER — Encounter: Payer: Self-pay | Admitting: Rehabilitative and Restorative Service Providers"

## 2022-08-28 ENCOUNTER — Ambulatory Visit (INDEPENDENT_AMBULATORY_CARE_PROVIDER_SITE_OTHER): Payer: Medicare Other

## 2022-08-28 VITALS — BP 132/60 | HR 63 | Temp 97.3°F | Resp 16 | Ht 60.0 in | Wt 146.4 lb

## 2022-08-28 DIAGNOSIS — I6523 Occlusion and stenosis of bilateral carotid arteries: Secondary | ICD-10-CM

## 2022-08-28 DIAGNOSIS — M542 Cervicalgia: Secondary | ICD-10-CM | POA: Diagnosis not present

## 2022-08-28 DIAGNOSIS — R293 Abnormal posture: Secondary | ICD-10-CM | POA: Diagnosis not present

## 2022-08-28 DIAGNOSIS — M6281 Muscle weakness (generalized): Secondary | ICD-10-CM

## 2022-08-28 DIAGNOSIS — E782 Mixed hyperlipidemia: Secondary | ICD-10-CM | POA: Diagnosis not present

## 2022-08-28 DIAGNOSIS — R29898 Other symptoms and signs involving the musculoskeletal system: Secondary | ICD-10-CM | POA: Diagnosis not present

## 2022-08-28 MED ORDER — INCLISIRAN SODIUM 284 MG/1.5ML ~~LOC~~ SOSY
284.0000 mg | PREFILLED_SYRINGE | Freq: Once | SUBCUTANEOUS | Status: AC
  Administered 2022-08-28: 284 mg via SUBCUTANEOUS
  Filled 2022-08-28: qty 1.5

## 2022-08-28 NOTE — Therapy (Signed)
OUTPATIENT PHYSICAL THERAPY CERVICAL TREATMENT   Patient Name: Wendy [name] MRN: [id number] DOB:07/19/1945, 77 y.o., female Today's Date: 08/28/2022  END OF SESSION:  PT End of Session - 08/28/22 0927     Visit Number 3    Number of Visits 24    Date for PT Re-Evaluation 11/02/22    Authorization Type medicare + supplemental    Progress Note Due on Visit 10    PT Start Time 0927    PT Stop Time 1015    PT Time Calculation (min) 48 min    Activity Tolerance Patient tolerated treatment well             Past Medical History:  Diagnosis Date   Anxiety    Atypical chest pain    B12 deficiency    Cervical spondylosis without myelopathy    Diverticulosis of colon    Dysesthesia    GERD (gastroesophageal reflux disease)    Hypercholesteremia    Hypertension    IBS (irritable bowel syndrome)    Malignant melanoma (HCC)    Vitreous hemorrhage (HCC)    Past Surgical History:  Procedure Laterality Date   anterior cervical discectomy and fusion  1996   Dr. 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   Chronic cystitis 01/18/2022   Degeneration of lumbar intervertebral disc 01/18/2022   Lumbar spondylosis 01/18/2022   Lumbar radiculopathy 01/18/2022   Constipation 01/18/2022   Myalgia due to statin 01/18/2022   Aortic atherosclerosis (HCC) 01/18/2022   Hyponatremia 04/30/2021   Weakness 04/30/2021   COVID-19 virus infection 04/30/2021   Pain in wrist 01/16/2019   Neck pain 01/10/2019   Impacted cerumen of both ears 08/07/2018   Eustachian tube  dysfunction, bilateral 08/07/2018   Mixed hyperlipidemia 03/30/2016   Vaginitis, atrophic 05/26/2015   Carotid artery disease (HCC) 02/15/2015   Voiding dysfunction 12/11/2014   Third degree hemorrhoids 10/13/2014   Procidentia of uterus 09/23/2014   Occlusion and stenosis of carotid artery without mention of cerebral infarction 09/02/2013   Bilateral dry eyes 07/14/2012   Actinic keratoses 05/25/2012   Nuclear cataract, nonsenile 12/25/2011   Other seborrheic keratosis 09/18/2011   History of malignant melanoma of skin 05/11/2011   OBSTRUCTIVE SLEEP APNEA 11/19/2009   SNORING 10/11/2009   Abnormal respiratory rate 10/11/2009   LACERATION OF FINGER 09/03/2009   DYSESTHESIA 05/23/2008   MALIGNANT MELANOMA 01/13/2008   ANXIETY 01/13/2008   VITREOUS HEMORRHAGE 01/13/2008   HTN (hypertension) 01/13/2008   DIVERTICULOSIS OF COLON 01/13/2008   IRRITABLE BOWEL SYNDROME 01/13/2008   CERVICAL SPONDYLOSIS WITHOUT MYELOPATHY 01/13/2008   CHEST PAIN, ATYPICAL 01/13/2008   HYPERCHOLESTEROLEMIA 01/10/2008   GERD 01/10/2008    PCP: Dr Adrian Prince  REFERRING PROVIDER: Dr Barnett Abu   REFERRING DIAG: Cervicalgia  THERAPY DIAG:  Cervicalgia  Other symptoms and signs involving the musculoskeletal system  Muscle weakness (generalized)  Abnormal posture  Rationale for Evaluation and Treatment: Rehabilitation  ONSET DATE: 04/13/22  SUBJECTIVE:  SUBJECTIVE STATEMENT: Patient reports that she felt better after dry needling and it lasted a couple of days. She has been working on the chin tuck throughout the day. The neck is tight and it is worse late in the day and at night.       EVAL: increase in neck pain following yard work ~ 3-4 months ago. She is better with Advil but it is worse in  the evening. She has some degenerative changes in the cervical spine.  Hand dominance: Right  PERTINENT HISTORY:  HTN; cervical sx with fusion 1996; arthritis; melanoma   PAIN:  Are you having pain? Yes: NPRS scale: 6/10; gets up to 7-8/10 Pain location: posterior cervical spine  Pain description: tightness; nagging; into head; stiffness  Aggravating factors: worse first thing in the morning and toward the end of the day; activities  Relieving factors: OTC antiinflammatory; moving   PRECAUTIONS: None  WEIGHT BEARING RESTRICTIONS: No  FALLS:  Has patient fallen in last 6 months? 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 Cervical spondylosis, slightly progressed at the C3-4 and C4-5 levels. 2. Mild-moderate canal stenosis at C4-5 with moderate right foraminal stenosis. 3. Mild-moderate right foraminal stenosis at C3-4. 4. Prior C5-C7 ACDF with solid arthrodesis.  PATIENT SURVEYS:  FOTO not completed  SENSATION: Intermittent tingling in Rt hand   POSTURE: Patient presents with head forward posture with increased thoracic kyphosis; shoulders rounded and elevated; scapulae abducted and rotated along the thoracic spine; head of the humerus anterior in orientation.   PALPATION: Muscular tightness in ant/lat/post cervical musculature; pecs; upper trap; leveator   CERVICAL ROM:   Active ROM A/PROM (deg) eval  Flexion 41  Extension 40 tight; pain  Right lateral flexion 25 tight   Left lateral flexion 26 tight  Right rotation 51 tight  Left rotation 27 tight    (Blank rows = not tested)  UPPER EXTREMITY STRENGTH: no pain with resistive testing   Active ROM  Right eval Left eval  Shoulder flexion 5 5  Shoulder extension 5 5  Shoulder abduction 5 5  Shoulder adduction    Shoulder extension    Shoulder internal rotation    Shoulder external rotation    Middle trap  4 4  Lower trap  4 4  Wrist flexion    Wrist extension    Wrist ulnar deviation    Wrist radial deviation    Wrist pronation    Wrist supination     (Blank rows = not tested)  UPPER EXTREMITY ROM: end range tightness as noted   MMT Right eval Left eval  Shoulder flexion Tight  Tight   Shoulder extension    Shoulder abduction Tight  Tight   Shoulder adduction    Shoulder extension    Shoulder internal rotation Tight  Tight   Shoulder external rotation    Middle trapezius    Lower trapezius    Elbow flexion    Elbow extension    Wrist flexion    Wrist extension    Wrist ulnar deviation    Wrist radial deviation    Wrist pronation    Wrist supination    Grip strength     (  Blank rows = not tested)  CERVICAL SPECIAL TESTS:  Upper limb tension test (ULTT): Positive - tight R > L    [facility] Adult PT Treatment:                                                DATE: 08/28/22 Therapeutic Exercise: Supine  Chin tuck 10 sec x 10  Standing  Chin tuck with noodle 5 sec x 5 Scap squeeze with noodle 10 sec x 10  Doorway stretch 3 positions 30 sec x 2  L's w/noodle along spine yellow TB 3 sec x 10  W's w/noodle along spine yellow TB 3 sec x 10  Manual Therapy: STM through ant/lat/post cervical musculature pt supine  Skilled palpation to assess response to manual work and DN Trigger Point Dry-Needling  Treatment instructions: Expect mild to moderate muscle soreness. S/S of pneumothorax if dry needled over a lung field, and to seek immediate medical attention should they occur. Patient verbalized understanding of these instructions and education.  Patient Consent Given: Yes Education handout provided: Previously provided Muscles treated: suboccipitals; cervical  paraspinals; scaleni Electrical stimulation performed: No Parameters: N/A Treatment response/outcome: decreased palpable tightness   Neuromuscular re-ed: Education re- importance of upright posture with improved strength in posterior shoulder girdle musculature  Modalities: Uses heat/ice at home. Does not like TENS  Self Care: Use of noodle for sitting  Use of pillows for electronics when sitting  [facility] Adult PT Treatment:                                                DATE: 08/22/22 Therapeutic Exercise: Supine  Chin tuck 10 sec x 10  Standing  Chin tuck with noodle 5 sec x 5 Scap squeeze with noodle 10 sec x 10  Doorway stretch 3 positions 30 sec x 2  Manual Therapy: STM through ant/lat/post cervical musculature pt supine  Skilled palpation to assess response to manual work and DN Trigger Point Dry-Needling  Treatment instructions: Expect mild to moderate muscle soreness. S/S of pneumothorax if dry needled over a lung field, and to seek immediate medical attention should they occur. Patient verbalized understanding of these instructions and education.  Patient Consent Given: Yes Education handout provided: Previously provided Muscles treated: suboccipitals; cervical paraspinals; scaleni Electrical stimulation performed: No Parameters: N/A Treatment response/outcome: decreased palpable tightness   Neuromuscular re-ed: Education re- importance of upright posture with improved strength in posterior shoulder girdle musculature  Modalities: Uses heat/ice at home. Does not like TENS  Self Care: Use of noodle for sitting  Use of pillows for electronics when sitting   PATIENT EDUCATION:  Education details: POC; HEP Person educated: Patient Education method: Programmer, multimedia, Demonstration, Tactile cues, Verbal cues, and Handouts Education comprehension: verbalized understanding, returned demonstration, verbal cues required, tactile cues required, and needs further education  HOME  EXERCISE PROGRAM: Access Code: [email or web address] URL: [email or web address] Date: 08/28/2022 Prepared by: Corlis Leak  Exercises - Supine Cervical Retraction with Towel  - 2 x daily - 7 x weekly - 1 sets - 5-10 reps - 10 sec  hold - Seated Cervical Retraction  - 2 x daily - 7 x weekly - 1-2 sets - 5-10 reps - 10 sec  hold - Seated  Scapular Retraction  - 2 x daily - 7 x weekly - 1-2 sets - 10 reps - 10 sec  hold - Doorway Pec Stretch at 60 Degrees Abduction  - 3 x daily - 7 x weekly - 1 sets - 3 reps - Doorway Pec Stretch at 90 Degrees Abduction  - 3 x daily - 7 x weekly - 1 sets - 3 reps - 30 seconds  hold - Doorway Pec Stretch at 120 Degrees Abduction  - 3 x daily - 7 x weekly - 1 sets - 3 reps - 30 second hold  hold - Shoulder External Rotation and Scapular Retraction with Resistance  - 2 x daily - 7 x weekly - 1 sets - 10 reps - 3-5 sec  hold - Shoulder W - External Rotation with Resistance  - 2 x daily - 7 x weekly - 1-2 sets - 10 reps - 3 sec  hold  Patient Education - Trigger Point Dry Needling  ASSESSMENT:  CLINICAL IMPRESSION: Persistent tightness through bilat cervical and occipital musculature. Good response to DN and manual work. Added resistive exercises for posterior shoulder girdle strengthening. Encouraged work on posture and alignment and exercises.   EVAL: Patient is a 77 y.o. female who was seen today for physical therapy evaluation and treatment for cervicalgia. She has a history of ACDF C5/6 1996 and intermittent cervical pain and dysfunction since that time. Patient reports irritation of cervical symptoms with yard work ~ 4 months ago. She has muscular tightness and pain with movement. Symptoms are worse first thing in the morning and late day. She has some headaches related to tightness in the neck. Patient presents with poor posture and alignment; limited cervical and thoracic/shoulder ROM/mobility; muscular tightness to palpation; postural weakness.  OBJECTIVE  IMPAIRMENTS:  ACDF C5/6 1996 and intermittent cervical pain and dysfunction since that time. Patient reports irritation of cervical symptoms with yard work ~ 4 months ago. She has muscular tightness and pain with movement. Symptoms are worse first thing in the morning and late day. She has some headaches related to tightness in the neck. Patient presents with poor posture and alignment; limited cervical and thoracic/shoulder ROM/mobility; muscular tightness to palpation; postural weaknessdecreased activity tolerance, decreased mobility, decreased ROM, decreased strength, increased fascial restrictions, increased muscle spasms, impaired flexibility, improper body mechanics, postural dysfunction, and pain.    GOALS: Goals reviewed with patient? Yes  SHORT TERM GOALS: Target date: 09/21/2022  Independent in initial HEP  Baseline:  Goal status: INITIAL  2.  75% decrease in frequency and intensity of headaches  Baseline:  Goal status: INITIAL   LONG TERM GOALS: Target date: 11/02/2022   Decrease cervical pain by 75-100% allowing patient to return to all normal functional activities  Baseline:  Goal status: INITIAL  2.  Increase cervical ROM to WFL's with no pain  Baseline:  Goal status: INITIAL  3.  Improve posture and alignment with patient to demonstrate improved upright posture with posterior shoulder girdle engaged  Baseline:  Goal status: INITIAL  4.  5/5 strength posterior shoulder girdle in middle and lower traps  Baseline:  Goal status: INITIAL  5.  Independent in HEP  Baseline:  Goal status: INITIAL  6.  Improve functional limitation score to 56 Baseline: 44 Goal status: INITIAL   PLAN:  PT FREQUENCY: 2x/week  PT DURATION: 12 weeks  PLANNED INTERVENTIONS: Therapeutic exercises, Therapeutic activity, Neuromuscular re-education, Balance training, Gait training, Patient/Family education, Self Care, Joint mobilization, Aquatic Therapy, Dry Needling, Electrical  stimulation,  Spinal mobilization, Cryotherapy, Moist heat, Taping, Ultrasound, Ionotophoresis 4mg /ml Dexamethasone, Manual therapy, and Re-evaluation  PLAN FOR NEXT SESSION: review and progress exercises program; continue with spine care and body mechanics education; manual work, DN, modalities as indicated    [employer], PT 08/28/2022, 9:27 AM

## 2022-08-28 NOTE — Progress Notes (Signed)
Diagnosis: Hyperlipidemia  Provider:  Mannam, Praveen MD  Procedure: Injection  Leqvio (inclisiran), Dose: 284 mg, Site: subcutaneous, Number of injections: 1  Post Care:  n/a  Discharge: Condition: Good, Destination: Home . AVS Provided  Performed by:  Joleena Weisenburger E Soham Hollett, LPN       

## 2022-08-30 ENCOUNTER — Telehealth: Payer: Self-pay | Admitting: Pharmacist Clinician (PhC)/ Clinical Pharmacy Specialist

## 2022-08-30 ENCOUNTER — Other Ambulatory Visit: Payer: Self-pay | Admitting: Pharmacist Clinician (PhC)/ Clinical Pharmacy Specialist

## 2022-08-30 ENCOUNTER — Ambulatory Visit: Payer: Medicare Other | Admitting: Rehabilitative and Restorative Service Providers"

## 2022-08-30 ENCOUNTER — Encounter: Payer: Self-pay | Admitting: Rehabilitative and Restorative Service Providers"

## 2022-08-30 DIAGNOSIS — E7800 Pure hypercholesterolemia, unspecified: Secondary | ICD-10-CM

## 2022-08-30 DIAGNOSIS — M6281 Muscle weakness (generalized): Secondary | ICD-10-CM

## 2022-08-30 DIAGNOSIS — R29898 Other symptoms and signs involving the musculoskeletal system: Secondary | ICD-10-CM

## 2022-08-30 DIAGNOSIS — M542 Cervicalgia: Secondary | ICD-10-CM

## 2022-08-30 DIAGNOSIS — R293 Abnormal posture: Secondary | ICD-10-CM

## 2022-08-30 NOTE — Telephone Encounter (Signed)
Labs - [employer]

## 2022-08-30 NOTE — Therapy (Signed)
OUTPATIENT PHYSICAL THERAPY CERVICAL TREATMENT   Patient Name: Wendy [name] MRN: [id number] DOB:Jul 18, 1945, 77 y.o., female Today's Date: 08/30/2022  END OF SESSION:  PT End of Session - 08/30/22 1019     Visit Number 4    Number of Visits 24    Date for PT Re-Evaluation 11/02/22    Authorization Type medicare + supplemental    Progress Note Due on Visit 10    PT Start Time 1015    PT Stop Time 1104    PT Time Calculation (min) 49 min    Activity Tolerance Patient tolerated treatment well             Past Medical History:  Diagnosis Date   Anxiety    Atypical chest pain    B12 deficiency    Cervical spondylosis without myelopathy    Diverticulosis of colon    Dysesthesia    GERD (gastroesophageal reflux disease)    Hypercholesteremia    Hypertension    IBS (irritable bowel syndrome)    Malignant melanoma (HCC)    Vitreous hemorrhage (HCC)    Past Surgical History:  Procedure Laterality Date   anterior cervical discectomy and fusion  1996   Dr. 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   Chronic cystitis 01/18/2022   Degeneration of lumbar intervertebral disc 01/18/2022   Lumbar spondylosis 01/18/2022   Lumbar radiculopathy 01/18/2022   Constipation 01/18/2022   Myalgia due to statin 01/18/2022   Aortic atherosclerosis (HCC) 01/18/2022   Hyponatremia 04/30/2021   Weakness 04/30/2021   COVID-19 virus infection 04/30/2021   Pain in wrist 01/16/2019   Neck pain 01/10/2019   Impacted cerumen of both ears 08/07/2018   Eustachian tube  dysfunction, bilateral 08/07/2018   Mixed hyperlipidemia 03/30/2016   Vaginitis, atrophic 05/26/2015   Carotid artery disease (HCC) 02/15/2015   Voiding dysfunction 12/11/2014   Third degree hemorrhoids 10/13/2014   Procidentia of uterus 09/23/2014   Occlusion and stenosis of carotid artery without mention of cerebral infarction 09/02/2013   Bilateral dry eyes 07/14/2012   Actinic keratoses 05/25/2012   Nuclear cataract, nonsenile 12/25/2011   Other seborrheic keratosis 09/18/2011   History of malignant melanoma of skin 05/11/2011   OBSTRUCTIVE SLEEP APNEA 11/19/2009   SNORING 10/11/2009   Abnormal respiratory rate 10/11/2009   LACERATION OF FINGER 09/03/2009   DYSESTHESIA 05/23/2008   MALIGNANT MELANOMA 01/13/2008   ANXIETY 01/13/2008   VITREOUS HEMORRHAGE 01/13/2008   HTN (hypertension) 01/13/2008   DIVERTICULOSIS OF COLON 01/13/2008   IRRITABLE BOWEL SYNDROME 01/13/2008   CERVICAL SPONDYLOSIS WITHOUT MYELOPATHY 01/13/2008   CHEST PAIN, ATYPICAL 01/13/2008   HYPERCHOLESTEROLEMIA 01/10/2008   GERD 01/10/2008    PCP: Dr Adrian Prince  REFERRING PROVIDER: Dr Barnett Abu   REFERRING DIAG: Cervicalgia  THERAPY DIAG:  Cervicalgia  Other symptoms and signs involving the musculoskeletal system  Muscle weakness (generalized)  Abnormal posture  Rationale for Evaluation and Treatment: Rehabilitation  ONSET DATE: 04/13/22  SUBJECTIVE:  SUBJECTIVE STATEMENT: Patient reports that she was really sore after the dry needling and it seemed tighter on the L. She has been working on the chin tuck throughout the day. The neck is tight and it is worse late in the day and at night.       EVAL: increase in neck pain following yard work ~ 3-4 months ago. She is better with Advil but it is  worse in the evening. She has some degenerative changes in the cervical spine.  Hand dominance: Right  PERTINENT HISTORY:  HTN; cervical sx with fusion 1996; arthritis; melanoma   PAIN:  Are you having pain? Yes: NPRS scale: 7/10; gets up to 7-8/10 Pain location: posterior cervical spine  Pain description: tightness; nagging; into head; stiffness  Aggravating factors: worse first thing in the morning and toward the end of the day; activities  Relieving factors: OTC antiinflammatory; moving   PRECAUTIONS: None  WEIGHT BEARING RESTRICTIONS: No  FALLS:  Has patient fallen in last 6 months? 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 Cervical spondylosis, slightly progressed at the C3-4 and C4-5 levels. 2. Mild-moderate canal stenosis at C4-5 with moderate right foraminal stenosis. 3. Mild-moderate right foraminal stenosis at C3-4. 4. Prior C5-C7 ACDF with solid arthrodesis.  PATIENT SURVEYS:  FOTO not completed  SENSATION: Intermittent tingling in Rt hand   POSTURE: Patient presents with head forward posture with increased thoracic kyphosis; shoulders rounded and elevated; scapulae abducted and rotated along the thoracic spine; head of the humerus anterior in orientation.   PALPATION: Muscular tightness in ant/lat/post cervical musculature; pecs; upper trap; leveator   CERVICAL ROM:   Active ROM A/PROM (deg) eval  Flexion 41  Extension 40 tight; pain  Right lateral flexion 25 tight   Left lateral flexion 26 tight  Right rotation 51 tight  Left rotation 27 tight    (Blank rows = not tested)  UPPER EXTREMITY STRENGTH: no pain with resistive testing   Active  ROM Right eval Left eval  Shoulder flexion 5 5  Shoulder extension 5 5  Shoulder abduction 5 5  Shoulder adduction    Shoulder extension    Shoulder internal rotation    Shoulder external rotation    Middle trap  4 4  Lower trap  4 4  Wrist flexion    Wrist extension    Wrist ulnar deviation    Wrist radial deviation    Wrist pronation    Wrist supination     (Blank rows = not tested)  UPPER EXTREMITY ROM: end range tightness as noted   MMT Right eval Left eval  Shoulder flexion Tight  Tight   Shoulder extension    Shoulder abduction Tight  Tight   Shoulder adduction    Shoulder extension    Shoulder internal rotation Tight  Tight   Shoulder external rotation    Middle trapezius    Lower trapezius    Elbow flexion    Elbow extension    Wrist flexion    Wrist extension    Wrist ulnar deviation    Wrist radial deviation    Wrist pronation    Wrist supination    Grip  strength     (Blank rows = not tested)  CERVICAL SPECIAL TESTS:  Upper limb tension test (ULTT): Positive - tight R > L    [facility] Adult PT Treatment:                                                DATE: 08/30/22 Therapeutic Exercise: Supine  Chin tuck 10 sec x 10  Standing  Chin tuck with noodle 5 sec x 5 Scap squeeze with noodle 10 sec x 10  Doorway stretch 3 positions 30 sec x 2  L's w/noodle along spine yellow TB 3 sec x 10  W's w/noodle along spine yellow TB 3 sec x 10  Row red TB 3 sec x 10  Shoulder extension red TB 3 sec x 10  Manual Therapy: STM through ant/lat/post cervical musculature pt supine  Joint mobs/gene stretching  Skilled palpation to assess response to manual work   Neuromuscular re-ed: Education re- importance of upright posture with improved strength in posterior shoulder girdle musculature  Modalities: Moist heat x 10 min  Does not like TENS  Self Care: Use of noodle for sitting  Use of pillows for electronics when sitting  [facility] Adult PT Treatment:                                                 DATE: 08/28/22 Therapeutic Exercise: Supine  Chin tuck 10 sec x 10  Standing  Chin tuck with noodle 5 sec x 5 Scap squeeze with noodle 10 sec x 10  Doorway stretch 3 positions 30 sec x 2  L's w/noodle along spine yellow TB 3 sec x 10  W's w/noodle along spine yellow TB 3 sec x 10  Manual Therapy: STM through ant/lat/post cervical musculature pt supine  Skilled palpation to assess response to manual work and DN Trigger Point Dry-Needling  Treatment instructions: Expect mild to moderate muscle soreness. S/S of pneumothorax if dry needled over a lung field, and to seek immediate medical attention should they occur. Patient verbalized understanding of these instructions and education.  Patient Consent Given: Yes Education handout provided: Previously provided Muscles treated: suboccipitals; cervical paraspinals; scaleni Electrical stimulation performed: No Parameters: N/A Treatment response/outcome: decreased palpable tightness   Neuromuscular re-ed: Education re- importance of upright posture with improved strength in posterior shoulder girdle musculature  Modalities: Uses heat/ice at home. Does not like TENS  Self Care: Use of noodle for sitting  Use of pillows for electronics when sitting   PATIENT EDUCATION:  Education details: POC; HEP Person educated: Patient Education method: Programmer, multimedia, Demonstration, Tactile cues, Verbal cues, and Handouts Education comprehension: verbalized understanding, returned demonstration, verbal cues required, tactile cues required, and needs further education  HOME EXERCISE PROGRAM: Access Code: [email or web address] URL: [email or web address] Date: 08/30/2022 Prepared by: Corlis Leak  Exercises - Supine Cervical Retraction with Towel  - 2 x daily - 7 x weekly - 1 sets - 5-10 reps - 10 sec  hold - Seated Cervical Retraction  - 2 x daily - 7 x weekly - 1-2 sets - 5-10 reps - 10 sec  hold - Seated Scapular  Retraction  - 2 x daily - 7 x weekly - 1-2 sets - 10 reps -  10 sec  hold - Doorway Pec Stretch at 60 Degrees Abduction  - 3 x daily - 7 x weekly - 1 sets - 3 reps - Doorway Pec Stretch at 90 Degrees Abduction  - 3 x daily - 7 x weekly - 1 sets - 3 reps - 30 seconds  hold - Doorway Pec Stretch at 120 Degrees Abduction  - 3 x daily - 7 x weekly - 1 sets - 3 reps - 30 second hold  hold - Shoulder External Rotation and Scapular Retraction with Resistance  - 2 x daily - 7 x weekly - 1 sets - 10 reps - 3-5 sec  hold - Shoulder W - External Rotation with Resistance  - 2 x daily - 7 x weekly - 1-2 sets - 10 reps - 3 sec  hold - Standing Bilateral Low Shoulder Row with Anchored Resistance  - 2 x daily - 7 x weekly - 1-3 sets - 10 reps - 2-3 sec  hold - Shoulder extension with resistance - Neutral  - 1 x daily - 7 x weekly - 1-2 sets - 10 reps - 3-5 sec  hold  Patient Education - Trigger Point Dry Needling  ASSESSMENT:  CLINICAL IMPRESSION: Persistent pain and tightness through bilat cervical and occipital musculature L > R. Reviewed resistive exercises for posterior shoulder girdle strengthening. Encouraged work on posture and alignment and exercises.   EVAL: Patient is a 77 y.o. female who was seen today for physical therapy evaluation and treatment for cervicalgia. She has a history of ACDF C5/6 1996 and intermittent cervical pain and dysfunction since that time. Patient reports irritation of cervical symptoms with yard work ~ 4 months ago. She has muscular tightness and pain with movement. Symptoms are worse first thing in the morning and late day. She has some headaches related to tightness in the neck. Patient presents with poor posture and alignment; limited cervical and thoracic/shoulder ROM/mobility; muscular tightness to palpation; postural weakness.  OBJECTIVE IMPAIRMENTS:  ACDF C5/6 1996 and intermittent cervical pain and dysfunction since that time. Patient reports irritation of cervical  symptoms with yard work ~ 4 months ago. She has muscular tightness and pain with movement. Symptoms are worse first thing in the morning and late day. She has some headaches related to tightness in the neck. Patient presents with poor posture and alignment; limited cervical and thoracic/shoulder ROM/mobility; muscular tightness to palpation; postural weaknessdecreased activity tolerance, decreased mobility, decreased ROM, decreased strength, increased fascial restrictions, increased muscle spasms, impaired flexibility, improper body mechanics, postural dysfunction, and pain.    GOALS: Goals reviewed with patient? Yes  SHORT TERM GOALS: Target date: 09/21/2022  Independent in initial HEP  Baseline:  Goal status: INITIAL  2.  75% decrease in frequency and intensity of headaches  Baseline:  Goal status: INITIAL   LONG TERM GOALS: Target date: 11/02/2022   Decrease cervical pain by 75-100% allowing patient to return to all normal functional activities  Baseline:  Goal status: INITIAL  2.  Increase cervical ROM to WFL's with no pain  Baseline:  Goal status: INITIAL  3.  Improve posture and alignment with patient to demonstrate improved upright posture with posterior shoulder girdle engaged  Baseline:  Goal status: INITIAL  4.  5/5 strength posterior shoulder girdle in middle and lower traps  Baseline:  Goal status: INITIAL  5.  Independent in HEP  Baseline:  Goal status: INITIAL  6.  Improve functional limitation score to 56 Baseline: 44 Goal status: INITIAL  PLAN:  PT FREQUENCY: 2x/week  PT DURATION: 12 weeks  PLANNED INTERVENTIONS: Therapeutic exercises, Therapeutic activity, Neuromuscular re-education, Balance training, Gait training, Patient/Family education, Self Care, Joint mobilization, Aquatic Therapy, Dry Needling, Electrical stimulation, Spinal mobilization, Cryotherapy, Moist heat, Taping, Ultrasound, Ionotophoresis 4mg /ml Dexamethasone, Manual therapy, and  Re-evaluation  PLAN FOR NEXT SESSION: review and progress exercises program; continue with spine care and body mechanics education; manual work, DN, modalities as indicated    [employer], PT 08/30/2022, 10:19 AM

## 2022-09-04 ENCOUNTER — Encounter: Payer: Self-pay | Admitting: Rehabilitative and Restorative Service Providers"

## 2022-09-04 ENCOUNTER — Ambulatory Visit: Payer: Medicare Other | Admitting: Rehabilitative and Restorative Service Providers"

## 2022-09-04 DIAGNOSIS — R29898 Other symptoms and signs involving the musculoskeletal system: Secondary | ICD-10-CM

## 2022-09-04 DIAGNOSIS — M6281 Muscle weakness (generalized): Secondary | ICD-10-CM | POA: Diagnosis not present

## 2022-09-04 DIAGNOSIS — R293 Abnormal posture: Secondary | ICD-10-CM

## 2022-09-04 DIAGNOSIS — M542 Cervicalgia: Secondary | ICD-10-CM

## 2022-09-04 NOTE — Therapy (Signed)
OUTPATIENT PHYSICAL THERAPY CERVICAL TREATMENT   Patient Name: Wendy [name] MRN: [id number] DOB:[birth year], 77 y.o., female Today's Date: 09/04/2022  END OF SESSION:  PT End of Session - 09/04/22 0924     Visit Number 5    Number of Visits 24    Date for PT Re-Evaluation 11/02/22    Authorization Type medicare + supplemental    Progress Note Due on Visit 10    PT Start Time 0924    PT Stop Time 1015    PT Time Calculation (min) 51 min    Activity Tolerance Patient tolerated treatment well             Past Medical History:  Diagnosis Date   Anxiety    Atypical chest pain    B12 deficiency    Cervical spondylosis without myelopathy    Diverticulosis of colon    Dysesthesia    GERD (gastroesophageal reflux disease)    Hypercholesteremia    Hypertension    IBS (irritable bowel syndrome)    Malignant melanoma (HCC)    Vitreous hemorrhage (HCC)    Past Surgical History:  Procedure Laterality Date   anterior cervical discectomy and fusion  1996   Dr. 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   Chronic cystitis 01/18/2022   Degeneration of lumbar intervertebral disc 01/18/2022   Lumbar spondylosis 01/18/2022   Lumbar radiculopathy 01/18/2022   Constipation 01/18/2022   Myalgia due to statin 01/18/2022   Aortic atherosclerosis (HCC) 01/18/2022   Hyponatremia 04/30/2021   Weakness 04/30/2021   COVID-19 virus infection 04/30/2021   Pain in wrist 01/16/2019   Neck pain 01/10/2019   Impacted cerumen of both ears 08/07/2018   Eustachian tube  dysfunction, bilateral 08/07/2018   Mixed hyperlipidemia 03/30/2016   Vaginitis, atrophic 05/26/2015   Carotid artery disease (HCC) 02/15/2015   Voiding dysfunction 12/11/2014   Third degree hemorrhoids 10/13/2014   Procidentia of uterus 09/23/2014   Occlusion and stenosis of carotid artery without mention of cerebral infarction 09/02/2013   Bilateral dry eyes 07/14/2012   Actinic keratoses 05/25/2012   Nuclear cataract, nonsenile 12/25/2011   Other seborrheic keratosis 09/18/2011   History of malignant melanoma of skin 05/11/2011   OBSTRUCTIVE SLEEP APNEA 11/19/2009   SNORING 10/11/2009   Abnormal respiratory rate 10/11/2009   LACERATION OF FINGER 09/03/2009   DYSESTHESIA 05/23/2008   MALIGNANT MELANOMA 01/13/2008   ANXIETY 01/13/2008   VITREOUS HEMORRHAGE 01/13/2008   HTN (hypertension) 01/13/2008   DIVERTICULOSIS OF COLON 01/13/2008   IRRITABLE BOWEL SYNDROME 01/13/2008   CERVICAL SPONDYLOSIS WITHOUT MYELOPATHY 01/13/2008   CHEST PAIN, ATYPICAL 01/13/2008   HYPERCHOLESTEROLEMIA 01/10/2008   GERD 01/10/2008    PCP: Dr Adrian Prince  REFERRING PROVIDER: Dr Barnett Abu   REFERRING DIAG: Cervicalgia  THERAPY DIAG:  Cervicalgia  Other symptoms and signs involving the musculoskeletal system  Muscle weakness (generalized)  Abnormal posture  Rationale for Evaluation and Treatment: Rehabilitation  ONSET DATE: 04/13/22  SUBJECTIVE:  SUBJECTIVE STATEMENT: Patient reports that she feels tightness in the neck and shoulders. She continues to feel tightness. She can sleep OK but has increased tightness in the morning when she wakes up. She has been working on the chin tuck, keeping her chin level throughout the day. Feels a tightness in the L side of her neck and that 'blocks her  head from turning to the L".    EVAL: increase in neck pain following yard work ~ 3-4 months ago. She is better with Advil but it is worse in the evening. She has some degenerative changes in the cervical spine.  Hand dominance: Right  PERTINENT HISTORY:  HTN; cervical sx with fusion 1996; arthritis; melanoma   PAIN:  Are you having pain? Yes: NPRS scale: 5-7/10; gets up to 7-8/10 Pain location: posterior cervical spine  Pain description: tightness; nagging; into head; stiffness  Aggravating factors: worse first thing in the morning and toward the end of the day; activities  Relieving factors: OTC antiinflammatory; moving   PRECAUTIONS: None  WEIGHT BEARING RESTRICTIONS: No  FALLS:  Has patient fallen in last 6 months? 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 Cervical spondylosis, slightly progressed at the C3-4 and C4-5 levels. 2. Mild-moderate canal stenosis at C4-5 with moderate right foraminal stenosis. 3. Mild-moderate right foraminal stenosis at C3-4. 4. Prior C5-C7 ACDF with solid arthrodesis.  PATIENT SURVEYS:  FOTO not completed  SENSATION: Intermittent tingling in Rt hand   POSTURE: Patient presents with head forward posture with increased thoracic kyphosis; shoulders rounded and elevated; scapulae abducted and rotated along the thoracic spine; head of the humerus anterior in orientation.   PALPATION: Muscular tightness in ant/lat/post cervical musculature; pecs; upper trap; leveator   CERVICAL ROM:   Active ROM A/PROM (deg) eval  Flexion 41  Extension 40 tight; pain  Right lateral flexion 25 tight   Left lateral flexion 26 tight  Right rotation  51 tight  Left rotation 27 tight    (Blank rows = not tested)  UPPER EXTREMITY STRENGTH: no pain with resistive testing   Active ROM Right eval Left eval  Shoulder flexion 5 5  Shoulder extension 5 5  Shoulder abduction 5 5  Shoulder adduction    Shoulder extension    Shoulder internal rotation    Shoulder external rotation    Middle trap  4 4  Lower trap  4 4  Wrist flexion    Wrist extension    Wrist ulnar deviation    Wrist radial deviation    Wrist pronation    Wrist supination     (Blank rows = not tested)  UPPER EXTREMITY ROM: end range tightness as noted   MMT Right eval Left eval  Shoulder flexion Tight  Tight   Shoulder extension    Shoulder abduction Tight  Tight   Shoulder adduction    Shoulder extension    Shoulder internal rotation Tight  Tight   Shoulder external rotation    Middle trapezius    Lower trapezius    Elbow flexion    Elbow extension    Wrist flexion    Wrist extension    Wrist ulnar deviation  Wrist radial deviation    Wrist pronation    Wrist supination    Grip strength     (Blank rows = not tested)  CERVICAL SPECIAL TESTS:  Upper limb tension test (ULTT): Positive - tight R > L    [facility] Adult PT Treatment:                                                DATE: 09/04/22 Therapeutic Exercise: UBE L2 x 4 min alt fwd and back  Supine  Chin tuck 10 sec x 10  Standing  Chin tuck with noodle 5 sec x 5 Doorway stretch 3 positions 30 sec x 2  L's w/noodle along spine yellow TB 3 sec x 10  W's w/noodle along spine yellow TB 3 sec x 10  Sitting  Relaxation series 5-10 reps each exercise  Manual Therapy: STM through ant/lat/post cervical musculature pt supine  Skilled palpation to assess response to manual work and DN Education re- importance of upright posture with improved strength in posterior shoulder girdle musculature  Modalities: Uses heat/ice at home. Does not like TENS  Self Care: Use of noodle for sitting  Use of  pillows for electronics when sitting  [facility] Adult PT Treatment:                                                DATE: 08/30/22 Therapeutic Exercise: Supine  Chin tuck 10 sec x 10  Standing  Chin tuck with noodle 5 sec x 5 Scap squeeze with noodle 10 sec x 10  Doorway stretch 3 positions 30 sec x 2  L's w/noodle along spine yellow TB 3 sec x 10  W's w/noodle along spine yellow TB 3 sec x 10  Row red TB 3 sec x 10  Shoulder extension red TB 3 sec x 10  Manual Therapy: STM through ant/lat/post cervical musculature pt supine  Joint mobs/gene stretching  Skilled palpation to assess response to manual work   Neuromuscular re-ed: Education re- importance of upright posture with improved strength in posterior shoulder girdle musculature  Modalities: Moist heat x 10 min  Does not like TENS  Self Care: Use of noodle for sitting  Use of pillows for electronics when sitting  [facility] Adult PT Treatment:    (dry needling)       DATE: 08/28/22 Therapeutic Exercise: Supine  Chin tuck 10 sec x 10  Standing  Doorway stretch 3 positions 30 sec x 2  L's w/noodle along spine yellow TB 3 sec x 10  W's w/noodle along spine yellow TB 3 sec x 10  L's w/noodle along spine yellow TB 3 sec x 10  W's w/noodle along spine yellow TB 3 sec x 10  Manual Therapy: STM through ant/lat/post cervical musculature pt supine  Skilled palpation to assess response to manual work and DN Trigger Point Dry-Needling  Treatment instructions: Expect mild to moderate muscle soreness. S/S of pneumothorax if dry needled over a lung field, and to seek immediate medical attention should they occur. Patient verbalized understanding of these instructions and education.  Patient Consent Given: Yes Education handout provided: Previously provided Muscles treated: suboccipitals; cervical paraspinals; scaleni Electrical stimulation performed: No Parameters: N/A Treatment response/outcome: decreased palpable  tightness   Neuromuscular  re-ed: Education re- importance of upright posture with improved strength in posterior shoulder girdle musculature  Modalities: Uses heat/ice at home. Does not like TENS  Self Care: Use of noodle for sitting  Use of pillows for electronics when sitting   PATIENT EDUCATION:  Education details: POC; HEP Person educated: Patient Education method: Programmer, multimedia, Demonstration, Tactile cues, Verbal cues, and Handouts Education comprehension: verbalized understanding, returned demonstration, verbal cues required, tactile cues required, and needs further education  HOME EXERCISE PROGRAM: Access Code: [email or web address] URL: [email or web address] Date: 08/30/2022 Prepared by: Corlis Leak  Exercises - Supine Cervical Retraction with Towel  - 2 x daily - 7 x weekly - 1 sets - 5-10 reps - 10 sec  hold - Seated Cervical Retraction  - 2 x daily - 7 x weekly - 1-2 sets - 5-10 reps - 10 sec  hold - Seated Scapular Retraction  - 2 x daily - 7 x weekly - 1-2 sets - 10 reps - 10 sec  hold - Doorway Pec Stretch at 60 Degrees Abduction  - 3 x daily - 7 x weekly - 1 sets - 3 reps - Doorway Pec Stretch at 90 Degrees Abduction  - 3 x daily - 7 x weekly - 1 sets - 3 reps - 30 seconds  hold - Doorway Pec Stretch at 120 Degrees Abduction  - 3 x daily - 7 x weekly - 1 sets - 3 reps - 30 second hold  hold - Shoulder External Rotation and Scapular Retraction with Resistance  - 2 x daily - 7 x weekly - 1 sets - 10 reps - 3-5 sec  hold - Shoulder W - External Rotation with Resistance  - 2 x daily - 7 x weekly - 1-2 sets - 10 reps - 3 sec  hold - Standing Bilateral Low Shoulder Row with Anchored Resistance  - 2 x daily - 7 x weekly - 1-3 sets - 10 reps - 2-3 sec  hold - Shoulder extension with resistance - Neutral  - 1 x daily - 7 x weekly - 1-2 sets - 10 reps - 3-5 sec  hold  Patient Education - Trigger Point Dry Needling  ASSESSMENT:  CLINICAL IMPRESSION: Some improvement but patient has persistent pain  and tightness through bilat cervical and occipital musculature L > R. Reviewed resistive exercises for posterior shoulder girdle strengthening. Encouraged work on posture and alignment and exercises.   EVAL: Patient is a 77 y.o. female who was seen today for physical therapy evaluation and treatment for cervicalgia. She has a history of ACDF C5/6 1996 and intermittent cervical pain and dysfunction since that time. Patient reports irritation of cervical symptoms with yard work ~ 4 months ago. She has muscular tightness and pain with movement. Symptoms are worse first thing in the morning and late day. She has some headaches related to tightness in the neck. Patient presents with poor posture and alignment; limited cervical and thoracic/shoulder ROM/mobility; muscular tightness to palpation; postural weakness.  OBJECTIVE IMPAIRMENTS:  ACDF C5/6 1996 and intermittent cervical pain and dysfunction since that time. Patient reports irritation of cervical symptoms with yard work ~ 4 months ago. She has muscular tightness and pain with movement. Symptoms are worse first thing in the morning and late day. She has some headaches related to tightness in the neck. Patient presents with poor posture and alignment; limited cervical and thoracic/shoulder ROM/mobility; muscular tightness to palpation; postural weaknessdecreased activity tolerance, decreased mobility, decreased ROM, decreased strength, increased fascial  restrictions, increased muscle spasms, impaired flexibility, improper body mechanics, postural dysfunction, and pain.    GOALS: Goals reviewed with patient? Yes  SHORT TERM GOALS: Target date: 09/21/2022  Independent in initial HEP  Baseline:  Goal status: INITIAL  2.  75% decrease in frequency and intensity of headaches  Baseline:  Goal status: INITIAL   LONG TERM GOALS: Target date: 11/02/2022   Decrease cervical pain by 75-100% allowing patient to return to all normal functional activities   Baseline:  Goal status: INITIAL  2.  Increase cervical ROM to WFL's with no pain  Baseline:  Goal status: INITIAL  3.  Improve posture and alignment with patient to demonstrate improved upright posture with posterior shoulder girdle engaged  Baseline:  Goal status: INITIAL  4.  5/5 strength posterior shoulder girdle in middle and lower traps  Baseline:  Goal status: INITIAL  5.  Independent in HEP  Baseline:  Goal status: INITIAL  6.  Improve functional limitation score to 56 Baseline: 44 Goal status: INITIAL   PLAN:  PT FREQUENCY: 2x/week  PT DURATION: 12 weeks  PLANNED INTERVENTIONS: Therapeutic exercises, Therapeutic activity, Neuromuscular re-education, Balance training, Gait training, Patient/Family education, Self Care, Joint mobilization, Aquatic Therapy, Dry Needling, Electrical stimulation, Spinal mobilization, Cryotherapy, Moist heat, Taping, Ultrasound, Ionotophoresis 4mg /ml Dexamethasone, Manual therapy, and Re-evaluation  PLAN FOR NEXT SESSION: review and progress exercises program; continue with spine care and body mechanics education; manual work, DN, modalities as indicated    [employer], PT 09/04/2022, 9:24 AM

## 2022-09-06 ENCOUNTER — Ambulatory Visit: Payer: Medicare Other | Admitting: Rehabilitative and Restorative Service Providers"

## 2022-09-06 ENCOUNTER — Encounter: Payer: Self-pay | Admitting: Rehabilitative and Restorative Service Providers"

## 2022-09-06 DIAGNOSIS — M6281 Muscle weakness (generalized): Secondary | ICD-10-CM

## 2022-09-06 DIAGNOSIS — R29898 Other symptoms and signs involving the musculoskeletal system: Secondary | ICD-10-CM

## 2022-09-06 DIAGNOSIS — M542 Cervicalgia: Secondary | ICD-10-CM | POA: Diagnosis not present

## 2022-09-06 DIAGNOSIS — R293 Abnormal posture: Secondary | ICD-10-CM | POA: Diagnosis not present

## 2022-09-06 NOTE — Therapy (Signed)
OUTPATIENT PHYSICAL THERAPY CERVICAL TREATMENT   Patient Name: Wendy [name] MRN: [id number] DOB:1945/07/28, 77 y.o., female Today's Date: 09/06/2022  END OF SESSION:  PT End of Session - 09/06/22 1020     Visit Number 6    Number of Visits 24    Date for PT Re-Evaluation 11/02/22    Authorization Type medicare + supplemental    Progress Note Due on Visit 10    PT Start Time 1015    PT Stop Time 1104    PT Time Calculation (min) 49 min    Activity Tolerance Patient tolerated treatment well             Past Medical History:  Diagnosis Date   Anxiety    Atypical chest pain    B12 deficiency    Cervical spondylosis without myelopathy    Diverticulosis of colon    Dysesthesia    GERD (gastroesophageal reflux disease)    Hypercholesteremia    Hypertension    IBS (irritable bowel syndrome)    Malignant melanoma (HCC)    Vitreous hemorrhage (HCC)    Past Surgical History:  Procedure Laterality Date   anterior cervical discectomy and fusion  1996   Dr. 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   Chronic cystitis 01/18/2022   Degeneration of lumbar intervertebral disc 01/18/2022   Lumbar spondylosis 01/18/2022   Lumbar radiculopathy 01/18/2022   Constipation 01/18/2022   Myalgia due to statin 01/18/2022   Aortic atherosclerosis (HCC) 01/18/2022   Hyponatremia 04/30/2021   Weakness 04/30/2021   COVID-19 virus infection 04/30/2021   Pain in wrist 01/16/2019   Neck pain 01/10/2019   Impacted cerumen of both ears 08/07/2018   Eustachian tube  dysfunction, bilateral 08/07/2018   Mixed hyperlipidemia 03/30/2016   Vaginitis, atrophic 05/26/2015   Carotid artery disease (HCC) 02/15/2015   Voiding dysfunction 12/11/2014   Third degree hemorrhoids 10/13/2014   Procidentia of uterus 09/23/2014   Occlusion and stenosis of carotid artery without mention of cerebral infarction 09/02/2013   Bilateral dry eyes 07/14/2012   Actinic keratoses 05/25/2012   Nuclear cataract, nonsenile 12/25/2011   Other seborrheic keratosis 09/18/2011   History of malignant melanoma of skin 05/11/2011   OBSTRUCTIVE SLEEP APNEA 11/19/2009   SNORING 10/11/2009   Abnormal respiratory rate 10/11/2009   LACERATION OF FINGER 09/03/2009   DYSESTHESIA 05/23/2008   MALIGNANT MELANOMA 01/13/2008   ANXIETY 01/13/2008   VITREOUS HEMORRHAGE 01/13/2008   HTN (hypertension) 01/13/2008   DIVERTICULOSIS OF COLON 01/13/2008   IRRITABLE BOWEL SYNDROME 01/13/2008   CERVICAL SPONDYLOSIS WITHOUT MYELOPATHY 01/13/2008   CHEST PAIN, ATYPICAL 01/13/2008   HYPERCHOLESTEROLEMIA 01/10/2008   GERD 01/10/2008    PCP: Dr Adrian Prince  REFERRING PROVIDER: Dr Barnett Abu   REFERRING DIAG: Cervicalgia  THERAPY DIAG:  Cervicalgia  Other symptoms and signs involving the musculoskeletal system  Muscle weakness (generalized)  Abnormal posture  Rationale for Evaluation and Treatment: Rehabilitation  ONSET DATE: 04/13/22  SUBJECTIVE:  SUBJECTIVE STATEMENT: Patient reports that she felt better yesterday. She was not stiff last night but she woke up with stiffness and tightness again this morning. Feels a tightness in the L side of her neck and that 'blocks her head from turning to the L".    EVAL: increase in neck pain following yard work ~ 3-4 months ago. She is better with  Advil but it is worse in the evening. She has some degenerative changes in the cervical spine.  Hand dominance: Right  PERTINENT HISTORY:  HTN; cervical sx with fusion 1996; arthritis; melanoma   PAIN:  Are you having pain? Yes: NPRS scale: 5/10; gets up to 7-8/10 Pain location: posterior cervical spine  Pain description: tightness; nagging; into head; stiffness  Aggravating factors: worse first thing in the morning and toward the end of the day; activities  Relieving factors: OTC antiinflammatory; moving   PRECAUTIONS: None  WEIGHT BEARING RESTRICTIONS: No  FALLS:  Has patient fallen in last 6 months? 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 Cervical spondylosis, slightly progressed at the C3-4 and C4-5 levels. 2. Mild-moderate canal stenosis at C4-5 with moderate right foraminal stenosis. 3. Mild-moderate right foraminal stenosis at C3-4. 4. Prior C5-C7 ACDF with solid arthrodesis.  PATIENT SURVEYS:  FOTO not completed  SENSATION: Intermittent tingling in Rt hand   POSTURE: Patient presents with head forward posture with increased thoracic kyphosis; shoulders rounded and elevated; scapulae abducted and rotated along the thoracic spine; head of the humerus anterior in orientation.   PALPATION: Muscular tightness in ant/lat/post cervical musculature; pecs; upper trap; leveator   CERVICAL ROM:   Active ROM A/PROM (deg) eval  Flexion 41  Extension 40 tight; pain  Right lateral flexion 25 tight   Left lateral flexion 26 tight  Right rotation 51 tight  Left rotation 27 tight    (Blank rows = not tested)  UPPER EXTREMITY STRENGTH: no pain with resistive  testing   Active ROM Right eval Left eval  Shoulder flexion 5 5  Shoulder extension 5 5  Shoulder abduction 5 5  Shoulder adduction    Shoulder extension    Shoulder internal rotation    Shoulder external rotation    Middle trap  4 4  Lower trap  4 4  Wrist flexion    Wrist extension    Wrist ulnar deviation    Wrist radial deviation    Wrist pronation    Wrist supination     (Blank rows = not tested)  UPPER EXTREMITY ROM: end range tightness as noted   MMT Right eval Left eval  Shoulder flexion Tight  Tight   Shoulder extension    Shoulder abduction Tight  Tight   Shoulder adduction    Shoulder extension    Shoulder internal rotation Tight  Tight   Shoulder external rotation    Middle trapezius    Lower trapezius    Elbow flexion    Elbow extension    Wrist flexion    Wrist extension    Wrist ulnar deviation    Wrist radial deviation    Wrist pronation    Wrist supination    Grip strength     (  Blank rows = not tested)  CERVICAL SPECIAL TESTS:  Upper limb tension test (ULTT): Positive - tight R > L    [facility] Adult PT Treatment:                                                DATE: 09/06/22 Therapeutic Exercise: UBE L4 x 4 min alt fwd and back  Supine  Chin tuck 10 sec x 10  Standing  Chin tuck with noodle 5 sec x 5 Doorway stretch 3 positions 30 sec x 2  L's w/noodle along spine yellow TB 3 sec x 10  W's w/noodle along spine yellow TB 3 sec x 10  Row green TB 3 sec x 10  Shoulder extension green TB 3 sec x 10  Sitting  Relaxation series 5-10 reps each exercise  Manual Therapy: STM through ant/lat/post cervical musculature pt supine  Skilled palpation to assess response to manual work  Gentle PROM/stretching into cervical flexion and lateral flexion  Modalities: Uses heat/ice at home. Does not like TENS  Self Care: Education re- importance of upright posture with improved strength in posterior shoulder girdle musculature  Use of noodle for sitting   Use of pillows for electronics when sitting  [facility] Adult PT Treatment:                                                DATE: 09/04/22 Therapeutic Exercise: UBE L2 x 4 min alt fwd and back  Supine  Chin tuck 10 sec x 10  Standing  Chin tuck with noodle 5 sec x 5 Doorway stretch 3 positions 30 sec x 2  L's w/noodle along spine yellow TB 3 sec x 10  W's w/noodle along spine yellow TB 3 sec x 10  Sitting  Relaxation series 5-10 reps each exercise  Manual Therapy: STM through ant/lat/post cervical musculature pt supine  Skilled palpation to assess response to manual work and DN Education re- importance of upright posture with improved strength in posterior shoulder girdle musculature  Modalities: Uses heat/ice at home. Does not like TENS  Self Care: Use of noodle for sitting  Use of pillows for electronics when sitting  [facility] Adult PT Treatment:                                                DATE: 08/30/22 Therapeutic Exercise: Supine  Chin tuck 10 sec x 10  Standing  Chin tuck with noodle 5 sec x 5 Scap squeeze with noodle 10 sec x 10  Doorway stretch 3 positions 30 sec x 2  L's w/noodle along spine yellow TB 3 sec x 10  W's w/noodle along spine yellow TB 3 sec x 10  Row red TB 3 sec x 10  Shoulder extension red TB 3 sec x 10  Manual Therapy: STM through ant/lat/post cervical musculature pt supine  Joint mobs/gene stretching  Skilled palpation to assess response to manual work   Neuromuscular re-ed: Education re- importance of upright posture with improved strength in posterior shoulder girdle musculature  Modalities: Moist heat x 10 min  Does  not like TENS  Self Care: Use of noodle for sitting  Use of pillows for electronics when sitting  [facility] Adult PT Treatment:    (dry needling)       DATE: 08/28/22 Therapeutic Exercise: Supine  Chin tuck 10 sec x 10  Standing  Doorway stretch 3 positions 30 sec x 2  L's w/noodle along spine yellow TB 3 sec x 10  W's w/noodle along  spine yellow TB 3 sec x 10  L's w/noodle along spine yellow TB 3 sec x 10  W's w/noodle along spine yellow TB 3 sec x 10  Manual Therapy: STM through ant/lat/post cervical musculature pt supine  Skilled palpation to assess response to manual work and DN Trigger Point Dry-Needling  Treatment instructions: Expect mild to moderate muscle soreness. S/S of pneumothorax if dry needled over a lung field, and to seek immediate medical attention should they occur. Patient verbalized understanding of these instructions and education.  Patient Consent Given: Yes Education handout provided: Previously provided Muscles treated: suboccipitals; cervical paraspinals; scaleni Electrical stimulation performed: No Parameters: N/A Treatment response/outcome: decreased palpable tightness   Neuromuscular re-ed: Education re- importance of upright posture with improved strength in posterior shoulder girdle musculature  Modalities: Uses heat/ice at home. Does not like TENS  Self Care: Use of noodle for sitting  Use of pillows for electronics when sitting   PATIENT EDUCATION:  Education details: POC; HEP Person educated: Patient Education method: Programmer, multimedia, Demonstration, Tactile cues, Verbal cues, and Handouts Education comprehension: verbalized understanding, returned demonstration, verbal cues required, tactile cues required, and needs further education  HOME EXERCISE PROGRAM: Access Code: [email or web address] URL: [email or web address] Date: 08/30/2022 Prepared by: Corlis Leak  Exercises - Supine Cervical Retraction with Towel  - 2 x daily - 7 x weekly - 1 sets - 5-10 reps - 10 sec  hold - Seated Cervical Retraction  - 2 x daily - 7 x weekly - 1-2 sets - 5-10 reps - 10 sec  hold - Seated Scapular Retraction  - 2 x daily - 7 x weekly - 1-2 sets - 10 reps - 10 sec  hold - Doorway Pec Stretch at 60 Degrees Abduction  - 3 x daily - 7 x weekly - 1 sets - 3 reps - Doorway Pec Stretch at 90 Degrees  Abduction  - 3 x daily - 7 x weekly - 1 sets - 3 reps - 30 seconds  hold - Doorway Pec Stretch at 120 Degrees Abduction  - 3 x daily - 7 x weekly - 1 sets - 3 reps - 30 second hold  hold - Shoulder External Rotation and Scapular Retraction with Resistance  - 2 x daily - 7 x weekly - 1 sets - 10 reps - 3-5 sec  hold - Shoulder W - External Rotation with Resistance  - 2 x daily - 7 x weekly - 1-2 sets - 10 reps - 3 sec  hold - Standing Bilateral Low Shoulder Row with Anchored Resistance  - 2 x daily - 7 x weekly - 1-3 sets - 10 reps - 2-3 sec  hold - Shoulder extension with resistance - Neutral  - 1 x daily - 7 x weekly - 1-2 sets - 10 reps - 3-5 sec  hold  Patient Education - Trigger Point Dry Needling  ASSESSMENT:  CLINICAL IMPRESSION: Some improvement in pain and tightness but patient has persistent pain and tightness through bilat cervical and occipital musculature L > R. Reviewed resistive exercises for posterior shoulder girdle strengthening.  Encouraged work on posture and alignment and exercises.   EVAL: Patient is a 77 y.o. female who was seen today for physical therapy evaluation and treatment for cervicalgia. She has a history of ACDF C5/6 1996 and intermittent cervical pain and dysfunction since that time. Patient reports irritation of cervical symptoms with yard work ~ 4 months ago. She has muscular tightness and pain with movement. Symptoms are worse first thing in the morning and late day. She has some headaches related to tightness in the neck. Patient presents with poor posture and alignment; limited cervical and thoracic/shoulder ROM/mobility; muscular tightness to palpation; postural weakness.  OBJECTIVE IMPAIRMENTS:  ACDF C5/6 1996 and intermittent cervical pain and dysfunction since that time. Patient reports irritation of cervical symptoms with yard work ~ 4 months ago. She has muscular tightness and pain with movement. Symptoms are worse first thing in the morning and late day.  She has some headaches related to tightness in the neck. Patient presents with poor posture and alignment; limited cervical and thoracic/shoulder ROM/mobility; muscular tightness to palpation; postural weaknessdecreased activity tolerance, decreased mobility, decreased ROM, decreased strength, increased fascial restrictions, increased muscle spasms, impaired flexibility, improper body mechanics, postural dysfunction, and pain.    GOALS: Goals reviewed with patient? Yes  SHORT TERM GOALS: Target date: 09/21/2022  Independent in initial HEP  Baseline:  Goal status: INITIAL  2.  75% decrease in frequency and intensity of headaches  Baseline:  Goal status: INITIAL   LONG TERM GOALS: Target date: 11/02/2022   Decrease cervical pain by 75-100% allowing patient to return to all normal functional activities  Baseline:  Goal status: INITIAL  2.  Increase cervical ROM to WFL's with no pain  Baseline:  Goal status: INITIAL  3.  Improve posture and alignment with patient to demonstrate improved upright posture with posterior shoulder girdle engaged  Baseline:  Goal status: INITIAL  4.  5/5 strength posterior shoulder girdle in middle and lower traps  Baseline:  Goal status: INITIAL  5.  Independent in HEP  Baseline:  Goal status: INITIAL  6.  Improve functional limitation score to 56 Baseline: 44 Goal status: INITIAL   PLAN:  PT FREQUENCY: 2x/week  PT DURATION: 12 weeks  PLANNED INTERVENTIONS: Therapeutic exercises, Therapeutic activity, Neuromuscular re-education, Balance training, Gait training, Patient/Family education, Self Care, Joint mobilization, Aquatic Therapy, Dry Needling, Electrical stimulation, Spinal mobilization, Cryotherapy, Moist heat, Taping, Ultrasound, Ionotophoresis 4mg /ml Dexamethasone, Manual therapy, and Re-evaluation  PLAN FOR NEXT SESSION: review and progress exercises program; continue with spine care and body mechanics education; manual work, DN,  modalities as indicated    [employer], PT 09/06/2022, 10:20 AM

## 2022-09-11 ENCOUNTER — Encounter: Payer: Self-pay | Admitting: Rehabilitative and Restorative Service Providers"

## 2022-09-11 ENCOUNTER — Ambulatory Visit: Payer: Medicare Other | Admitting: Rehabilitative and Restorative Service Providers"

## 2022-09-11 DIAGNOSIS — M542 Cervicalgia: Secondary | ICD-10-CM

## 2022-09-11 DIAGNOSIS — R29898 Other symptoms and signs involving the musculoskeletal system: Secondary | ICD-10-CM | POA: Diagnosis not present

## 2022-09-11 DIAGNOSIS — R293 Abnormal posture: Secondary | ICD-10-CM

## 2022-09-11 DIAGNOSIS — M6281 Muscle weakness (generalized): Secondary | ICD-10-CM | POA: Diagnosis not present

## 2022-09-11 NOTE — Therapy (Signed)
OUTPATIENT PHYSICAL THERAPY CERVICAL TREATMENT   Patient Name: Wendy [name] MRN: [id number] DOB:Nov 26, 1945, 77 y.o., female Today's Date: 09/11/2022  END OF SESSION:  PT End of Session - 09/11/22 1102     Visit Number 7    Number of Visits 24    Date for PT Re-Evaluation 11/02/22    Authorization Type medicare + supplemental    Progress Note Due on Visit 10    PT Start Time 1100    PT Stop Time 1150    PT Time Calculation (min) 50 min    Activity Tolerance Patient tolerated treatment well             Past Medical History:  Diagnosis Date   Anxiety    Atypical chest pain    B12 deficiency    Cervical spondylosis without myelopathy    Diverticulosis of colon    Dysesthesia    GERD (gastroesophageal reflux disease)    Hypercholesteremia    Hypertension    IBS (irritable bowel syndrome)    Malignant melanoma (HCC)    Vitreous hemorrhage (HCC)    Past Surgical History:  Procedure Laterality Date   anterior cervical discectomy and fusion  1996   Dr. 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   Chronic cystitis 01/18/2022   Degeneration of lumbar intervertebral disc 01/18/2022   Lumbar spondylosis 01/18/2022   Lumbar radiculopathy 01/18/2022   Constipation 01/18/2022   Myalgia due to statin 01/18/2022   Aortic atherosclerosis (HCC) 01/18/2022   Hyponatremia 04/30/2021   Weakness 04/30/2021   COVID-19 virus infection 04/30/2021   Pain in wrist 01/16/2019   Neck pain 01/10/2019   Impacted cerumen of both ears 08/07/2018   Eustachian tube  dysfunction, bilateral 08/07/2018   Mixed hyperlipidemia 03/30/2016   Vaginitis, atrophic 05/26/2015   Carotid artery disease (HCC) 02/15/2015   Voiding dysfunction 12/11/2014   Third degree hemorrhoids 10/13/2014   Procidentia of uterus 09/23/2014   Occlusion and stenosis of carotid artery without mention of cerebral infarction 09/02/2013   Bilateral dry eyes 07/14/2012   Actinic keratoses 05/25/2012   Nuclear cataract, nonsenile 12/25/2011   Other seborrheic keratosis 09/18/2011   History of malignant melanoma of skin 05/11/2011   OBSTRUCTIVE SLEEP APNEA 11/19/2009   SNORING 10/11/2009   Abnormal respiratory rate 10/11/2009   LACERATION OF FINGER 09/03/2009   DYSESTHESIA 05/23/2008   MALIGNANT MELANOMA 01/13/2008   ANXIETY 01/13/2008   VITREOUS HEMORRHAGE 01/13/2008   HTN (hypertension) 01/13/2008   DIVERTICULOSIS OF COLON 01/13/2008   IRRITABLE BOWEL SYNDROME 01/13/2008   CERVICAL SPONDYLOSIS WITHOUT MYELOPATHY 01/13/2008   CHEST PAIN, ATYPICAL 01/13/2008   HYPERCHOLESTEROLEMIA 01/10/2008   GERD 01/10/2008    PCP: Dr Adrian Prince  REFERRING PROVIDER: Dr Barnett Abu   REFERRING DIAG: Cervicalgia  THERAPY DIAG:  Cervicalgia  Other symptoms and signs involving the musculoskeletal system  Muscle weakness (generalized)  Abnormal posture  Rationale for Evaluation and Treatment: Rehabilitation  ONSET DATE: 04/13/22  SUBJECTIVE:  SUBJECTIVE STATEMENT: Patient reports that she felt better yesterday. She had no pain and did not have to take any medication. She was not as stiff this morning. Feels a tightness in the side of her neck and that 'blocks her head from turning to the L and R". Focusing on keeping chin tucked and thinks that helps a lot.    EVAL: increase in neck  pain following yard work ~ 3-4 months ago. She is better with Advil but it is worse in the evening. She has some degenerative changes in the cervical spine.  Hand dominance: Right  PERTINENT HISTORY:  HTN; cervical sx with fusion 1996; arthritis; melanoma   PAIN:  Are you having pain? Yes: NPRS scale: 0/10; gets up to 7-8/10 Pain location: posterior cervical spine  Pain description: tightness; nagging; into head; stiffness  Aggravating factors: worse first thing in the morning and toward the end of the day; activities  Relieving factors: OTC antiinflammatory; moving   PRECAUTIONS: None  WEIGHT BEARING RESTRICTIONS: No  FALLS:  Has patient fallen in last 6 months? 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 Cervical spondylosis, slightly progressed at the C3-4 and C4-5 levels. 2. Mild-moderate canal stenosis at C4-5 with moderate right foraminal stenosis. 3. Mild-moderate right foraminal stenosis at C3-4. 4. Prior C5-C7 ACDF with solid arthrodesis.  PATIENT SURVEYS:  FOTO not completed  SENSATION: Intermittent tingling in Rt hand   POSTURE: Patient presents with head forward posture with increased thoracic kyphosis; shoulders rounded and elevated; scapulae abducted and rotated along the thoracic spine; head of the humerus anterior in orientation.   PALPATION: Muscular tightness in ant/lat/post cervical musculature; pecs; upper trap; leveator   CERVICAL ROM:   Active ROM A/PROM (deg) eval  Flexion 41  Extension 40 tight; pain  Right lateral flexion 25 tight   Left lateral flexion 26 tight  Right rotation 51 tight  Left rotation 27 tight    (Blank rows = not  tested)  UPPER EXTREMITY STRENGTH: no pain with resistive testing   Active ROM Right eval Left eval  Shoulder flexion 5 5  Shoulder extension 5 5  Shoulder abduction 5 5  Shoulder adduction    Shoulder extension    Shoulder internal rotation    Shoulder external rotation    Middle trap  4 4  Lower trap  4 4  Wrist flexion    Wrist extension    Wrist ulnar deviation    Wrist radial deviation    Wrist pronation    Wrist supination     (Blank rows = not tested)  UPPER EXTREMITY ROM: end range tightness as noted   MMT Right eval Left eval  Shoulder flexion Tight  Tight   Shoulder extension    Shoulder abduction Tight  Tight   Shoulder adduction    Shoulder extension    Shoulder internal rotation Tight  Tight   Shoulder external rotation    Middle trapezius    Lower trapezius    Elbow flexion    Elbow extension    Wrist flexion    Wrist extension    Wrist ulnar deviation    Wrist radial deviation  Wrist pronation    Wrist supination    Grip strength     (Blank rows = not tested)  CERVICAL SPECIAL TESTS:  Upper limb tension test (ULTT): Positive - tight R > L    [facility] Adult PT Treatment:                                                DATE: 09/11/22 Therapeutic Exercise: UBE L4 x 4 min alt fwd and back  Supine  Chin tuck 10 sec x 10  Standing  Chin tuck with noodle 5 sec x 5 Doorway stretch 3 positions 30 sec x 2  L's w/noodle along spine yellow TB 3 sec x 10  W's w/noodle along spine yellow TB 3 sec x 10  Row green TB 3 sec x 10  Shoulder extension green TB 3 sec x 10  Bow and arrow green TB 3 sec x 10  Sitting  Relaxation series 5-10 reps each exercise  Manual Therapy: STM through ant/lat/post cervical musculature pt supine  Skilled palpation to assess response to manual work  Gentle PROM/stretching into cervical flexion and lateral flexion  Modalities: Uses heat/ice at home. Does not like TENS  Self Care: Education re- importance of upright  posture with improved strength in posterior shoulder girdle musculature  Use of noodle for sitting  Use of pillows for electronics when sitting  [facility] Adult PT Treatment:                                                DATE: 09/06/22 Therapeutic Exercise: UBE L4 x 4 min alt fwd and back  Supine  Chin tuck 10 sec x 10  Standing  Chin tuck with noodle 5 sec x 5 Doorway stretch 3 positions 30 sec x 2  L's w/noodle along spine yellow TB 3 sec x 10  W's w/noodle along spine yellow TB 3 sec x 10  Row green TB 3 sec x 10  Shoulder extension green TB 3 sec x 10  Sitting  Relaxation series 5-10 reps each exercise  Manual Therapy: STM through ant/lat/post cervical musculature pt supine  Skilled palpation to assess response to manual work  Gentle PROM/stretching into cervical flexion and lateral flexion  Modalities: Uses heat/ice at home. Does not like TENS  Self Care: Education re- importance of upright posture with improved strength in posterior shoulder girdle musculature  Use of noodle for sitting  Use of pillows for electronics when sitting   [facility] Adult PT Treatment:    (dry needling)       DATE: 08/28/22 Therapeutic Exercise: Supine  Chin tuck 10 sec x 10  Standing  Doorway stretch 3 positions 30 sec x 2  L's w/noodle along spine yellow TB 3 sec x 10  W's w/noodle along spine yellow TB 3 sec x 10  L's w/noodle along spine yellow TB 3 sec x 10  W's w/noodle along spine yellow TB 3 sec x 10  Manual Therapy: STM through ant/lat/post cervical musculature pt supine  Skilled palpation to assess response to manual work and DN Trigger Point Dry-Needling  Treatment instructions: Expect mild to moderate muscle soreness. S/S of pneumothorax if dry needled over a lung field, and to seek  immediate medical attention should they occur. Patient verbalized understanding of these instructions and education.  Patient Consent Given: Yes Education handout provided: Previously provided Muscles  treated: suboccipitals; cervical paraspinals; scaleni Electrical stimulation performed: No Parameters: N/A Treatment response/outcome: decreased palpable tightness   Neuromuscular re-ed: Education re- importance of upright posture with improved strength in posterior shoulder girdle musculature  Modalities: Uses heat/ice at home. Does not like TENS  Self Care: Use of noodle for sitting  Use of pillows for electronics when sitting   PATIENT EDUCATION:  Education details: POC; HEP Person educated: Patient Education method: Programmer, multimedia, Demonstration, Tactile cues, Verbal cues, and Handouts Education comprehension: verbalized understanding, returned demonstration, verbal cues required, tactile cues required, and needs further education  HOME EXERCISE PROGRAM: Access Code: [email or web address] URL: [email or web address] Date: 09/11/2022 Prepared by: Corlis Leak  Exercises - Supine Cervical Retraction with Towel  - 2 x daily - 7 x weekly - 1 sets - 5-10 reps - 10 sec  hold - Seated Cervical Retraction  - 2 x daily - 7 x weekly - 1-2 sets - 5-10 reps - 10 sec  hold - Seated Scapular Retraction  - 2 x daily - 7 x weekly - 1-2 sets - 10 reps - 10 sec  hold - Doorway Pec Stretch at 60 Degrees Abduction  - 3 x daily - 7 x weekly - 1 sets - 3 reps - Doorway Pec Stretch at 90 Degrees Abduction  - 3 x daily - 7 x weekly - 1 sets - 3 reps - 30 seconds  hold - Doorway Pec Stretch at 120 Degrees Abduction  - 3 x daily - 7 x weekly - 1 sets - 3 reps - 30 second hold  hold - Shoulder External Rotation and Scapular Retraction with Resistance  - 2 x daily - 7 x weekly - 1 sets - 10 reps - 3-5 sec  hold - Shoulder W - External Rotation with Resistance  - 2 x daily - 7 x weekly - 1-2 sets - 10 reps - 3 sec  hold - Standing Bilateral Low Shoulder Row with Anchored Resistance  - 2 x daily - 7 x weekly - 1-3 sets - 10 reps - 2-3 sec  hold - Shoulder extension with resistance - Neutral  - 1 x daily - 7 x  weekly - 1-2 sets - 10 reps - 3-5 sec  hold - Drawing Bow  - 1 x daily - 7 x weekly - 1 sets - 10 reps - 3 sec  hold Patient Education - Trigger Point Dry Needling  ASSESSMENT:  CLINICAL IMPRESSION: Improving with less pain and decreasing tightness through bilat cervical and occipital musculature L > R. Reviewed and progressed resistive exercises for posterior shoulder girdle strengthening. Encouraged work on posture and alignment and exercises.   EVAL: Patient is a 77 y.o. female who was seen today for physical therapy evaluation and treatment for cervicalgia. She has a history of ACDF C5/6 1996 and intermittent cervical pain and dysfunction since that time. Patient reports irritation of cervical symptoms with yard work ~ 4 months ago. She has muscular tightness and pain with movement. Symptoms are worse first thing in the morning and late day. She has some headaches related to tightness in the neck. Patient presents with poor posture and alignment; limited cervical and thoracic/shoulder ROM/mobility; muscular tightness to palpation; postural weakness.  OBJECTIVE IMPAIRMENTS:  ACDF C5/6 1996 and intermittent cervical pain and dysfunction since that time. Patient reports irritation of cervical symptoms with yard  work ~ 4 months ago. She has muscular tightness and pain with movement. Symptoms are worse first thing in the morning and late day. She has some headaches related to tightness in the neck. Patient presents with poor posture and alignment; limited cervical and thoracic/shoulder ROM/mobility; muscular tightness to palpation; postural weaknessdecreased activity tolerance, decreased mobility, decreased ROM, decreased strength, increased fascial restrictions, increased muscle spasms, impaired flexibility, improper body mechanics, postural dysfunction, and pain.    GOALS: Goals reviewed with patient? Yes  SHORT TERM GOALS: Target date: 09/21/2022  Independent in initial HEP  Baseline:  Goal  status: INITIAL  2.  75% decrease in frequency and intensity of headaches  Baseline:  Goal status: INITIAL   LONG TERM GOALS: Target date: 11/02/2022   Decrease cervical pain by 75-100% allowing patient to return to all normal functional activities  Baseline:  Goal status: INITIAL  2.  Increase cervical ROM to WFL's with no pain  Baseline:  Goal status: INITIAL  3.  Improve posture and alignment with patient to demonstrate improved upright posture with posterior shoulder girdle engaged  Baseline:  Goal status: INITIAL  4.  5/5 strength posterior shoulder girdle in middle and lower traps  Baseline:  Goal status: INITIAL  5.  Independent in HEP  Baseline:  Goal status: INITIAL  6.  Improve functional limitation score to 56 Baseline: 44 Goal status: INITIAL   PLAN:  PT FREQUENCY: 2x/week  PT DURATION: 12 weeks  PLANNED INTERVENTIONS: Therapeutic exercises, Therapeutic activity, Neuromuscular re-education, Balance training, Gait training, Patient/Family education, Self Care, Joint mobilization, Aquatic Therapy, Dry Needling, Electrical stimulation, Spinal mobilization, Cryotherapy, Moist heat, Taping, Ultrasound, Ionotophoresis 4mg /ml Dexamethasone, Manual therapy, and Re-evaluation  PLAN FOR NEXT SESSION: review and progress exercises program; continue with spine care and body mechanics education; manual work, DN, modalities as indicated    [employer], PT 09/11/2022, 11:03 AM

## 2022-09-12 DIAGNOSIS — D1801 Hemangioma of skin and subcutaneous tissue: Secondary | ICD-10-CM | POA: Diagnosis not present

## 2022-09-12 DIAGNOSIS — Z85820 Personal history of malignant melanoma of skin: Secondary | ICD-10-CM | POA: Diagnosis not present

## 2022-09-12 DIAGNOSIS — L814 Other melanin hyperpigmentation: Secondary | ICD-10-CM | POA: Diagnosis not present

## 2022-09-12 DIAGNOSIS — Z85828 Personal history of other malignant neoplasm of skin: Secondary | ICD-10-CM | POA: Diagnosis not present

## 2022-09-12 DIAGNOSIS — L821 Other seborrheic keratosis: Secondary | ICD-10-CM | POA: Diagnosis not present

## 2022-09-12 DIAGNOSIS — L578 Other skin changes due to chronic exposure to nonionizing radiation: Secondary | ICD-10-CM | POA: Diagnosis not present

## 2022-09-12 DIAGNOSIS — L570 Actinic keratosis: Secondary | ICD-10-CM | POA: Diagnosis not present

## 2022-09-13 ENCOUNTER — Encounter: Payer: Self-pay | Admitting: Rehabilitative and Restorative Service Providers"

## 2022-09-13 ENCOUNTER — Ambulatory Visit: Payer: Medicare Other | Admitting: Rehabilitative and Restorative Service Providers"

## 2022-09-13 DIAGNOSIS — R29898 Other symptoms and signs involving the musculoskeletal system: Secondary | ICD-10-CM

## 2022-09-13 DIAGNOSIS — R293 Abnormal posture: Secondary | ICD-10-CM

## 2022-09-13 DIAGNOSIS — M542 Cervicalgia: Secondary | ICD-10-CM | POA: Diagnosis not present

## 2022-09-13 DIAGNOSIS — M6281 Muscle weakness (generalized): Secondary | ICD-10-CM

## 2022-09-13 NOTE — Therapy (Signed)
OUTPATIENT PHYSICAL THERAPY CERVICAL TREATMENT   Patient Name: Wendy [name] MRN: [id number] DOB:July 28, 1945, 77 y.o., female Today's Date: 09/13/2022  END OF SESSION:  PT End of Session - 09/13/22 1013     Visit Number 8    Number of Visits 24    Date for PT Re-Evaluation 11/02/22    Authorization Type medicare + supplemental    Progress Note Due on Visit 10    PT Start Time 1012    PT Stop Time 1100    PT Time Calculation (min) 48 min    Activity Tolerance Patient tolerated treatment well             Past Medical History:  Diagnosis Date   Anxiety    Atypical chest pain    B12 deficiency    Cervical spondylosis without myelopathy    Diverticulosis of colon    Dysesthesia    GERD (gastroesophageal reflux disease)    Hypercholesteremia    Hypertension    IBS (irritable bowel syndrome)    Malignant melanoma (HCC)    Vitreous hemorrhage (HCC)    Past Surgical History:  Procedure Laterality Date   anterior cervical discectomy and fusion  1996   Dr. 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   Chronic cystitis 01/18/2022   Degeneration of lumbar intervertebral disc 01/18/2022   Lumbar spondylosis 01/18/2022   Lumbar radiculopathy 01/18/2022   Constipation 01/18/2022   Myalgia due to statin 01/18/2022   Aortic atherosclerosis (HCC) 01/18/2022   Hyponatremia 04/30/2021   Weakness 04/30/2021   COVID-19 virus infection 04/30/2021   Pain in wrist 01/16/2019   Neck pain 01/10/2019   Impacted cerumen of both ears 08/07/2018   Eustachian tube  dysfunction, bilateral 08/07/2018   Mixed hyperlipidemia 03/30/2016   Vaginitis, atrophic 05/26/2015   Carotid artery disease (HCC) 02/15/2015   Voiding dysfunction 12/11/2014   Third degree hemorrhoids 10/13/2014   Procidentia of uterus 09/23/2014   Occlusion and stenosis of carotid artery without mention of cerebral infarction 09/02/2013   Bilateral dry eyes 07/14/2012   Actinic keratoses 05/25/2012   Nuclear cataract, nonsenile 12/25/2011   Other seborrheic keratosis 09/18/2011   History of malignant melanoma of skin 05/11/2011   OBSTRUCTIVE SLEEP APNEA 11/19/2009   SNORING 10/11/2009   Abnormal respiratory rate 10/11/2009   LACERATION OF FINGER 09/03/2009   DYSESTHESIA 05/23/2008   MALIGNANT MELANOMA 01/13/2008   ANXIETY 01/13/2008   VITREOUS HEMORRHAGE 01/13/2008   HTN (hypertension) 01/13/2008   DIVERTICULOSIS OF COLON 01/13/2008   IRRITABLE BOWEL SYNDROME 01/13/2008   CERVICAL SPONDYLOSIS WITHOUT MYELOPATHY 01/13/2008   CHEST PAIN, ATYPICAL 01/13/2008   HYPERCHOLESTEROLEMIA 01/10/2008   GERD 01/10/2008    PCP: Dr Adrian Prince  REFERRING PROVIDER: Dr Barnett Abu   REFERRING DIAG: Cervicalgia  THERAPY DIAG:  Cervicalgia  Other symptoms and signs involving the musculoskeletal system  Muscle weakness (generalized)  Abnormal posture  Rationale for Evaluation and Treatment: Rehabilitation  ONSET DATE: 04/13/22  SUBJECTIVE:  SUBJECTIVE STATEMENT: Patient reports that she has increased pain in her neck this morning. She was at the dentist for 2.5 hours for a crown yesterday afternoon. Feels a tightness in the side of her neck and that 'blocks her head from turning to the L and R". Focusing on keeping chin tucked and thinks that helps a lot.    EVAL: increase in neck  pain following yard work ~ 3-4 months ago. She is better with Advil but it is worse in the evening. She has some degenerative changes in the cervical spine.  Hand dominance: Right  PERTINENT HISTORY:  HTN; cervical sx with fusion 1996; arthritis; melanoma   PAIN:  Are you having pain? Yes: NPRS scale: 7/10; gets up to 7-8/10 Pain location: posterior cervical spine  Pain description: tightness; nagging; into head; stiffness  Aggravating factors: worse first thing in the morning and toward the end of the day; activities  Relieving factors: OTC antiinflammatory; moving   PRECAUTIONS: None  WEIGHT BEARING RESTRICTIONS: No  FALLS:  Has patient fallen in last 6 months? 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 Cervical spondylosis, slightly progressed at the C3-4 and C4-5 levels. 2. Mild-moderate canal stenosis at C4-5 with moderate right foraminal stenosis. 3. Mild-moderate right foraminal stenosis at C3-4. 4. Prior C5-C7 ACDF with solid arthrodesis.  PATIENT SURVEYS:  FOTO not completed  SENSATION: Intermittent tingling in Rt hand   POSTURE: Patient presents with head forward posture with increased thoracic kyphosis; shoulders rounded and elevated; scapulae abducted and rotated along the thoracic spine; head of the humerus anterior in orientation.   PALPATION: Muscular tightness in ant/lat/post cervical musculature; pecs; upper trap; leveator   CERVICAL ROM:   Active ROM A/PROM (deg) eval  Flexion 41  Extension 40 tight; pain  Right lateral flexion 25 tight   Left lateral flexion 26 tight  Right rotation 51 tight  Left rotation 27 tight    (Blank rows = not  tested)  UPPER EXTREMITY STRENGTH: no pain with resistive testing   Active ROM Right eval Left eval  Shoulder flexion 5 5  Shoulder extension 5 5  Shoulder abduction 5 5  Shoulder adduction    Shoulder extension    Shoulder internal rotation    Shoulder external rotation    Middle trap  4 4  Lower trap  4 4  Wrist flexion    Wrist extension    Wrist ulnar deviation    Wrist radial deviation    Wrist pronation    Wrist supination     (Blank rows = not tested)  UPPER EXTREMITY ROM: end range tightness as noted   MMT Right eval Left eval  Shoulder flexion Tight  Tight   Shoulder extension    Shoulder abduction Tight  Tight   Shoulder adduction    Shoulder extension    Shoulder internal rotation Tight  Tight   Shoulder external rotation    Middle trapezius    Lower trapezius    Elbow flexion    Elbow extension    Wrist flexion    Wrist extension    Wrist ulnar deviation    Wrist radial deviation    Wrist  pronation    Wrist supination    Grip strength     (Blank rows = not tested)  CERVICAL SPECIAL TESTS:  Upper limb tension test (ULTT): Positive - tight R > L    [facility] Adult PT Treatment:                                                DATE: 09/13/22 Therapeutic Exercise: UBE L4 x 4 min alt fwd and back every few seconds as patient decides Supine  Chin tuck 10 sec x 10  Standing  Chin tuck with noodle 5 sec x 5 Doorway stretch 3 positions 30 sec x 2  L's w/noodle along spine yellow TB 3 sec x 10  W's w/noodle along spine yellow TB 3 sec x 10  Row green TB 3 sec x 10  Shoulder extension green TB 3 sec x 10  Bow and arrow green TB 3 sec x 10  Sitting  Relaxation series 5-10 reps each exercise  Manual Therapy: STM through ant/lat/post cervical musculature pt supine  Skilled palpation to assess response to manual work  Gentle PROM/stretching into cervical flexion and lateral flexion  Modalities: Moist heat x 10 min. Uses heat/ice at home. Does not like  TENS  Self Care: Education re- importance of upright posture with improved strength in posterior shoulder girdle musculature  Use of noodle for sitting  Use of pillows for electronics when sitting  [facility] Adult PT Treatment:                                                DATE: 09/11/22 Therapeutic Exercise: UBE L4 x 4 min alt fwd and back   Supine  Chin tuck 10 sec x 10  Standing  Chin tuck with noodle 5 sec x 5 Doorway stretch 3 positions 30 sec x 2  L's w/noodle along spine yellow TB 3 sec x 10  W's w/noodle along spine yellow TB 3 sec x 10  Row green TB 3 sec x 10  Shoulder extension green TB 3 sec x 10  Bow and arrow green TB 3 sec x 10  Sitting  Relaxation series 5-10 reps each exercise  Manual Therapy: STM through ant/lat/post cervical musculature pt supine  Skilled palpation to assess response to manual work  Gentle PROM/stretching into cervical flexion and lateral flexion  Modalities: Uses heat/ice at home. Does not like TENS  Self Care: Education re- importance of upright posture with improved strength in posterior shoulder girdle musculature  Use of noodle for sitting  Use of pillows for electronics when sitting   [facility] Adult PT Treatment:    (dry needling)       DATE: 08/28/22 Therapeutic Exercise: Supine  Chin tuck 10 sec x 10  Standing  Doorway stretch 3 positions 30 sec x 2  L's w/noodle along spine yellow TB 3 sec x 10  W's w/noodle along spine yellow TB 3 sec x 10  L's w/noodle along spine yellow TB 3 sec x 10  W's w/noodle along spine yellow TB 3 sec x 10  Manual Therapy: STM through ant/lat/post cervical musculature pt supine  Skilled palpation to assess response to manual work and DN Civil engineer, contracting  instructions: Expect mild to moderate muscle soreness. S/S of pneumothorax if dry needled over a lung field, and to seek immediate medical attention should they occur. Patient verbalized understanding of these instructions and  education.  Patient Consent Given: Yes Education handout provided: Previously provided Muscles treated: suboccipitals; cervical paraspinals; scaleni Electrical stimulation performed: No Parameters: N/A Treatment response/outcome: decreased palpable tightness   Neuromuscular re-ed: Education re- importance of upright posture with improved strength in posterior shoulder girdle musculature  Modalities: Uses heat/ice at home. Does not like TENS  Self Care: Use of noodle for sitting  Use of pillows for electronics when sitting   PATIENT EDUCATION:  Education details: POC; HEP Person educated: Patient Education method: Programmer, multimedia, Demonstration, Tactile cues, Verbal cues, and Handouts Education comprehension: verbalized understanding, returned demonstration, verbal cues required, tactile cues required, and needs further education  HOME EXERCISE PROGRAM: Access Code: [email or web address] URL: [email or web address] Date: 09/11/2022 Prepared by: Corlis Leak  Exercises - Supine Cervical Retraction with Towel  - 2 x daily - 7 x weekly - 1 sets - 5-10 reps - 10 sec  hold - Seated Cervical Retraction  - 2 x daily - 7 x weekly - 1-2 sets - 5-10 reps - 10 sec  hold - Seated Scapular Retraction  - 2 x daily - 7 x weekly - 1-2 sets - 10 reps - 10 sec  hold - Doorway Pec Stretch at 60 Degrees Abduction  - 3 x daily - 7 x weekly - 1 sets - 3 reps - Doorway Pec Stretch at 90 Degrees Abduction  - 3 x daily - 7 x weekly - 1 sets - 3 reps - 30 seconds  hold - Doorway Pec Stretch at 120 Degrees Abduction  - 3 x daily - 7 x weekly - 1 sets - 3 reps - 30 second hold  hold - Shoulder External Rotation and Scapular Retraction with Resistance  - 2 x daily - 7 x weekly - 1 sets - 10 reps - 3-5 sec  hold - Shoulder W - External Rotation with Resistance  - 2 x daily - 7 x weekly - 1-2 sets - 10 reps - 3 sec  hold - Standing Bilateral Low Shoulder Row with Anchored Resistance  - 2 x daily - 7 x weekly - 1-3  sets - 10 reps - 2-3 sec  hold - Shoulder extension with resistance - Neutral  - 1 x daily - 7 x weekly - 1-2 sets - 10 reps - 3-5 sec  hold - Drawing Bow  - 1 x daily - 7 x weekly - 1 sets - 10 reps - 3 sec  hold Patient Education - Trigger Point Dry Needling  ASSESSMENT:  CLINICAL IMPRESSION: Flare up of symptoms today due to extensive dental work yesterday. Focus on STM and myofacial release to address increased pain and tightness. Reviewed exercises for cervical ROM and posterior shoulder girdle strengthening. Encouraged work on posture and alignment and exercises, keeping chin tucked to avoid extension at higher level cervical spine.   EVAL: Patient is a 77 y.o. female who was seen today for physical therapy evaluation and treatment for cervicalgia. She has a history of ACDF C5/6 1996 and intermittent cervical pain and dysfunction since that time. Patient reports irritation of cervical symptoms with yard work ~ 4 months ago. She has muscular tightness and pain with movement. Symptoms are worse first thing in the morning and late day. She has some headaches related to tightness in the neck. Patient presents with poor  posture and alignment; limited cervical and thoracic/shoulder ROM/mobility; muscular tightness to palpation; postural weakness.  OBJECTIVE IMPAIRMENTS:  ACDF C5/6 1996 and intermittent cervical pain and dysfunction since that time. Patient reports irritation of cervical symptoms with yard work ~ 4 months ago. She has muscular tightness and pain with movement. Symptoms are worse first thing in the morning and late day. She has some headaches related to tightness in the neck. Patient presents with poor posture and alignment; limited cervical and thoracic/shoulder ROM/mobility; muscular tightness to palpation; postural weaknessdecreased activity tolerance, decreased mobility, decreased ROM, decreased strength, increased fascial restrictions, increased muscle spasms, impaired flexibility,  improper body mechanics, postural dysfunction, and pain.    GOALS: Goals reviewed with patient? Yes  SHORT TERM GOALS: Target date: 09/21/2022  Independent in initial HEP  Baseline:  Goal status: INITIAL  2.  75% decrease in frequency and intensity of headaches  Baseline:  Goal status: INITIAL   LONG TERM GOALS: Target date: 11/02/2022   Decrease cervical pain by 75-100% allowing patient to return to all normal functional activities  Baseline:  Goal status: INITIAL  2.  Increase cervical ROM to WFL's with no pain  Baseline:  Goal status: INITIAL  3.  Improve posture and alignment with patient to demonstrate improved upright posture with posterior shoulder girdle engaged  Baseline:  Goal status: INITIAL  4.  5/5 strength posterior shoulder girdle in middle and lower traps  Baseline:  Goal status: INITIAL  5.  Independent in HEP  Baseline:  Goal status: INITIAL  6.  Improve functional limitation score to 56 Baseline: 44 Goal status: INITIAL   PLAN:  PT FREQUENCY: 2x/week  PT DURATION: 12 weeks  PLANNED INTERVENTIONS: Therapeutic exercises, Therapeutic activity, Neuromuscular re-education, Balance training, Gait training, Patient/Family education, Self Care, Joint mobilization, Aquatic Therapy, Dry Needling, Electrical stimulation, Spinal mobilization, Cryotherapy, Moist heat, Taping, Ultrasound, Ionotophoresis 4mg /ml Dexamethasone, Manual therapy, and Re-evaluation  PLAN FOR NEXT SESSION: review and progress exercises program; continue with spine care and body mechanics education; manual work, DN, modalities as indicated    [employer], PT 09/13/2022, 10:14 AM

## 2022-09-18 ENCOUNTER — Ambulatory Visit: Payer: Medicare Other | Attending: Cardiovascular Disease

## 2022-09-18 DIAGNOSIS — E7800 Pure hypercholesterolemia, unspecified: Secondary | ICD-10-CM | POA: Diagnosis not present

## 2022-09-19 ENCOUNTER — Ambulatory Visit: Payer: Medicare Other | Admitting: Rehabilitative and Restorative Service Providers"

## 2022-09-19 ENCOUNTER — Encounter: Payer: Self-pay | Admitting: Rehabilitative and Restorative Service Providers"

## 2022-09-19 DIAGNOSIS — R29898 Other symptoms and signs involving the musculoskeletal system: Secondary | ICD-10-CM | POA: Diagnosis not present

## 2022-09-19 DIAGNOSIS — M542 Cervicalgia: Secondary | ICD-10-CM

## 2022-09-19 DIAGNOSIS — R293 Abnormal posture: Secondary | ICD-10-CM

## 2022-09-19 DIAGNOSIS — M6281 Muscle weakness (generalized): Secondary | ICD-10-CM | POA: Diagnosis not present

## 2022-09-19 LAB — LIPID PANEL
Chol/HDL Ratio: 3.6 ratio (ref 0.0–4.4)
Cholesterol, Total: 197 mg/dL (ref 100–199)
HDL: 55 mg/dL (ref >39)
LDL Chol Calc (NIH): 110 mg/dL — ABNORMAL HIGH (ref 0–99)
Triglycerides: 185 mg/dL — ABNORMAL HIGH (ref 0–149)
VLDL Cholesterol Cal: 32 mg/dL (ref 5–40)

## 2022-09-19 LAB — HEPATIC FUNCTION PANEL
ALT: 35 IU/L — ABNORMAL HIGH (ref 0–32)
AST: 30 IU/L (ref 0–40)
Albumin: 4.6 g/dL (ref 3.8–4.8)
Alkaline Phosphatase: 53 IU/L (ref 44–121)
Bilirubin Total: 0.4 mg/dL (ref 0.0–1.2)
Bilirubin, Direct: 0.14 mg/dL (ref 0.00–0.40)
Total Protein: 6.7 g/dL (ref 6.0–8.5)

## 2022-09-19 NOTE — Therapy (Signed)
OUTPATIENT PHYSICAL THERAPY CERVICAL TREATMENT   Patient Name: Wendy [name] MRN: [id number] DOB:May 08, 1945, 77 y.o., female Today's Date: 09/19/2022  END OF SESSION:  PT End of Session - 09/19/22 1021     Visit Number 9    Number of Visits 24    Date for PT Re-Evaluation 11/02/22    Progress Note Due on Visit 10    PT Start Time 1018    PT Stop Time 1107    PT Time Calculation (min) 49 min    Activity Tolerance Patient tolerated treatment well             Past Medical History:  Diagnosis Date   Anxiety    Atypical chest pain    B12 deficiency    Cervical spondylosis without myelopathy    Diverticulosis of colon    Dysesthesia    GERD (gastroesophageal reflux disease)    Hypercholesteremia    Hypertension    IBS (irritable bowel syndrome)    Malignant melanoma (HCC)    Vitreous hemorrhage (HCC)    Past Surgical History:  Procedure Laterality Date   anterior cervical discectomy and fusion  1996   Dr. 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   Chronic cystitis 01/18/2022   Degeneration of lumbar intervertebral disc 01/18/2022   Lumbar spondylosis 01/18/2022   Lumbar radiculopathy 01/18/2022   Constipation 01/18/2022   Myalgia due to statin 01/18/2022   Aortic atherosclerosis (HCC) 01/18/2022   Hyponatremia 04/30/2021   Weakness 04/30/2021   COVID-19 virus infection 04/30/2021   Pain in wrist 01/16/2019   Neck pain 01/10/2019   Impacted cerumen of both ears 08/07/2018   Eustachian tube dysfunction, bilateral 08/07/2018   Mixed  hyperlipidemia 03/30/2016   Vaginitis, atrophic 05/26/2015   Carotid artery disease (HCC) 02/15/2015   Voiding dysfunction 12/11/2014   Third degree hemorrhoids 10/13/2014   Procidentia of uterus 09/23/2014   Occlusion and stenosis of carotid artery without mention of cerebral infarction 09/02/2013   Bilateral dry eyes 07/14/2012   Actinic keratoses 05/25/2012   Nuclear cataract, nonsenile 12/25/2011   Other seborrheic keratosis 09/18/2011   History of malignant melanoma of skin 05/11/2011   OBSTRUCTIVE SLEEP APNEA 11/19/2009   SNORING 10/11/2009   Abnormal respiratory rate 10/11/2009   LACERATION OF FINGER 09/03/2009   DYSESTHESIA 05/23/2008   MALIGNANT MELANOMA 01/13/2008   ANXIETY 01/13/2008   VITREOUS HEMORRHAGE 01/13/2008   HTN (hypertension) 01/13/2008   DIVERTICULOSIS OF COLON 01/13/2008   IRRITABLE BOWEL SYNDROME 01/13/2008   CERVICAL SPONDYLOSIS WITHOUT MYELOPATHY 01/13/2008   CHEST PAIN, ATYPICAL 01/13/2008   HYPERCHOLESTEROLEMIA 01/10/2008   GERD 01/10/2008    PCP: Dr Adrian Prince  REFERRING PROVIDER: Dr Barnett Abu   REFERRING DIAG: Cervicalgia  THERAPY DIAG:  Cervicalgia  Other symptoms and signs involving the musculoskeletal system  Muscle weakness (generalized)  Abnormal posture  Rationale for Evaluation and Treatment: Rehabilitation  ONSET DATE: 04/13/22  SUBJECTIVE:  SUBJECTIVE STATEMENT: Patient reports that she has increased pain in her neck during the night and this morning. She has increased pain on the L side. Focusing on keeping chin tucked and thinks that helps a lot. Hair appt lthis week.    EVAL: increase in neck pain following yard work ~ 3-4 months ago. She is better with Advil but it is worse in the evening. She has some degenerative  changes in the cervical spine.  Hand dominance: Right  PERTINENT HISTORY:  HTN; cervical sx with fusion 1996; arthritis; melanoma   PAIN:  Are you having pain? Yes: NPRS scale: 6/10; gets up to 7-8/10 Pain location: posterior cervical spine  Pain description: tightness; nagging; into head; stiffness  Aggravating factors: worse first thing in the morning and toward the end of the day; activities  Relieving factors: OTC antiinflammatory; moving   PRECAUTIONS: None  WEIGHT BEARING RESTRICTIONS: No  FALLS:  Has patient fallen in last 6 months? 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 Cervical spondylosis, slightly progressed at the C3-4 and C4-5 levels. 2. Mild-moderate canal stenosis at C4-5 with moderate right foraminal stenosis. 3. Mild-moderate right foraminal stenosis at C3-4. 4. Prior C5-C7 ACDF with solid arthrodesis.  PATIENT SURVEYS:  FOTO not completed  SENSATION: Intermittent tingling in Rt hand   POSTURE: Patient presents with head forward posture with increased thoracic kyphosis; shoulders rounded and elevated; scapulae abducted and rotated along the thoracic spine; head of the humerus anterior in orientation.   PALPATION: Muscular tightness in ant/lat/post cervical musculature; pecs; upper trap; leveator   CERVICAL ROM:   Active ROM A/PROM (deg) eval  Flexion 41  Extension 40 tight; pain  Right lateral flexion 25 tight   Left lateral flexion 26 tight  Right rotation 51 tight  Left rotation 27 tight    (Blank rows = not tested)  UPPER EXTREMITY STRENGTH: no pain with resistive testing   Active ROM Right eval Left eval  Shoulder flexion 5 5   Shoulder extension 5 5  Shoulder abduction 5 5  Shoulder adduction    Shoulder extension    Shoulder internal rotation    Shoulder external rotation    Middle trap  4 4  Lower trap  4 4  Wrist flexion    Wrist extension    Wrist ulnar deviation    Wrist radial deviation    Wrist pronation    Wrist supination     (Blank rows = not tested)  UPPER EXTREMITY ROM: end range tightness as noted   MMT Right eval Left eval  Shoulder flexion Tight  Tight   Shoulder extension    Shoulder abduction Tight  Tight   Shoulder adduction    Shoulder extension    Shoulder internal rotation Tight  Tight   Shoulder external rotation    Middle trapezius    Lower trapezius    Elbow flexion    Elbow extension    Wrist flexion    Wrist extension    Wrist ulnar deviation    Wrist radial deviation    Wrist pronation    Wrist supination    Grip strength     (Blank rows =  not tested)  CERVICAL SPECIAL TESTS:  Upper limb tension test (ULTT): Positive - tight R > L   [facility] Adult PT Treatment:    (dry needling)       DATE: 09/19/22 Therapeutic Exercise: Supine  Chin tuck 10 sec x 10  Standing  Doorway stretch 3 positions 30 sec x 2  L's w/noodle along spine yellow TB 3 sec x 10  W's w/noodle along spine yellow TB 3 sec x 10  L's w/noodle along spine yellow TB 3 sec x 10  W's w/noodle along spine yellow TB 3 sec x 10  Manual Therapy: STM through ant/lat/post cervical musculature pt supine  Skilled palpation to assess response to manual work and DN Trigger Point Dry-Needling  Treatment instructions: Expect mild to moderate muscle soreness. S/S of pneumothorax if dry needled over a lung field, and to seek immediate medical attention should they occur. Patient verbalized understanding of these instructions and education.  Patient Consent Given: Yes Education handout provided: Previously provided Muscles treated: suboccipitals; L cervical paraspinals; scaleni; SCM Electrical stimulation  performed: No Parameters: N/A Treatment response/outcome: decreased palpable tightness   Neuromuscular re-ed: Education re- importance of upright posture with improved strength in posterior shoulder girdle musculature  Modalities: Uses heat/ice at home. Does not like TENS  Self Care: Use of noodle for sitting  Use of pillows for electronics when sitting   [facility] Adult PT Treatment:                                                DATE: 09/13/22 Therapeutic Exercise: UBE L4 x 4 min alt fwd and back every few seconds as patient decides Supine  Chin tuck 10 sec x 10  Standing  Chin tuck with noodle 5 sec x 5 Doorway stretch 3 positions 30 sec x 2  L's w/noodle along spine yellow TB 3 sec x 10  W's w/noodle along spine yellow TB 3 sec x 10  Row green TB 3 sec x 10  Shoulder extension green TB 3 sec x 10  Bow and arrow green TB 3 sec x 10  Sitting  Relaxation series 5-10 reps each exercise  Manual Therapy: STM through ant/lat/post cervical musculature pt supine  Skilled palpation to assess response to manual work  Gentle PROM/stretching into cervical flexion and lateral flexion  Modalities: Moist heat x 10 min. Uses heat/ice at home. Does not like TENS  Self Care: Education re- importance of upright posture with improved strength in posterior shoulder girdle musculature  Use of noodle for sitting  Use of pillows for electronics when sitting  [facility] Adult PT Treatment:                                                DATE: 09/11/22 Therapeutic Exercise: UBE L4 x 4 min alt fwd and back   Supine  Chin tuck 10 sec x 10  Standing  Chin tuck with noodle 5 sec x 5 Doorway stretch 3 positions 30 sec x 2  L's w/noodle along spine yellow TB 3 sec x 10  W's w/noodle along spine yellow TB 3 sec x 10  Row green TB 3 sec x 10  Shoulder extension green TB 3 sec x 10  Bow and arrow green TB 3 sec x 10  Sitting  Relaxation series 5-10 reps each exercise  Manual Therapy: STM through  ant/lat/post cervical musculature pt supine  Skilled palpation to assess response to manual work  Gentle PROM/stretching into cervical flexion and lateral flexion  Modalities: Uses heat/ice at home. Does not like TENS  Self Care: Education re- importance of upright posture with improved strength in posterior shoulder girdle musculature  Use of noodle for sitting  Use of pillows for electronics when sitting      PATIENT EDUCATION:  Education details: POC; HEP Person educated: Patient Education method: Programmer, multimedia, Demonstration, Actor cues, Verbal cues, and Handouts Education comprehension: verbalized understanding, returned demonstration, verbal cues required, tactile cues required, and needs further education  HOME EXERCISE PROGRAM: Access Code: [email or web address] URL: [email or web address] Date: 09/11/2022 Prepared by: Corlis Leak  Exercises - Supine Cervical Retraction with Towel  - 2 x daily - 7 x weekly - 1 sets - 5-10 reps - 10 sec  hold - Seated Cervical Retraction  - 2 x daily - 7 x weekly - 1-2 sets - 5-10 reps - 10 sec  hold - Seated Scapular Retraction  - 2 x daily - 7 x weekly - 1-2 sets - 10 reps - 10 sec  hold - Doorway Pec Stretch at 60 Degrees Abduction  - 3 x daily - 7 x weekly - 1 sets - 3 reps - Doorway Pec Stretch at 90 Degrees Abduction  - 3 x daily - 7 x weekly - 1 sets - 3 reps - 30 seconds  hold - Doorway Pec Stretch at 120 Degrees Abduction  - 3 x daily - 7 x weekly - 1 sets - 3 reps - 30 second hold  hold - Shoulder External Rotation and Scapular Retraction with Resistance  - 2 x daily - 7 x weekly - 1 sets - 10 reps - 3-5 sec  hold - Shoulder W - External Rotation with Resistance  - 2 x daily - 7 x weekly - 1-2 sets - 10 reps - 3 sec  hold - Standing Bilateral Low Shoulder Row with Anchored Resistance  - 2 x daily - 7 x weekly - 1-3 sets - 10 reps - 2-3 sec  hold - Shoulder extension with resistance - Neutral  - 1 x daily - 7 x weekly - 1-2 sets -  10 reps - 3-5 sec  hold - Drawing Bow  - 1 x daily - 7 x weekly - 1 sets - 10 reps - 3 sec  hold Patient Education - Trigger Point Dry Needling  ASSESSMENT:  CLINICAL IMPRESSION: Continued flare up of symptoms likely due to extensive dental work yesterday. Focus on STM and myofacial release to address increased pain and tightness. Reviewed exercises for cervical ROM and posterior shoulder girdle strengthening. Encouraged work on posture and alignment and exercises, keeping chin tucked to avoid extension at higher level cervical spine.   EVAL: Patient is a 77 y.o. female who was seen today for physical therapy evaluation and treatment for cervicalgia. She has a history of ACDF C5/6 1996 and intermittent cervical pain and dysfunction since that time. Patient reports irritation of cervical symptoms with yard work ~ 4 months ago. She has muscular tightness and pain with movement. Symptoms are worse first thing in the morning and late day. She has some headaches related to tightness in the neck. Patient presents with poor posture and alignment; limited cervical and thoracic/shoulder ROM/mobility; muscular tightness to palpation; postural weakness.  OBJECTIVE IMPAIRMENTS:  ACDF C5/6 1996 and intermittent cervical pain and dysfunction since that time. Patient reports irritation of cervical symptoms with yard work ~ 4 months ago. She has muscular tightness and pain with movement. Symptoms are worse first thing in the morning and late day. She has some headaches related to tightness in the neck. Patient presents with poor posture and alignment; limited cervical and thoracic/shoulder ROM/mobility; muscular tightness to palpation; postural weaknessdecreased activity tolerance, decreased mobility, decreased ROM, decreased strength, increased fascial restrictions, increased muscle spasms, impaired flexibility, improper body mechanics, postural dysfunction, and pain.    GOALS: Goals reviewed with patient?  Yes  SHORT TERM GOALS: Target date: 09/21/2022  Independent in initial HEP  Baseline:  Goal status: INITIAL  2.  75% decrease in frequency and intensity of headaches  Baseline:  Goal status: INITIAL   LONG TERM GOALS: Target date: 11/02/2022   Decrease cervical pain by 75-100% allowing patient to return to all normal functional activities  Baseline:  Goal status: INITIAL  2.  Increase cervical ROM to WFL's with no pain  Baseline:  Goal status: INITIAL  3.  Improve posture and alignment with patient to demonstrate improved upright posture with posterior shoulder girdle engaged  Baseline:  Goal status: INITIAL  4.  5/5 strength posterior shoulder girdle in middle and lower traps  Baseline:  Goal status: INITIAL  5.  Independent in HEP  Baseline:  Goal status: INITIAL  6.  Improve functional limitation score to 56 Baseline: 44 Goal status: INITIAL   PLAN:  PT FREQUENCY: 2x/week  PT DURATION: 12 weeks  PLANNED INTERVENTIONS: Therapeutic exercises, Therapeutic activity, Neuromuscular re-education, Balance training, Gait training, Patient/Family education, Self Care, Joint mobilization, Aquatic Therapy, Dry Needling, Electrical stimulation, Spinal mobilization, Cryotherapy, Moist heat, Taping, Ultrasound, Ionotophoresis 4mg /ml Dexamethasone, Manual therapy, and Re-evaluation  PLAN FOR NEXT SESSION: review and progress exercises program; continue with spine care and body mechanics education; manual work, DN, modalities as indicated    [employer], PT 09/19/2022, 10:22 AM

## 2022-09-21 ENCOUNTER — Telehealth: Payer: Self-pay

## 2022-09-21 DIAGNOSIS — M791 Myalgia, unspecified site: Secondary | ICD-10-CM

## 2022-09-21 DIAGNOSIS — E7800 Pure hypercholesterolemia, unspecified: Secondary | ICD-10-CM

## 2022-09-21 NOTE — Telephone Encounter (Signed)
-----   Message from Kristeen Miss sent at 09/19/2022  6:09 PM EDT ----- She had carotid artery disease  Her goal LDL is < 70 I would like for her to see the lipid clinic for suggestions.  She has been intolerant to statins in the past .

## 2022-09-21 NOTE — Telephone Encounter (Signed)
Pt has viewed physician comments via MyChart. Order for lipid clinic referral placed at this time.

## 2022-10-02 ENCOUNTER — Ambulatory Visit: Payer: Medicare Other | Admitting: Cardiovascular Disease

## 2022-10-02 ENCOUNTER — Ambulatory Visit: Payer: Medicare Other | Attending: Neurological Surgery | Admitting: Rehabilitative and Restorative Service Providers"

## 2022-10-02 ENCOUNTER — Encounter: Payer: Self-pay | Admitting: Rehabilitative and Restorative Service Providers"

## 2022-10-02 ENCOUNTER — Telehealth: Payer: Self-pay | Admitting: Cardiovascular Disease

## 2022-10-02 DIAGNOSIS — M542 Cervicalgia: Secondary | ICD-10-CM | POA: Diagnosis not present

## 2022-10-02 DIAGNOSIS — R29898 Other symptoms and signs involving the musculoskeletal system: Secondary | ICD-10-CM | POA: Insufficient documentation

## 2022-10-02 DIAGNOSIS — M6281 Muscle weakness (generalized): Secondary | ICD-10-CM | POA: Diagnosis not present

## 2022-10-02 DIAGNOSIS — R293 Abnormal posture: Secondary | ICD-10-CM | POA: Insufficient documentation

## 2022-10-02 NOTE — Therapy (Signed)
OUTPATIENT PHYSICAL THERAPY CERVICAL TREATMENT and  MEDICARE 10th VISIT NOTE   Progress Note Reporting Period 08/10/22 to 10/02/22  See note below for Objective Data and Assessment of Progress/Goals.     Patient Name: Wendy [name] MRN: [id number] DOB:February 08, 1946, 77 y.o., female Today's Date: 10/02/2022  END OF SESSION:  PT End of Session - 10/02/22 1105     Visit Number 10    Number of Visits 24    Date for PT Re-Evaluation 11/02/22    Authorization Type medicare + supplemental    Progress Note Due on Visit 10    PT Start Time 1100    PT Stop Time 1150    PT Time Calculation (min) 50 min    Activity Tolerance Patient tolerated treatment well             Past Medical History:  Diagnosis Date   Anxiety    Atypical chest pain    B12 deficiency    Cervical spondylosis without myelopathy    Diverticulosis of colon    Dysesthesia    GERD (gastroesophageal reflux disease)    Hypercholesteremia    Hypertension    IBS (irritable bowel syndrome)    Malignant melanoma (HCC)    Vitreous hemorrhage (HCC)    Past Surgical History:  Procedure Laterality Date   anterior cervical discectomy and fusion  1996   Dr. 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   Chronic cystitis 01/18/2022   Degeneration of lumbar intervertebral disc 01/18/2022   Lumbar spondylosis 01/18/2022   Lumbar radiculopathy 01/18/2022   Constipation 01/18/2022   Myalgia due to statin 01/18/2022   Aortic atherosclerosis (HCC) 01/18/2022   Hyponatremia 04/30/2021   Weakness  04/30/2021   COVID-19 virus infection 04/30/2021   Pain in wrist 01/16/2019   Neck pain 01/10/2019   Impacted cerumen of both ears 08/07/2018   Eustachian tube dysfunction, bilateral 08/07/2018   Mixed hyperlipidemia 03/30/2016   Vaginitis, atrophic 05/26/2015   Carotid artery disease (HCC) 02/15/2015   Voiding dysfunction 12/11/2014   Third degree hemorrhoids 10/13/2014   Procidentia of uterus 09/23/2014   Occlusion and stenosis of carotid artery without mention of cerebral infarction 09/02/2013   Bilateral dry eyes 07/14/2012   Actinic keratoses 05/25/2012   Nuclear cataract, nonsenile 12/25/2011   Other seborrheic keratosis 09/18/2011   History of malignant melanoma of skin 05/11/2011   OBSTRUCTIVE SLEEP APNEA 11/19/2009   SNORING 10/11/2009   Abnormal respiratory rate 10/11/2009   LACERATION OF FINGER 09/03/2009   DYSESTHESIA 05/23/2008   MALIGNANT MELANOMA 01/13/2008   ANXIETY 01/13/2008   VITREOUS HEMORRHAGE 01/13/2008   HTN (hypertension) 01/13/2008   DIVERTICULOSIS OF COLON 01/13/2008   IRRITABLE BOWEL SYNDROME 01/13/2008   CERVICAL SPONDYLOSIS WITHOUT MYELOPATHY 01/13/2008   CHEST PAIN, ATYPICAL 01/13/2008   HYPERCHOLESTEROLEMIA 01/10/2008   GERD 01/10/2008    PCP: Dr Adrian Prince  REFERRING PROVIDER: Dr Barnett Abu   REFERRING DIAG: Cervicalgia  THERAPY DIAG:  Cervicalgia  Other symptoms and signs involving the musculoskeletal system  Muscle weakness (  generalized)  Abnormal posture  Rationale for Evaluation and Treatment: Rehabilitation  ONSET DATE: 04/13/22  SUBJECTIVE:                                                                                                                                                                                                         SUBJECTIVE STATEMENT: Patient reports that she is improving. She has less pain and tightness in the neck. She feels that the needling helped a lot last time. Neck feels stiff. Awoke  this mornig with less discomfort.   EVAL: increase in neck pain following yard work ~ 3-4 months ago. She is better with Advil but it is worse in the evening. She has some degenerative changes in the cervical spine.  Hand dominance: Right  PERTINENT HISTORY:  HTN; cervical sx with fusion 1996; arthritis; melanoma   PAIN:  Are you having pain? Yes: NPRS scale: 3/10; gets up to 7-8/10 Pain location: posterior cervical spine  Pain description: tightness; nagging; into head; stiffness  Aggravating factors: worse first thing in the morning and toward the end of the day; activities  Relieving factors: OTC antiinflammatory; moving   PRECAUTIONS: None  WEIGHT BEARING RESTRICTIONS: No  FALLS:  Has patient fallen in last 6 months? 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 Cervical spondylosis, slightly progressed at the C3-4 and C4-5 levels. 2. Mild-moderate canal stenosis at C4-5 with moderate right foraminal stenosis. 3. Mild-moderate right foraminal stenosis at C3-4. 4. Prior C5-C7 ACDF with solid arthrodesis.  PATIENT SURVEYS:  FOTO 44; goal 56 10/02/22: 53  SENSATION: Intermittent tingling in Rt hand   POSTURE: Patient presents with head forward posture with increased thoracic kyphosis; shoulders rounded and elevated; scapulae abducted and rotated along the thoracic spine; head of the humerus anterior in orientation.   PALPATION: Muscular tightness in ant/lat/post cervical musculature; pecs; upper trap; leveator  10/02/22: persistent tightness deeper layers of cervical musculature into occipital area   CERVICAL ROM:   Active ROM A/PROM (deg) eval AROM 10/02/2022   Flexion 41 42  Extension 40 tight; pain 49 mild tightness  Right lateral flexion 25 tight  22 tight  Left lateral flexion 26 tight 26 tight  Right rotation 51 tight 56 tight  Left rotation 27 tight  48 tight    (Blank rows = not tested)  UPPER EXTREMITY STRENGTH: no pain with resistive testing   Active ROM Right eval Left eval  Shoulder flexion 5 5  Shoulder extension 5 5  Shoulder abduction 5 5  Shoulder adduction    Shoulder extension    Shoulder internal rotation    Shoulder external rotation    Middle trap  4 4  Lower trap  4 4  Wrist flexion    Wrist extension    Wrist ulnar deviation    Wrist radial deviation    Wrist pronation    Wrist supination     (Blank rows = not tested)  UPPER EXTREMITY ROM: end range tightness as noted   MMT Right eval Left eval  Shoulder flexion Tight  Tight   Shoulder extension    Shoulder abduction Tight  Tight   Shoulder adduction    Shoulder extension    Shoulder internal rotation Tight  Tight   Shoulder external rotation    Middle trapezius    Lower trapezius    Elbow flexion    Elbow extension    Wrist flexion    Wrist extension    Wrist ulnar deviation    Wrist radial deviation    Wrist pronation    Wrist supination    Grip strength     (Blank rows = not tested)  CERVICAL SPECIAL TESTS:  Upper limb tension test (ULTT): Positive - tight R > L   [facility] Adult PT Treatment:    (dry needling)       DATE: 10/02/22 Therapeutic Exercise: Supine  Chin tuck 10 sec x 10  Standing  UBE L4 x4 in alt fwd/back  Doorway stretch 3 positions 30 sec x 2  L's w/noodle along spine yellow TB 3 sec x 10  W's w/noodle along spine yellow TB 3 sec x 10  Sitting  Chin tuck 5 sec x 5 Lateral cervical flexion 5 sec x 5 R/L Cervical rotation 3 sec x 5 R/L  Manual Therapy: STM through ant/lat/post cervical musculature pt supine  Skilled palpation to assess response to manual work and DN Trigger Point Dry-Needling  Treatment  instructions: Expect mild to moderate muscle soreness. S/S of pneumothorax if dry needled over a lung field, and to seek immediate medical attention should they occur. Patient verbalized understanding of these instructions and education.  Patient Consent Given: Yes Education handout provided: Previously provided Muscles treated: suboccipitals; L cervical paraspinals; scaleni; SCM Electrical stimulation performed: No Parameters: N/A Treatment response/outcome: decreased palpable tightness   Neuromuscular re-ed: Education re- importance of upright posture with improved strength in posterior shoulder girdle musculature  Modalities: Moist heat cervical spine, ice LB  Does not like TENS  Self Care: Use of noodle for sitting  Use of pillows for electronics when sitting  [facility] Adult PT Treatment:    (dry needling)       DATE: 09/19/22 Therapeutic Exercise: Supine  Chin tuck 10 sec x 10  Standing  Doorway stretch 3 positions 30 sec x 2  L's w/noodle along spine yellow TB 3 sec x 10  W's w/noodle along spine yellow TB 3 sec x 10  L's w/noodle along spine yellow TB 3 sec x 10  W's w/noodle along spine yellow TB 3 sec x 10  Manual Therapy: STM through ant/lat/post cervical musculature pt supine  Skilled palpation to assess response to manual work and DN Psychiatrist  Treatment instructions: Expect  mild to moderate muscle soreness. S/S of pneumothorax if dry needled over a lung field, and to seek immediate medical attention should they occur. Patient verbalized understanding of these instructions and education.  Patient Consent Given: Yes Education handout provided: Previously provided Muscles treated: suboccipitals; L cervical paraspinals; scaleni; SCM Electrical stimulation performed: No Parameters: N/A Treatment response/outcome: decreased palpable tightness   Neuromuscular re-ed: Education re- importance of upright posture with improved strength in posterior shoulder  girdle musculature  Modalities: Uses heat/ice at home. Does not like TENS  Self Care: Use of noodle for sitting  Use of pillows for electronics when sitting   [facility] Adult PT Treatment:                                                DATE: 09/13/22 Therapeutic Exercise: UBE L4 x 4 min alt fwd and back every few seconds as patient decides Supine  Chin tuck 10 sec x 10  Standing  Chin tuck with noodle 5 sec x 5 Doorway stretch 3 positions 30 sec x 2  L's w/noodle along spine yellow TB 3 sec x 10  W's w/noodle along spine yellow TB 3 sec x 10  Row green TB 3 sec x 10  Shoulder extension green TB 3 sec x 10  Bow and arrow green TB 3 sec x 10  Sitting  Relaxation series 5-10 reps each exercise  Manual Therapy: STM through ant/lat/post cervical musculature pt supine  Skilled palpation to assess response to manual work  Gentle PROM/stretching into cervical flexion and lateral flexion  Modalities: Moist heat x 10 min. Uses heat/ice at home. Does not like TENS  Self Care: Education re- importance of upright posture with improved strength in posterior shoulder girdle musculature  Use of noodle for sitting  Use of pillows for electronics when sitting   PATIENT EDUCATION:  Education details: POC; HEP Person educated: Patient Education method: Programmer, multimedia, Demonstration, Actor cues, Verbal cues, and Handouts Education comprehension: verbalized understanding, returned demonstration, verbal cues required, tactile cues required, and needs further education  HOME EXERCISE PROGRAM: Access Code: [email or web address] URL: [email or web address] Date: 09/11/2022 Prepared by: Corlis Leak  Exercises - Supine Cervical Retraction with Towel  - 2 x daily - 7 x weekly - 1 sets - 5-10 reps - 10 sec  hold - Seated Cervical Retraction  - 2 x daily - 7 x weekly - 1-2 sets - 5-10 reps - 10 sec  hold - Seated Scapular Retraction  - 2 x daily - 7 x weekly - 1-2 sets - 10 reps - 10 sec  hold - Doorway  Pec Stretch at 60 Degrees Abduction  - 3 x daily - 7 x weekly - 1 sets - 3 reps - Doorway Pec Stretch at 90 Degrees Abduction  - 3 x daily - 7 x weekly - 1 sets - 3 reps - 30 seconds  hold - Doorway Pec Stretch at 120 Degrees Abduction  - 3 x daily - 7 x weekly - 1 sets - 3 reps - 30 second hold  hold - Shoulder External Rotation and Scapular Retraction with Resistance  - 2 x daily - 7 x weekly - 1 sets - 10 reps - 3-5 sec  hold - Shoulder W - External Rotation with Resistance  - 2 x daily - 7 x weekly - 1-2 sets - 10 reps -  3 sec  hold - Standing Bilateral Low Shoulder Row with Anchored Resistance  - 2 x daily - 7 x weekly - 1-3 sets - 10 reps - 2-3 sec  hold - Shoulder extension with resistance - Neutral  - 1 x daily - 7 x weekly - 1-2 sets - 10 reps - 3-5 sec  hold - Drawing Bow  - 1 x daily - 7 x weekly - 1 sets - 10 reps - 3 sec  hold Patient Education - Trigger Point Dry Needling  ASSESSMENT:  CLINICAL IMPRESSION: Improving symptoms with decreased tightness and discomfort. Patient demonstrates some increasing cervical ROM with the exception of lateral cervical flexion which remains the most limited cervical range and continues to feel tight. Focus on STM and myofacial release to address increased pain and tightness as well as exercises for cervical ROM and posterior shoulder girdle strengthening. Encouraged work on posture and alignment and exercises, keeping chin tucked to avoid extension at higher level cervical spine. Goals are partially accomplished. Patient will benefit from continued treatment to achieve maximum rehab potential.   EVAL: Patient is a 77 y.o. female who was seen today for physical therapy evaluation and treatment for cervicalgia. She has a history of ACDF C5/6 1996 and intermittent cervical pain and dysfunction since that time. Patient reports irritation of cervical symptoms with yard work ~ 4 months ago. She has muscular tightness and pain with movement. Symptoms are worse  first thing in the morning and late day. She has some headaches related to tightness in the neck. Patient presents with poor posture and alignment; limited cervical and thoracic/shoulder ROM/mobility; muscular tightness to palpation; postural weakness.  OBJECTIVE IMPAIRMENTS:  ACDF C5/6 1996 and intermittent cervical pain and dysfunction since that time. Patient reports irritation of cervical symptoms with yard work ~ 4 months ago. She has muscular tightness and pain with movement. Symptoms are worse first thing in the morning and late day. She has some headaches related to tightness in the neck. Patient presents with poor posture and alignment; limited cervical and thoracic/shoulder ROM/mobility; muscular tightness to palpation; postural weaknessdecreased activity tolerance, decreased mobility, decreased ROM, decreased strength, increased fascial restrictions, increased muscle spasms, impaired flexibility, improper body mechanics, postural dysfunction, and pain.    GOALS: Goals reviewed with patient? Yes  SHORT TERM GOALS: Target date: 09/21/2022  Independent in initial HEP  Baseline:  Goal status: met  2.  75% decrease in frequency and intensity of headaches  Baseline:  Goal status: met   LONG TERM GOALS: Target date: 11/02/2022   Decrease cervical pain by 75-100% allowing patient to return to all normal functional activities  Baseline:  Goal status: on going   2.  Increase cervical ROM to WFL's with no pain  Baseline:  Goal status: on going   3.  Improve posture and alignment with patient to demonstrate improved upright posture with posterior shoulder girdle engaged  Baseline:  Goal status: on going   4.  5/5 strength posterior shoulder girdle in middle and lower traps  Baseline:  Goal status: on going   5.  Independent in HEP  Baseline:  Goal status:on going   6.  Improve functional limitation score to 56 Baseline: 44 10/02/22: 53 Goal status:on going    PLAN:  PT  FREQUENCY: 2x/week  PT DURATION: 12 weeks  PLANNED INTERVENTIONS: Therapeutic exercises, Therapeutic activity, Neuromuscular re-education, Balance training, Gait training, Patient/Family education, Self Care, Joint mobilization, Aquatic Therapy, Dry Needling, Electrical stimulation, Spinal mobilization, Cryotherapy, Moist heat, Taping,  Ultrasound, Ionotophoresis 4mg /ml Dexamethasone, Manual therapy, and Re-evaluation  PLAN FOR NEXT SESSION: review and progress exercises program; continue with spine care and body mechanics education; manual work, DN, modalities as indicated    [employer], PT 10/02/2022, 11:05 AM

## 2022-10-02 NOTE — Telephone Encounter (Signed)
Pt is requesting a callback regarding her having labs done before her office visit appt. Please advise

## 2022-10-02 NOTE — Telephone Encounter (Signed)
Returned call to patient to inform her that no labs are needed at this time by Dr Elease Hashimoto, however, PharmD has her on Leqvio and wanted her to do repeat labs around the first week of August to determine medication effectiveness. She understands to get lipids drawn prior to 10/27/22 visit.

## 2022-10-04 ENCOUNTER — Encounter: Payer: Self-pay | Admitting: Rehabilitative and Restorative Service Providers"

## 2022-10-04 ENCOUNTER — Ambulatory Visit: Payer: Medicare Other | Admitting: Rehabilitative and Restorative Service Providers"

## 2022-10-04 DIAGNOSIS — R29898 Other symptoms and signs involving the musculoskeletal system: Secondary | ICD-10-CM

## 2022-10-04 DIAGNOSIS — M6281 Muscle weakness (generalized): Secondary | ICD-10-CM

## 2022-10-04 DIAGNOSIS — R293 Abnormal posture: Secondary | ICD-10-CM

## 2022-10-04 DIAGNOSIS — M542 Cervicalgia: Secondary | ICD-10-CM | POA: Diagnosis not present

## 2022-10-04 NOTE — Therapy (Signed)
OUTPATIENT PHYSICAL THERAPY CERVICAL TREATMENT       Patient Name: Wendy [name] MRN: [id number] DOB:1945-08-28, 77 y.o., female Today's Date: 10/04/2022  END OF SESSION:  PT End of Session - 10/04/22 1455     Visit Number 11    Number of Visits 24    Date for PT Re-Evaluation 11/02/22    Authorization Type medicare + supplemental    Progress Note Due on Visit 20    PT Start Time 1450    PT Stop Time 1540    PT Time Calculation (min) 50 min    Activity Tolerance Patient tolerated treatment well             Past Medical History:  Diagnosis Date   Anxiety    Atypical chest pain    B12 deficiency    Cervical spondylosis without myelopathy    Diverticulosis of colon    Dysesthesia    GERD (gastroesophageal reflux disease)    Hypercholesteremia    Hypertension    IBS (irritable bowel syndrome)    Malignant melanoma (HCC)    Vitreous hemorrhage (HCC)    Past Surgical History:  Procedure Laterality Date   anterior cervical discectomy and fusion  1996   Dr. 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   Chronic cystitis 01/18/2022   Degeneration of lumbar intervertebral disc 01/18/2022   Lumbar spondylosis 01/18/2022   Lumbar radiculopathy 01/18/2022   Constipation 01/18/2022   Myalgia due to statin 01/18/2022   Aortic atherosclerosis (HCC) 01/18/2022   Hyponatremia 04/30/2021   Weakness 04/30/2021   COVID-19 virus infection 04/30/2021   Pain in wrist 01/16/2019   Neck pain 01/10/2019   Impacted cerumen of both ears 08/07/2018    Eustachian tube dysfunction, bilateral 08/07/2018   Mixed hyperlipidemia 03/30/2016   Vaginitis, atrophic 05/26/2015   Carotid artery disease (HCC) 02/15/2015   Voiding dysfunction 12/11/2014   Third degree hemorrhoids 10/13/2014   Procidentia of uterus 09/23/2014   Occlusion and stenosis of carotid artery without mention of cerebral infarction 09/02/2013   Bilateral dry eyes 07/14/2012   Actinic keratoses 05/25/2012   Nuclear cataract, nonsenile 12/25/2011   Other seborrheic keratosis 09/18/2011   History of malignant melanoma of skin 05/11/2011   OBSTRUCTIVE SLEEP APNEA 11/19/2009   SNORING 10/11/2009   Abnormal respiratory rate 10/11/2009   LACERATION OF FINGER 09/03/2009   DYSESTHESIA 05/23/2008   MALIGNANT MELANOMA 01/13/2008   ANXIETY 01/13/2008   VITREOUS HEMORRHAGE 01/13/2008   HTN (hypertension) 01/13/2008   DIVERTICULOSIS OF COLON 01/13/2008   IRRITABLE BOWEL SYNDROME 01/13/2008   CERVICAL SPONDYLOSIS WITHOUT MYELOPATHY 01/13/2008   CHEST PAIN, ATYPICAL 01/13/2008   HYPERCHOLESTEROLEMIA 01/10/2008   GERD 01/10/2008    PCP: Dr Adrian Prince  REFERRING PROVIDER: Dr Barnett Abu   REFERRING DIAG: Cervicalgia  THERAPY DIAG:  Cervicalgia  Other symptoms and signs involving the musculoskeletal system  Muscle weakness (generalized)  Abnormal posture  Rationale for Evaluation and Treatment: Rehabilitation  ONSET DATE: 04/13/22  SUBJECTIVE:  SUBJECTIVE STATEMENT: Patient reports that she has some stiffness in the neck which is improving overall. She feels that the needling helped a lot last time.    EVAL: increase in neck pain following yard work ~ 3-4 months ago. She is better with Advil but it is worse in the evening. She has some degenerative changes in the  cervical spine.  Hand dominance: Right  PERTINENT HISTORY:  HTN; cervical sx with fusion 1996; arthritis; melanoma   PAIN:  Are you having pain? Yes: NPRS scale: 4/10; gets up to 7-8/10 Pain location: posterior cervical spine  Pain description: tightness; nagging; into head; stiffness  Aggravating factors: worse first thing in the morning and toward the end of the day; activities  Relieving factors: OTC antiinflammatory; moving   PRECAUTIONS: None  WEIGHT BEARING RESTRICTIONS: No  FALLS:  Has patient fallen in last 6 months? 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 Cervical spondylosis, slightly progressed at the C3-4 and C4-5 levels. 2. Mild-moderate canal stenosis at C4-5 with moderate right foraminal stenosis. 3. Mild-moderate right foraminal stenosis at C3-4. 4. Prior C5-C7 ACDF with solid arthrodesis.  PATIENT SURVEYS:  FOTO 44; goal 56 10/02/22: 53  SENSATION: Intermittent tingling in Rt hand   POSTURE: Patient presents with head forward posture with increased thoracic kyphosis; shoulders rounded and elevated; scapulae abducted and rotated along the thoracic spine; head of the humerus anterior in orientation.   PALPATION: Muscular tightness in ant/lat/post cervical musculature; pecs; upper trap; leveator  10/02/22: persistent tightness deeper layers of cervical musculature into occipital area   CERVICAL ROM:   Active ROM A/PROM (deg) eval AROM 10/02/2022  Flexion 41 42  Extension 40 tight; pain 49 mild tightness  Right lateral flexion 25 tight  22 tight  Left lateral flexion 26 tight 26 tight  Right rotation 51 tight 56 tight   Left rotation 27 tight  48  tight    (Blank rows = not tested)  UPPER EXTREMITY STRENGTH: no pain with resistive testing   Active ROM Right eval Left eval  Shoulder flexion 5 5  Shoulder extension 5 5  Shoulder abduction 5 5  Shoulder adduction    Shoulder extension    Shoulder internal rotation    Shoulder external rotation    Middle trap  4 4  Lower trap  4 4  Wrist flexion    Wrist extension    Wrist ulnar deviation    Wrist radial deviation    Wrist pronation    Wrist supination     (Blank rows = not tested)  UPPER EXTREMITY ROM: end range tightness as noted   MMT Right eval Left eval  Shoulder flexion Tight  Tight   Shoulder extension    Shoulder abduction Tight  Tight   Shoulder adduction    Shoulder extension    Shoulder internal rotation Tight  Tight   Shoulder external rotation    Middle trapezius    Lower trapezius    Elbow flexion    Elbow extension    Wrist flexion    Wrist extension    Wrist ulnar deviation    Wrist radial deviation    Wrist pronation  Wrist supination    Grip strength     (Blank rows = not tested)  CERVICAL SPECIAL TESTS:  Upper limb tension test (ULTT): Positive - tight R > L   [facility] Adult PT Treatment:    (dry needling)       DATE: 10/04/22 Therapeutic Exercise: Supine  Chin tuck 10 sec x 10  Standing  UBE L4 x4 in alt fwd/back  Doorway stretch 3 positions 30 sec x 2  L's w/noodle along spine yellow TB 3 sec x 10  W's w/noodle along spine yellow TB 3 sec x 10  Sitting  Chin tuck 5 sec x 5 Lateral cervical flexion 5 sec x 5 R/L Cervical rotation 3 sec x 5 R/L  Manual Therapy: STM through ant/lat/post cervical musculature pt supine  Skilled palpation to assess response to manual work and DN Trigger Point Dry-Needling  Treatment instructions: Expect mild to moderate muscle soreness. S/S of pneumothorax if dry needled over a lung field, and to seek immediate medical attention should they occur. Patient verbalized understanding of these  instructions and education.  Patient Consent Given: Yes Education handout provided: Previously provided Muscles treated: suboccipitals; Bilat cervical paraspinals; scaleni  Electrical stimulation performed: No Parameters: N/A Treatment response/outcome: decreased palpable tightness   Neuromuscular re-ed: Education re- importance of upright posture with improved strength in posterior shoulder girdle musculature  Modalities: Moist heat cervical spine, ice LB  Does not like TENS  Self Care: Use of noodle for sitting  Use of pillows for electronics when sitting   [facility] Adult PT Treatment:    (dry needling)       DATE: 10/02/22 Therapeutic Exercise: Supine  Chin tuck 10 sec x 10  Standing  UBE L4 x4 in alt fwd/back  Doorway stretch 3 positions 30 sec x 2  L's w/noodle along spine yellow TB 3 sec x 10  W's w/noodle along spine yellow TB 3 sec x 10  Sitting  Chin tuck 5 sec x 5 Lateral cervical flexion 5 sec x 5 R/L Cervical rotation 3 sec x 5 R/L  Manual Therapy: STM through ant/lat/post cervical musculature pt supine  Skilled palpation to assess response to manual work and DN Trigger Point Dry-Needling  Treatment instructions: Expect mild to moderate muscle soreness. S/S of pneumothorax if dry needled over a lung field, and to seek immediate medical attention should they occur. Patient verbalized understanding of these instructions and education.  Patient Consent Given: Yes Education handout provided: Previously provided Muscles treated: suboccipitals; L cervical paraspinals; scaleni; SCM Electrical stimulation performed: No Parameters: N/A Treatment response/outcome: decreased palpable tightness   Neuromuscular re-ed: Education re- importance of upright posture with improved strength in posterior shoulder girdle musculature  Modalities: Moist heat cervical spine, ice LB  Does not like TENS  Self Care: Use of noodle for sitting  Use of pillows for electronics when  sitting  [facility] Adult PT Treatment:    (dry needling)       DATE: 09/19/22 Therapeutic Exercise: Supine  Chin tuck 10 sec x 10  Standing  Doorway stretch 3 positions 30 sec x 2  L's w/noodle along spine yellow TB 3 sec x 10  W's w/noodle along spine yellow TB 3 sec x 10  L's w/noodle along spine yellow TB 3 sec x 10  W's w/noodle along spine yellow TB 3 sec x 10  Manual Therapy: STM through ant/lat/post cervical musculature pt supine  Skilled palpation to assess response to manual work and DN Psychiatrist  Treatment instructions: Expect mild to moderate muscle soreness. S/S of pneumothorax if dry needled over a lung field, and to seek immediate medical attention should they occur. Patient verbalized understanding of these instructions and education.  Patient Consent Given: Yes Education handout provided: Previously provided Muscles treated: suboccipitals; L cervical paraspinals; scaleni; SCM Electrical stimulation performed: No Parameters: N/A Treatment response/outcome: decreased palpable tightness   Neuromuscular re-ed: Education re- importance of upright posture with improved strength in posterior shoulder girdle musculature  Modalities: Uses heat/ice at home. Does not like TENS  Self Care: Use of noodle for sitting  Use of pillows for electronics when sitting   PATIENT EDUCATION:  Education details: POC; HEP Person educated: Patient Education method: Programmer, multimedia, Demonstration, Tactile cues, Verbal cues, and Handouts Education comprehension: verbalized understanding, returned demonstration, verbal cues required, tactile cues required, and needs further education  HOME EXERCISE PROGRAM: Access Code: [email or web address] URL: [email or web address] Date: 09/11/2022 Prepared by: Corlis Leak  Exercises - Supine Cervical Retraction with Towel  - 2 x daily - 7 x weekly - 1 sets - 5-10 reps - 10 sec  hold - Seated Cervical Retraction  - 2 x daily - 7 x weekly -  1-2 sets - 5-10 reps - 10 sec  hold - Seated Scapular Retraction  - 2 x daily - 7 x weekly - 1-2 sets - 10 reps - 10 sec  hold - Doorway Pec Stretch at 60 Degrees Abduction  - 3 x daily - 7 x weekly - 1 sets - 3 reps - Doorway Pec Stretch at 90 Degrees Abduction  - 3 x daily - 7 x weekly - 1 sets - 3 reps - 30 seconds  hold - Doorway Pec Stretch at 120 Degrees Abduction  - 3 x daily - 7 x weekly - 1 sets - 3 reps - 30 second hold  hold - Shoulder External Rotation and Scapular Retraction with Resistance  - 2 x daily - 7 x weekly - 1 sets - 10 reps - 3-5 sec  hold - Shoulder W - External Rotation with Resistance  - 2 x daily - 7 x weekly - 1-2 sets - 10 reps - 3 sec  hold - Standing Bilateral Low Shoulder Row with Anchored Resistance  - 2 x daily - 7 x weekly - 1-3 sets - 10 reps - 2-3 sec  hold - Shoulder extension with resistance - Neutral  - 1 x daily - 7 x weekly - 1-2 sets - 10 reps - 3-5 sec  hold - Drawing Bow  - 1 x daily - 7 x weekly - 1 sets - 10 reps - 3 sec  hold Patient Education - Trigger Point Dry Needling  ASSESSMENT:  CLINICAL IMPRESSION: Decreased palpable tightness through the posterior cervical musculature. Improving symptoms with decreased tightness and discomfort. Patient demonstrates some increasing cervical ROM with the exception of lateral cervical flexion which remains the most limited cervical range and continues to feel tight. Focus on STM and myofacial release to address increased pain and tightness as well as exercises for cervical ROM and posterior shoulder girdle strengthening. Encouraged work on posture and alignment and exercises, keeping chin tucked to avoid extension at higher level cervical spine.   EVAL: Patient is a 77 y.o. female who was seen today for physical therapy evaluation and treatment for cervicalgia. She has a history of ACDF C5/6 1996 and intermittent cervical pain and dysfunction since that time. Patient reports irritation of cervical symptoms with  yard work ~ 4 months  ago. She has muscular tightness and pain with movement. Symptoms are worse first thing in the morning and late day. She has some headaches related to tightness in the neck. Patient presents with poor posture and alignment; limited cervical and thoracic/shoulder ROM/mobility; muscular tightness to palpation; postural weakness.  OBJECTIVE IMPAIRMENTS:  ACDF C5/6 1996 and intermittent cervical pain and dysfunction since that time. Patient reports irritation of cervical symptoms with yard work ~ 4 months ago. She has muscular tightness and pain with movement. Symptoms are worse first thing in the morning and late day. She has some headaches related to tightness in the neck. Patient presents with poor posture and alignment; limited cervical and thoracic/shoulder ROM/mobility; muscular tightness to palpation; postural weaknessdecreased activity tolerance, decreased mobility, decreased ROM, decreased strength, increased fascial restrictions, increased muscle spasms, impaired flexibility, improper body mechanics, postural dysfunction, and pain.    GOALS: Goals reviewed with patient? Yes  SHORT TERM GOALS: Target date: 09/21/2022  Independent in initial HEP  Baseline:  Goal status: met  2.  75% decrease in frequency and intensity of headaches  Baseline:  Goal status: met   LONG TERM GOALS: Target date: 11/02/2022   Decrease cervical pain by 75-100% allowing patient to return to all normal functional activities  Baseline:  Goal status: on going   2.  Increase cervical ROM to WFL's with no pain  Baseline:  Goal status: on going   3.  Improve posture and alignment with patient to demonstrate improved upright posture with posterior shoulder girdle engaged  Baseline:  Goal status: on going   4.  5/5 strength posterior shoulder girdle in middle and lower traps  Baseline:  Goal status: on going   5.  Independent in HEP  Baseline:  Goal status:on going   6.  Improve  functional limitation score to 56 Baseline: 44 10/02/22: 53 Goal status:on going    PLAN:  PT FREQUENCY: 2x/week  PT DURATION: 12 weeks  PLANNED INTERVENTIONS: Therapeutic exercises, Therapeutic activity, Neuromuscular re-education, Balance training, Gait training, Patient/Family education, Self Care, Joint mobilization, Aquatic Therapy, Dry Needling, Electrical stimulation, Spinal mobilization, Cryotherapy, Moist heat, Taping, Ultrasound, Ionotophoresis 4mg /ml Dexamethasone, Manual therapy, and Re-evaluation  PLAN FOR NEXT SESSION: review and progress exercises program; continue with spine care and body mechanics education; manual work, DN, modalities as indicated    [employer], PT 10/04/2022, 2:55 PM

## 2022-10-05 ENCOUNTER — Ambulatory Visit: Payer: Medicare Other | Admitting: Rehabilitative and Restorative Service Providers"

## 2022-10-09 ENCOUNTER — Ambulatory Visit: Payer: Medicare Other | Admitting: Rehabilitative and Restorative Service Providers"

## 2022-10-09 ENCOUNTER — Encounter: Payer: Self-pay | Admitting: Rehabilitative and Restorative Service Providers"

## 2022-10-09 DIAGNOSIS — R293 Abnormal posture: Secondary | ICD-10-CM | POA: Diagnosis not present

## 2022-10-09 DIAGNOSIS — R29898 Other symptoms and signs involving the musculoskeletal system: Secondary | ICD-10-CM

## 2022-10-09 DIAGNOSIS — M6281 Muscle weakness (generalized): Secondary | ICD-10-CM

## 2022-10-09 DIAGNOSIS — M542 Cervicalgia: Secondary | ICD-10-CM | POA: Diagnosis not present

## 2022-10-09 NOTE — Therapy (Signed)
OUTPATIENT PHYSICAL THERAPY CERVICAL TREATMENT       Patient Name: Wendy [name] MRN: [id number] DOB:10-03-1945, 77 y.o., female Today's Date: 10/09/2022  END OF SESSION:  PT End of Session - 10/09/22 1100     Visit Number 12    Number of Visits 24    Date for PT Re-Evaluation 11/02/22    Authorization Type medicare + supplemental    Progress Note Due on Visit 20    PT Start Time 1059    PT Stop Time 1147    PT Time Calculation (min) 48 min    Activity Tolerance Patient tolerated treatment well             Past Medical History:  Diagnosis Date   Anxiety    Atypical chest pain    B12 deficiency    Cervical spondylosis without myelopathy    Diverticulosis of colon    Dysesthesia    GERD (gastroesophageal reflux disease)    Hypercholesteremia    Hypertension    IBS (irritable bowel syndrome)    Malignant melanoma (HCC)    Vitreous hemorrhage (HCC)    Past Surgical History:  Procedure Laterality Date   anterior cervical discectomy and fusion  1996   Dr. 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   Chronic cystitis 01/18/2022   Degeneration of lumbar intervertebral disc 01/18/2022   Lumbar spondylosis 01/18/2022   Lumbar radiculopathy 01/18/2022   Constipation 01/18/2022   Myalgia due to statin 01/18/2022   Aortic atherosclerosis (HCC) 01/18/2022   Hyponatremia 04/30/2021   Weakness 04/30/2021   COVID-19 virus infection 04/30/2021   Pain in wrist 01/16/2019   Neck pain 01/10/2019   Impacted cerumen of both ears 08/07/2018    Eustachian tube dysfunction, bilateral 08/07/2018   Mixed hyperlipidemia 03/30/2016   Vaginitis, atrophic 05/26/2015   Carotid artery disease (HCC) 02/15/2015   Voiding dysfunction 12/11/2014   Third degree hemorrhoids 10/13/2014   Procidentia of uterus 09/23/2014   Occlusion and stenosis of carotid artery without mention of cerebral infarction 09/02/2013   Bilateral dry eyes 07/14/2012   Actinic keratoses 05/25/2012   Nuclear cataract, nonsenile 12/25/2011   Other seborrheic keratosis 09/18/2011   History of malignant melanoma of skin 05/11/2011   OBSTRUCTIVE SLEEP APNEA 11/19/2009   SNORING 10/11/2009   Abnormal respiratory rate 10/11/2009   LACERATION OF FINGER 09/03/2009   DYSESTHESIA 05/23/2008   MALIGNANT MELANOMA 01/13/2008   ANXIETY 01/13/2008   VITREOUS HEMORRHAGE 01/13/2008   HTN (hypertension) 01/13/2008   DIVERTICULOSIS OF COLON 01/13/2008   IRRITABLE BOWEL SYNDROME 01/13/2008   CERVICAL SPONDYLOSIS WITHOUT MYELOPATHY 01/13/2008   CHEST PAIN, ATYPICAL 01/13/2008   HYPERCHOLESTEROLEMIA 01/10/2008   GERD 01/10/2008    PCP: Dr Adrian Prince  REFERRING PROVIDER: Dr Barnett Abu   REFERRING DIAG: Cervicalgia  THERAPY DIAG:  Cervicalgia  Other symptoms and signs involving the musculoskeletal system  Muscle weakness (generalized)  Abnormal posture  Rationale for Evaluation and Treatment: Rehabilitation  ONSET DATE: 04/13/22  SUBJECTIVE:  SUBJECTIVE STATEMENT: Patient reports that she has stiffness in the neck.she is having some neuropathy in her feet which has bothered her more that the neck. She feels she is improving overall.     EVAL: increase in neck pain following yard work ~ 3-4 months ago. She is better with Advil but it is worse in the evening. She has  some degenerative changes in the cervical spine.  Hand dominance: Right  PERTINENT HISTORY:  HTN; cervical sx with fusion 1996; arthritis; melanoma   PAIN:  Are you having pain? Yes: NPRS scale: 6/10(tight - not pain); gets up to 7-8/10 Pain location: posterior cervical spine  Pain description: tightness; nagging; into head; stiffness  Aggravating factors: worse first thing in the morning and toward the end of the day; activities  Relieving factors: OTC antiinflammatory; moving   PRECAUTIONS: None  WEIGHT BEARING RESTRICTIONS: No  FALLS:  Has patient fallen in last 6 months? 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 Cervical spondylosis, slightly progressed at the C3-4 and C4-5 levels. 2. Mild-moderate canal stenosis at C4-5 with moderate right foraminal stenosis. 3. Mild-moderate right foraminal stenosis at C3-4. 4. Prior C5-C7 ACDF with solid arthrodesis.  PATIENT SURVEYS:  FOTO 44; goal 56 10/02/22: 53  SENSATION: Intermittent tingling in Rt hand   POSTURE: Patient presents with head forward posture with increased thoracic kyphosis; shoulders rounded and elevated; scapulae abducted and rotated along the thoracic spine; head of the humerus anterior in orientation.   PALPATION: Muscular tightness in ant/lat/post cervical musculature; pecs; upper trap; leveator  10/02/22: persistent tightness deeper layers of cervical musculature into occipital area   CERVICAL ROM:   Active ROM A/PROM (deg) eval AROM 10/02/2022  Flexion 41 42  Extension 40 tight; pain 49 mild tightness  Right lateral flexion 25 tight  22 tight  Left lateral flexion 26 tight 26 tight  Right  rotation 51 tight 56 tight   Left rotation 27 tight  48 tight    (Blank rows = not tested)  UPPER EXTREMITY STRENGTH: no pain with resistive testing   Active ROM Right eval Left eval  Shoulder flexion 5 5  Shoulder extension 5 5  Shoulder abduction 5 5  Shoulder adduction    Shoulder extension    Shoulder internal rotation    Shoulder external rotation    Middle trap  4 4  Lower trap  4 4  Wrist flexion    Wrist extension    Wrist ulnar deviation    Wrist radial deviation    Wrist pronation    Wrist supination     (Blank rows = not tested)  UPPER EXTREMITY ROM: end range tightness as noted   MMT Right eval Left eval  Shoulder flexion Tight  Tight   Shoulder extension    Shoulder abduction Tight  Tight   Shoulder adduction    Shoulder extension    Shoulder internal rotation Tight  Tight   Shoulder external rotation    Middle trapezius    Lower trapezius    Elbow flexion    Elbow extension    Wrist flexion    Wrist extension    Wrist ulnar deviation  Wrist radial deviation    Wrist pronation    Wrist supination    Grip strength     (Blank rows = not tested)  CERVICAL SPECIAL TESTS:  Upper limb tension test (ULTT): Positive - tight R > L   [facility] Adult PT Treatment:    (dry needling)       DATE: 10/09/22 Therapeutic Exercise: Supine  Chin tuck 10 sec x 10  Standing  UBE L5 x4 in alt fwd/back  Doorway stretch 3 positions 30 sec x 2  L's w/noodle along spine yellow TB 3 sec x 10  W's w/noodle along spine yellow TB 3 sec x 10  Row green TB 3 sec x 20  Shoulder extension green TB 3 sec x 20  Sitting  Chin tuck 5 sec x 5 Lateral cervical flexion 5 sec x 5 R/L Cervical rotation 3 sec x 5 R/L  Manual Therapy: STM through ant/lat/post cervical musculature pt supine  Skilled palpation to assess response to manual work and DN Trigger Point Dry-Needling  Treatment instructions: Expect mild to moderate muscle soreness. S/S of pneumothorax if dry needled over a  lung field, and to seek immediate medical attention should they occur. Patient verbalized understanding of these instructions and education.  Patient Consent Given: Yes Education handout provided: Previously provided Muscles treated: suboccipitals; Bilat cervical paraspinals; scaleni  Electrical stimulation performed: No Parameters: N/A Treatment response/outcome: decreased palpable tightness   Neuromuscular re-ed: Education re- importance of upright posture with improved strength in posterior shoulder girdle musculature  Modalities: Moist heat cervical spine, ice LB  Does not like TENS    [facility] Adult PT Treatment:    (dry needling)       DATE: 10/04/22 Therapeutic Exercise: Supine  Chin tuck 10 sec x 10  Standing  UBE L4 x4 in alt fwd/back  Doorway stretch 3 positions 30 sec x 2  L's w/noodle along spine yellow TB 3 sec x 10  W's w/noodle along spine yellow TB 3 sec x 10  Sitting  Chin tuck 5 sec x 5 Lateral cervical flexion 5 sec x 5 R/L Cervical rotation 3 sec x 5 R/L  Manual Therapy: STM through ant/lat/post cervical musculature pt supine  Skilled palpation to assess response to manual work and DN Trigger Point Dry-Needling  Treatment instructions: Expect mild to moderate muscle soreness. S/S of pneumothorax if dry needled over a lung field, and to seek immediate medical attention should they occur. Patient verbalized understanding of these instructions and education.  Patient Consent Given: Yes Education handout provided: Previously provided Muscles treated: suboccipitals; Bilat cervical paraspinals; scaleni  Electrical stimulation performed: No Parameters: N/A Treatment response/outcome: decreased palpable tightness   Neuromuscular re-ed: Education re- importance of upright posture with improved strength in posterior shoulder girdle musculature  Modalities: Moist heat cervical spine, ice LB  Does not like TENS  Self Care: Use of noodle for sitting  Use of pillows  for electronics when sitting   PATIENT EDUCATION:  Education details: POC; HEP Person educated: Patient Education method: Programmer, multimedia, Facilities manager, Actor cues, Verbal cues, and Handouts Education comprehension: verbalized understanding, returned demonstration, verbal cues required, tactile cues required, and needs further education  HOME EXERCISE PROGRAM: Access Code: [email or web address] URL: [email or web address] Date: 09/11/2022 Prepared by: Corlis Leak  Exercises - Supine Cervical Retraction with Towel  - 2 x daily - 7 x weekly - 1 sets - 5-10 reps - 10 sec  hold - Seated Cervical Retraction  - 2 x daily - 7 x weekly -  1-2 sets - 5-10 reps - 10 sec  hold - Seated Scapular Retraction  - 2 x daily - 7 x weekly - 1-2 sets - 10 reps - 10 sec  hold - Doorway Pec Stretch at 60 Degrees Abduction  - 3 x daily - 7 x weekly - 1 sets - 3 reps - Doorway Pec Stretch at 90 Degrees Abduction  - 3 x daily - 7 x weekly - 1 sets - 3 reps - 30 seconds  hold - Doorway Pec Stretch at 120 Degrees Abduction  - 3 x daily - 7 x weekly - 1 sets - 3 reps - 30 second hold  hold - Shoulder External Rotation and Scapular Retraction with Resistance  - 2 x daily - 7 x weekly - 1 sets - 10 reps - 3-5 sec  hold - Shoulder W - External Rotation with Resistance  - 2 x daily - 7 x weekly - 1-2 sets - 10 reps - 3 sec  hold - Standing Bilateral Low Shoulder Row with Anchored Resistance  - 2 x daily - 7 x weekly - 1-3 sets - 10 reps - 2-3 sec  hold - Shoulder extension with resistance - Neutral  - 1 x daily - 7 x weekly - 1-2 sets - 10 reps - 3-5 sec  hold - Drawing Bow  - 1 x daily - 7 x weekly - 1 sets - 10 reps - 3 sec  hold Patient Education - Trigger Point Dry Needling  ASSESSMENT:  CLINICAL IMPRESSION: Persistent  palpable tightness through the posterior cervical musculature. Improving symptoms with decreased tightness and discomfort. Patient demonstrates some increasing cervical ROM with the exception of  lateral cervical flexion which remains the most limited cervical range and continues to feel tight. Focus on STM and myofacial release to address increased pain and tightness as well as exercises for cervical ROM and posterior shoulder girdle strengthening. Encouraged work on posture and alignment and exercises, keeping chin tucked to avoid extension at higher level cervical spine. Patient to work to do TB exercises consistently.   EVAL: Patient is a 77 y.o. female who was seen today for physical therapy evaluation and treatment for cervicalgia. She has a history of ACDF C5/6 1996 and intermittent cervical pain and dysfunction since that time. Patient reports irritation of cervical symptoms with yard work ~ 4 months ago. She has muscular tightness and pain with movement. Symptoms are worse first thing in the morning and late day. She has some headaches related to tightness in the neck. Patient presents with poor posture and alignment; limited cervical and thoracic/shoulder ROM/mobility; muscular tightness to palpation; postural weakness.  OBJECTIVE IMPAIRMENTS:  ACDF C5/6 1996 and intermittent cervical pain and dysfunction since that time. Patient reports irritation of cervical symptoms with yard work ~ 4 months ago. She has muscular tightness and pain with movement. Symptoms are worse first thing in the morning and late day. She has some headaches related to tightness in the neck. Patient presents with poor posture and alignment; limited cervical and thoracic/shoulder ROM/mobility; muscular tightness to palpation; postural weaknessdecreased activity tolerance, decreased mobility, decreased ROM, decreased strength, increased fascial restrictions, increased muscle spasms, impaired flexibility, improper body mechanics, postural dysfunction, and pain.    GOALS: Goals reviewed with patient? Yes  SHORT TERM GOALS: Target date: 09/21/2022  Independent in initial HEP  Baseline:  Goal status: met  2.  75%  decrease in frequency and intensity of headaches  Baseline:  Goal status: met   LONG  TERM GOALS: Target date: 11/02/2022   Decrease cervical pain by 75-100% allowing patient to return to all normal functional activities  Baseline:  Goal status: on going   2.  Increase cervical ROM to WFL's with no pain  Baseline:  Goal status: on going   3.  Improve posture and alignment with patient to demonstrate improved upright posture with posterior shoulder girdle engaged  Baseline:  Goal status: on going   4.  5/5 strength posterior shoulder girdle in middle and lower traps  Baseline:  Goal status: on going   5.  Independent in HEP  Baseline:  Goal status:on going   6.  Improve functional limitation score to 56 Baseline: 44 10/02/22: 53 Goal status:on going    PLAN:  PT FREQUENCY: 2x/week  PT DURATION: 12 weeks  PLANNED INTERVENTIONS: Therapeutic exercises, Therapeutic activity, Neuromuscular re-education, Balance training, Gait training, Patient/Family education, Self Care, Joint mobilization, Aquatic Therapy, Dry Needling, Electrical stimulation, Spinal mobilization, Cryotherapy, Moist heat, Taping, Ultrasound, Ionotophoresis 4mg /ml Dexamethasone, Manual therapy, and Re-evaluation  PLAN FOR NEXT SESSION: review and progress exercises program; continue with spine care and body mechanics education; manual work, DN, modalities as indicated    [employer], PT 10/09/2022, 11:00 AM

## 2022-10-12 ENCOUNTER — Encounter: Payer: Self-pay | Admitting: Rehabilitative and Restorative Service Providers"

## 2022-10-12 ENCOUNTER — Ambulatory Visit: Payer: Medicare Other | Admitting: Rehabilitative and Restorative Service Providers"

## 2022-10-12 DIAGNOSIS — M542 Cervicalgia: Secondary | ICD-10-CM

## 2022-10-12 DIAGNOSIS — R293 Abnormal posture: Secondary | ICD-10-CM

## 2022-10-12 DIAGNOSIS — R29898 Other symptoms and signs involving the musculoskeletal system: Secondary | ICD-10-CM | POA: Diagnosis not present

## 2022-10-12 DIAGNOSIS — M6281 Muscle weakness (generalized): Secondary | ICD-10-CM | POA: Diagnosis not present

## 2022-10-12 NOTE — Therapy (Signed)
OUTPATIENT PHYSICAL THERAPY CERVICAL TREATMENT       Patient Name: Wendy [name] MRN: [id number] DOB:07-29-1945, 77 y.o., female Today's Date: 10/12/2022  END OF SESSION:  PT End of Session - 10/12/22 1101     Visit Number 13    Number of Visits 24    Date for PT Re-Evaluation 11/02/22    Authorization Type medicare + supplemental    Progress Note Due on Visit 20    PT Start Time 1100    PT Stop Time 1151    PT Time Calculation (min) 51 min    Activity Tolerance Patient tolerated treatment well             Past Medical History:  Diagnosis Date   Anxiety    Atypical chest pain    B12 deficiency    Cervical spondylosis without myelopathy    Diverticulosis of colon    Dysesthesia    GERD (gastroesophageal reflux disease)    Hypercholesteremia    Hypertension    IBS (irritable bowel syndrome)    Malignant melanoma (HCC)    Vitreous hemorrhage (HCC)    Past Surgical History:  Procedure Laterality Date   anterior cervical discectomy and fusion  1996   Dr. 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   Chronic cystitis 01/18/2022   Degeneration of lumbar intervertebral disc 01/18/2022   Lumbar spondylosis 01/18/2022   Lumbar radiculopathy 01/18/2022   Constipation 01/18/2022   Myalgia due to statin 01/18/2022   Aortic atherosclerosis (HCC) 01/18/2022   Hyponatremia 04/30/2021   Weakness 04/30/2021   COVID-19 virus infection 04/30/2021   Pain in wrist 01/16/2019   Neck pain 01/10/2019   Impacted cerumen of both ears 08/07/2018    Eustachian tube dysfunction, bilateral 08/07/2018   Mixed hyperlipidemia 03/30/2016   Vaginitis, atrophic 05/26/2015   Carotid artery disease (HCC) 02/15/2015   Voiding dysfunction 12/11/2014   Third degree hemorrhoids 10/13/2014   Procidentia of uterus 09/23/2014   Occlusion and stenosis of carotid artery without mention of cerebral infarction 09/02/2013   Bilateral dry eyes 07/14/2012   Actinic keratoses 05/25/2012   Nuclear cataract, nonsenile 12/25/2011   Other seborrheic keratosis 09/18/2011   History of malignant melanoma of skin 05/11/2011   OBSTRUCTIVE SLEEP APNEA 11/19/2009   SNORING 10/11/2009   Abnormal respiratory rate 10/11/2009   LACERATION OF FINGER 09/03/2009   DYSESTHESIA 05/23/2008   MALIGNANT MELANOMA 01/13/2008   ANXIETY 01/13/2008   VITREOUS HEMORRHAGE 01/13/2008   HTN (hypertension) 01/13/2008   DIVERTICULOSIS OF COLON 01/13/2008   IRRITABLE BOWEL SYNDROME 01/13/2008   CERVICAL SPONDYLOSIS WITHOUT MYELOPATHY 01/13/2008   CHEST PAIN, ATYPICAL 01/13/2008   HYPERCHOLESTEROLEMIA 01/10/2008   GERD 01/10/2008    PCP: Dr Adrian Prince  REFERRING PROVIDER: Dr Barnett Abu   REFERRING DIAG: Cervicalgia  THERAPY DIAG:  Cervicalgia  Other symptoms and signs involving the musculoskeletal system  Muscle weakness (generalized)  Abnormal posture  Rationale for Evaluation and Treatment: Rehabilitation  ONSET DATE: 04/13/22  SUBJECTIVE:  SUBJECTIVE STATEMENT: Patient reports that she felt pretty good when she woke up this morning but seems to be tightening up now. She has been working on her exercises at home more including with therabands. She will be out of town next week but will continue to do her exercises.   EVAL: increase in neck pain following yard work ~  3-4 months ago. She is better with Advil but it is worse in the evening. She has some degenerative changes in the cervical spine.  Hand dominance: Right  PERTINENT HISTORY:  HTN; cervical sx with fusion 1996; arthritis; melanoma   PAIN:  Are you having pain? Yes: NPRS scale: 6/10(tight - not pain); gets up to 7-8/10 Pain location: posterior cervical spine  Pain description: tightness; nagging; into head; stiffness  Aggravating factors: worse first thing in the morning and toward the end of the day; activities  Relieving factors: OTC antiinflammatory; moving   PRECAUTIONS: None  WEIGHT BEARING RESTRICTIONS: No  FALLS:  Has patient fallen in last 6 months? 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 Cervical spondylosis, slightly progressed at the C3-4 and C4-5 levels. 2. Mild-moderate canal stenosis at C4-5 with moderate right foraminal stenosis. 3. Mild-moderate right foraminal stenosis at C3-4. 4. Prior C5-C7 ACDF with solid arthrodesis.  PATIENT SURVEYS:  FOTO 44; goal 56 10/02/22: 53  SENSATION: Intermittent tingling in Rt hand   POSTURE: Patient presents with head forward posture with increased thoracic kyphosis; shoulders rounded and elevated; scapulae abducted and rotated along the thoracic spine; head of the humerus anterior in orientation.   PALPATION: Muscular tightness in ant/lat/post cervical musculature; pecs; upper trap; leveator  10/02/22: persistent tightness deeper layers of cervical musculature into occipital area   CERVICAL ROM:   Active ROM A/PROM (deg) eval AROM 10/02/2022  Flexion 41 42  Extension 40 tight; pain 49 mild tightness  Right lateral  flexion 25 tight  22 tight  Left lateral flexion 26 tight 26 tight  Right rotation 51 tight 56 tight   Left rotation 27 tight  48 tight    (Blank rows = not tested)  UPPER EXTREMITY STRENGTH: no pain with resistive testing   Active ROM Right eval Left eval  Shoulder flexion 5 5  Shoulder extension 5 5  Shoulder abduction 5 5  Shoulder adduction    Shoulder extension    Shoulder internal rotation    Shoulder external rotation    Middle trap  4 4  Lower trap  4 4  Wrist flexion    Wrist extension    Wrist ulnar deviation    Wrist radial deviation    Wrist pronation    Wrist supination     (Blank rows = not tested)  UPPER EXTREMITY ROM: end range tightness as noted   MMT Right eval Left eval  Shoulder flexion Tight  Tight   Shoulder extension    Shoulder abduction Tight  Tight   Shoulder adduction    Shoulder extension    Shoulder internal rotation Tight  Tight   Shoulder external rotation    Middle trapezius    Lower trapezius    Elbow flexion    Elbow extension  Wrist flexion    Wrist extension    Wrist ulnar deviation    Wrist radial deviation    Wrist pronation    Wrist supination    Grip strength     (Blank rows = not tested)  CERVICAL SPECIAL TESTS:  Upper limb tension test (ULTT): Positive - tight R > L   [facility] Adult PT Treatment:    (dry needling)       DATE: 10/12/22 Therapeutic Exercise: Supine  Chin tuck 10 sec x 10  Standing  UBE L5 x4 in alt fwd/back  Axial extension/chin tuck 5 sec x 10 trial of ball and then pillow behind head for chin tuck  Doorway stretch 3 positions 30 sec x 2  L's w/noodle along spine yellow TB 3 sec x 10  W's w/noodle along spine yellow TB 3 sec x 10  Row green TB 3 sec x 20  Shoulder extension green TB 3 sec x 20  Sitting  Chin tuck 5 sec x 5 Lateral cervical flexion 5 sec x 5 R/L Cervical rotation 3 sec x 5 R/L  Manual Therapy: STM through ant/lat/post cervical musculature pt supine  Skilled palpation to  assess response to manual work and DN Trigger Point Dry-Needling  Treatment instructions: Expect mild to moderate muscle soreness. S/S of pneumothorax if dry needled over a lung field, and to seek immediate medical attention should they occur. Patient verbalized understanding of these instructions and education.  Patient Consent Given: Yes Education handout provided: Previously provided Muscles treated: suboccipitals; Bilat cervical paraspinals; scaleni  Electrical stimulation performed: No Parameters: N/A Treatment response/outcome: decreased palpable tightness   Neuromuscular re-ed: Working on posture and alignment/position of head and thoracic spine in sitting and standing Education re- importance of upright posture with improved strength in posterior shoulder girdle musculature  Modalities: Moist heat cervical spine, ice LB  Does not like TENS    [facility] Adult PT Treatment:    (dry needling)       DATE: 10/09/22 Therapeutic Exercise: Supine  Chin tuck 10 sec x 10  Standing  UBE L5 x4 in alt fwd/back  Doorway stretch 3 positions 30 sec x 2  L's w/noodle along spine yellow TB 3 sec x 10  W's w/noodle along spine yellow TB 3 sec x 10  Row green TB 3 sec x 20  Shoulder extension green TB 3 sec x 20  Sitting  Chin tuck 5 sec x 5 Lateral cervical flexion 5 sec x 5 R/L Cervical rotation 3 sec x 5 R/L  Manual Therapy: STM through ant/lat/post cervical musculature pt supine  Skilled palpation to assess response to manual work and DN Trigger Point Dry-Needling  Treatment instructions: Expect mild to moderate muscle soreness. S/S of pneumothorax if dry needled over a lung field, and to seek immediate medical attention should they occur. Patient verbalized understanding of these instructions and education.  Patient Consent Given: Yes Education handout provided: Previously provided Muscles treated: suboccipitals; Bilat cervical paraspinals; scaleni  Electrical stimulation performed:  No Parameters: N/A Treatment response/outcome: decreased palpable tightness   Neuromuscular re-ed: Education re- importance of upright posture with improved strength in posterior shoulder girdle musculature  Modalities: Moist heat cervical spine, ice LB  Does not like TENS     PATIENT EDUCATION:  Education details: POC; HEP Person educated: Patient Education method: Programmer, multimedia, Demonstration, Actor cues, Verbal cues, and Handouts Education comprehension: verbalized understanding, returned demonstration, verbal cues required, tactile cues required, and needs further education  HOME EXERCISE PROGRAM: Access  Code: [email or web address] URL: [email or web address] Date: 09/11/2022 Prepared by: Corlis Leak  Exercises - Supine Cervical Retraction with Towel  - 2 x daily - 7 x weekly - 1 sets - 5-10 reps - 10 sec  hold - Seated Cervical Retraction  - 2 x daily - 7 x weekly - 1-2 sets - 5-10 reps - 10 sec  hold - Seated Scapular Retraction  - 2 x daily - 7 x weekly - 1-2 sets - 10 reps - 10 sec  hold - Doorway Pec Stretch at 60 Degrees Abduction  - 3 x daily - 7 x weekly - 1 sets - 3 reps - Doorway Pec Stretch at 90 Degrees Abduction  - 3 x daily - 7 x weekly - 1 sets - 3 reps - 30 seconds  hold - Doorway Pec Stretch at 120 Degrees Abduction  - 3 x daily - 7 x weekly - 1 sets - 3 reps - 30 second hold  hold - Shoulder External Rotation and Scapular Retraction with Resistance  - 2 x daily - 7 x weekly - 1 sets - 10 reps - 3-5 sec  hold - Shoulder W - External Rotation with Resistance  - 2 x daily - 7 x weekly - 1-2 sets - 10 reps - 3 sec  hold - Standing Bilateral Low Shoulder Row with Anchored Resistance  - 2 x daily - 7 x weekly - 1-3 sets - 10 reps - 2-3 sec  hold - Shoulder extension with resistance - Neutral  - 1 x daily - 7 x weekly - 1-2 sets - 10 reps - 3-5 sec  hold - Drawing Bow  - 1 x daily - 7 x weekly - 1 sets - 10 reps - 3 sec  hold Patient Education - Trigger Point Dry  Needling  ASSESSMENT:  CLINICAL IMPRESSION: Some improvement in tightness in the morning but persistent palpable tightness through the posterior cervical musculature. Worked on transitional movements with patient avoiding pressing with her head to roll or turn in bed. Improving symptoms with decreased tightness and discomfort. Patient demonstrates some increasing cervical ROM with the exception of lateral cervical flexion which remains the most limited cervical range and continues to feel tight. Focus on STM and myofacial release to address increased pain and tightness as well as exercises for cervical ROM and posterior shoulder girdle strengthening. Encouraged work on posture and alignment and exercises, keeping chin tucked to avoid extension at higher level cervical spine. Patient to work to do TB exercises consistently.   EVAL: Patient is a 77 y.o. female who was seen today for physical therapy evaluation and treatment for cervicalgia. She has a history of ACDF C5/6 1996 and intermittent cervical pain and dysfunction since that time. Patient reports irritation of cervical symptoms with yard work ~ 4 months ago. She has muscular tightness and pain with movement. Symptoms are worse first thing in the morning and late day. She has some headaches related to tightness in the neck. Patient presents with poor posture and alignment; limited cervical and thoracic/shoulder ROM/mobility; muscular tightness to palpation; postural weakness.  OBJECTIVE IMPAIRMENTS:  ACDF C5/6 1996 and intermittent cervical pain and dysfunction since that time. Patient reports irritation of cervical symptoms with yard work ~ 4 months ago. She has muscular tightness and pain with movement. Symptoms are worse first thing in the morning and late day. She has some headaches related to tightness in the neck. Patient presents with poor posture and alignment; limited cervical and thoracic/shoulder ROM/mobility; muscular  tightness to  palpation; postural weaknessdecreased activity tolerance, decreased mobility, decreased ROM, decreased strength, increased fascial restrictions, increased muscle spasms, impaired flexibility, improper body mechanics, postural dysfunction, and pain.    GOALS: Goals reviewed with patient? Yes  SHORT TERM GOALS: Target date: 09/21/2022  Independent in initial HEP  Baseline:  Goal status: met  2.  75% decrease in frequency and intensity of headaches  Baseline:  Goal status: met   LONG TERM GOALS: Target date: 11/02/2022   Decrease cervical pain by 75-100% allowing patient to return to all normal functional activities  Baseline:  Goal status: on going   2.  Increase cervical ROM to WFL's with no pain  Baseline:  Goal status: on going   3.  Improve posture and alignment with patient to demonstrate improved upright posture with posterior shoulder girdle engaged  Baseline:  Goal status: on going   4.  5/5 strength posterior shoulder girdle in middle and lower traps  Baseline:  Goal status: on going   5.  Independent in HEP  Baseline:  Goal status:on going   6.  Improve functional limitation score to 56 Baseline: 44 10/02/22: 53 Goal status:on going    PLAN:  PT FREQUENCY: 2x/week  PT DURATION: 12 weeks  PLANNED INTERVENTIONS: Therapeutic exercises, Therapeutic activity, Neuromuscular re-education, Balance training, Gait training, Patient/Family education, Self Care, Joint mobilization, Aquatic Therapy, Dry Needling, Electrical stimulation, Spinal mobilization, Cryotherapy, Moist heat, Taping, Ultrasound, Ionotophoresis 4mg /ml Dexamethasone, Manual therapy, and Re-evaluation  PLAN FOR NEXT SESSION: review and progress exercises program; continue with spine care and body mechanics education; manual work, DN, modalities as indicated    [employer], PT 10/12/2022, 11:01 AM

## 2022-10-13 DIAGNOSIS — E7800 Pure hypercholesterolemia, unspecified: Secondary | ICD-10-CM | POA: Diagnosis not present

## 2022-10-13 LAB — HEPATIC FUNCTION PANEL
ALT: 30 IU/L (ref 0–32)
AST: 27 IU/L (ref 0–40)
Albumin: 4.6 g/dL (ref 3.8–4.8)
Alkaline Phosphatase: 52 IU/L (ref 44–121)
Bilirubin Total: 0.7 mg/dL (ref 0.0–1.2)
Bilirubin, Direct: 0.17 mg/dL (ref 0.00–0.40)
Total Protein: 6.8 g/dL (ref 6.0–8.5)

## 2022-10-13 LAB — LIPID PANEL
Chol/HDL Ratio: 3.8 ratio (ref 0.0–4.4)
Cholesterol, Total: 188 mg/dL (ref 100–199)
HDL: 49 mg/dL (ref >39)
LDL Chol Calc (NIH): 106 mg/dL — ABNORMAL HIGH (ref 0–99)
Triglycerides: 188 mg/dL — ABNORMAL HIGH (ref 0–149)
VLDL Cholesterol Cal: 33 mg/dL (ref 5–40)

## 2022-10-16 ENCOUNTER — Ambulatory Visit: Payer: Medicare Other | Admitting: Rehabilitative and Restorative Service Providers"

## 2022-10-19 ENCOUNTER — Encounter: Payer: Medicare Other | Admitting: Rehabilitative and Restorative Service Providers"

## 2022-10-20 ENCOUNTER — Ambulatory Visit: Payer: Medicare Other | Admitting: Cardiovascular Disease

## 2022-10-22 ENCOUNTER — Encounter: Payer: Self-pay | Admitting: Cardiovascular Disease

## 2022-10-22 NOTE — Progress Notes (Signed)
Wendy [name] Date of Birth  1946/01/01       [facility] Office [address]  [address] [place], [place]  [address]   [place], [place]  [address] [phone]     [phone]   Fax  [phone]     Fax [phone]  Problem List:  1. Mild carotid artery disease 2  Essential HTN; 3. Hyperlipidemia   Previous notes:   Wendy [name] is a 77 yo who I have seen in the past for elevated cholesterol for years.  She was recently at [facility] for evaluation of her TMJ and was noted to have some calcifications in her carotid artery.  She was told to see a cardiologist  She walks 4-5 times a week ( 3 miles at a time)  without chest pain or shortness breath. She denies any syncope or presyncopal episodes.   She was seen in consultation by Betti Cruz and her carotid duplex was negative for obstructive disease.  She has mild bilateral carotid artery disease. She has also seen Dr . Jule Ser.   She works part time for [employer].   Dec. 19, 2016: Had a hysterectomy and bladder tuck at [facility].  Had some hyponatremia -  Called in a cardiology consult.    Did an echo which was normal  Doing better now.   BP spiked in Nov.  Went to the ER .    was taken off [place].   Was started on amlodipine / valsartan 2.5 / 80 a day  Feb. 2, 2018:  Wendy [name] is seen back for her her yearly visit .   Not exercising much .     Jul 23, 2019:  Wendy [name] is seen today after 3-year absence.    Has gained weight in covid.  No CP or dyspnea  Exercises - walks 3-4 miles a day  Has had some plantar fascitis .   Nov. 16, 2022 Wendy [name] is seen for follow up of her HLD, HTN, mild carotid artery disease. Feeling well    Oct. 23, 2023 Wendy [name] is seen today for follow up of her HTN, HLD, mild carotid artery disease   She was on Spironolactone but this was stopped due to hyponatremia .  She reduced her water intake and the sodium levels improved.   She also had a  coronary calcium score . Total calcium score of 103 is at percentile 58 for subjects of the same age, gender, and race/ethnicity  Has had difficulty with BP control.   She is on low-dose rosuvastatin but does not tolerate any higher dose.  She has significant myalgias with rosuvastatin and pravastatin.  I suspect that she has been tried on numerous other statins.  She has coronary calcifications and has mild carotid artery disease.  Her LDL goal is 55.  Her current LDL is 120.  We will refer her to the lipid clinic for consideration of a PCSK9 inhibitor.   Aug. 26, 2024 Wendy [name] is seen for follow up of her HLD, HTN  She has been seen by  Laural Golden, RPH, ( HTN visit) Phillips Hay, RPH ( lipid clinic)  Praluent has been discussed (?)     Current Outpatient Medications on File Prior to Visit  Medication Sig Dispense Refill   ALPRAZolam (XANAX) 0.25 MG tablet Take 1 tablet by mouth daily as needed.     amLODipine (NORVASC) 5 MG tablet Take 5 mg by mouth daily.  ascorbic acid (VITAMIN C) 500 MG tablet Take 1 tablet (500 mg total) by mouth daily. 90 tablet 0   aspirin 81 MG tablet Take 81 mg by mouth daily.     b complex vitamins tablet Take 1 tablet by mouth daily.     Cholecalciferol (VITAMIN D) 2000 UNITS CAPS Take 1 capsule by mouth daily.     Coenzyme Q10 (COQ10) 200 MG CAPS Take 1 capsule by mouth daily.     cyclobenzaprine (FLEXERIL) 10 MG tablet Take 10 mg by mouth daily as needed.     DIGEST ENZYMES-ANTICHOLINERGIC PO Take 1 capsule by mouth 3 (three) times daily. PRN     hydrALAZINE (APRESOLINE) 10 MG tablet Take 1 tablet (10 mg total) by mouth 3 (three) times daily. 90 tablet 0   metoprolol succinate (TOPROL-XL) 25 MG 24 hr tablet TAKE 1 TABLET (25 MG TOTAL) BY MOUTH DAILY. 90 tablet 2   Misc Natural Products (TART CHERRY ADVANCED) CAPS Take 2 capsules by mouth daily.     Multiple Vitamins-Minerals (MULTIVITAMIN & MINERAL PO) Take 1 tablet by mouth daily.     Omega-3  Fatty Acids (FISH OIL PO) Take 1 capsule by mouth daily.     spironolactone (ALDACTONE) 25 MG tablet Take 1 tablet (25 mg total) by mouth daily. 90 tablet 3   valsartan (DIOVAN) 80 MG tablet Take 1 tablet (80 mg total) by mouth 2 (two) times daily. 180 tablet 3   zinc sulfate 220 (50 Zn) MG capsule Take 1 capsule by mouth daily.     No current facility-administered medications on file prior to visit.    Allergies  Allergen Reactions   Hctz [Hydrochlorothiazide] Other (See Comments)    hyponatremia   Codeine Nausea Only and Nausea And Vomiting   Hydrocodone-Acetaminophen Nausea And Vomiting   Lisinopril Other (See Comments)    REACTION: INTOL to Prinivil w/ pain on urination    Neomycin Dermatitis   Other Nausea Only    fragrance   Oxycodone Nausea And Vomiting   Pravastatin Sodium Other (See Comments)    REACTION: INTOL to Pravachol w/ myalgias   Ciprofloxacin Other (See Comments)    Past Medical History:  Diagnosis Date   Anxiety    Atypical chest pain    B12 deficiency    Cervical spondylosis without myelopathy    Diverticulosis of colon    Dysesthesia    GERD (gastroesophageal reflux disease)    Hypercholesteremia    Hypertension    IBS (irritable bowel syndrome)    Malignant melanoma (HCC)    Vitreous hemorrhage (HCC)     Past Surgical History:  Procedure Laterality Date   anterior cervical discectomy and fusion  1996   Dr. Newell Coral   arthroscopic shoulder surgery     CESAREAN SECTION     melanoma surgery from ant neck region  1995   skin cancer removed from nose      Social History   Tobacco Use  Smoking Status Never  Smokeless Tobacco Never    Social History   Substance and Sexual Activity  Alcohol Use No    Family History  Problem Relation Age of Onset   Diabetes Mother    Heart disease Mother    Hyperlipidemia Mother    Hypertension Mother    Cancer Father    Deep vein thrombosis Father    Heart attack Father    Cancer Brother      Reviw of Systems:  Reviewed in the HPI.  All other  systems are negative.   Physical Exam: There were no vitals taken for this visit.  No BP recorded.  {Refresh Note OR Click here to enter BP  :1}***    GEN:  Well nourished, well developed in no acute distress HEENT: Normal NECK: No JVD; No carotid bruits LYMPHATICS: No lymphadenopathy CARDIAC: RRR ***, no murmurs, rubs, gallops RESPIRATORY:  Clear to auscultation without rales, wheezing or rhonchi  ABDOMEN: Soft, non-tender, non-distended MUSCULOSKELETAL:  No edema; No deformity  SKIN: Warm and dry NEUROLOGIC:  Alert and oriented x 3    ECG:    Assessment / Plan:   1. Mild carotid artery disease-          2  Essential HTN:        3. Hyperlipidemia-   4.  Coronary artery calcifications:        Kristeen Miss, MD  10/22/2022 12:52 PM    [facility] [address] [place],  [address] [place], [place]  [address] Pager [phone] Phone: [phone]; Fax: [phone]

## 2022-10-23 ENCOUNTER — Ambulatory Visit: Payer: Medicare Other | Admitting: Rehabilitative and Restorative Service Providers"

## 2022-10-23 ENCOUNTER — Encounter: Payer: Self-pay | Admitting: Rehabilitative and Restorative Service Providers"

## 2022-10-23 ENCOUNTER — Ambulatory Visit: Payer: Medicare Other | Attending: Cardiovascular Disease | Admitting: Cardiovascular Disease

## 2022-10-23 ENCOUNTER — Encounter: Payer: Self-pay | Admitting: Cardiovascular Disease

## 2022-10-23 VITALS — BP 132/84 | HR 72 | Ht 60.0 in | Wt 146.0 lb

## 2022-10-23 DIAGNOSIS — I10 Essential (primary) hypertension: Secondary | ICD-10-CM | POA: Diagnosis not present

## 2022-10-23 DIAGNOSIS — M6281 Muscle weakness (generalized): Secondary | ICD-10-CM | POA: Diagnosis not present

## 2022-10-23 DIAGNOSIS — I6523 Occlusion and stenosis of bilateral carotid arteries: Secondary | ICD-10-CM | POA: Insufficient documentation

## 2022-10-23 DIAGNOSIS — R293 Abnormal posture: Secondary | ICD-10-CM | POA: Diagnosis not present

## 2022-10-23 DIAGNOSIS — R29898 Other symptoms and signs involving the musculoskeletal system: Secondary | ICD-10-CM | POA: Diagnosis not present

## 2022-10-23 DIAGNOSIS — M542 Cervicalgia: Secondary | ICD-10-CM | POA: Diagnosis not present

## 2022-10-23 NOTE — Therapy (Signed)
OUTPATIENT PHYSICAL THERAPY CERVICAL TREATMENT       Patient Name: Wendy [name] MRN: [id number] DOB:10-10-1945, 77 y.o., female Today's Date: 10/23/2022  END OF SESSION:  PT End of Session - 10/23/22 1101     Visit Number 14    Number of Visits 24    Date for PT Re-Evaluation 11/02/22    Authorization Type medicare + supplemental    Progress Note Due on Visit 20    PT Start Time 1100    PT Stop Time 1150    PT Time Calculation (min) 50 min    Activity Tolerance Patient tolerated treatment well             Past Medical History:  Diagnosis Date   Anxiety    Atypical chest pain    B12 deficiency    Cervical spondylosis without myelopathy    Diverticulosis of colon    Dysesthesia    GERD (gastroesophageal reflux disease)    Hypercholesteremia    Hypertension    IBS (irritable bowel syndrome)    Malignant melanoma (HCC)    Vitreous hemorrhage (HCC)    Past Surgical History:  Procedure Laterality Date   anterior cervical discectomy and fusion  1996   Dr. 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   Chronic cystitis 01/18/2022   Degeneration of lumbar intervertebral disc 01/18/2022   Lumbar spondylosis 01/18/2022   Lumbar radiculopathy 01/18/2022   Constipation 01/18/2022   Myalgia due to statin 01/18/2022   Aortic atherosclerosis (HCC) 01/18/2022   Hyponatremia 04/30/2021   Weakness 04/30/2021   COVID-19 virus infection 04/30/2021   Pain in wrist 01/16/2019   Neck pain 01/10/2019   Impacted cerumen of both ears 08/07/2018    Eustachian tube dysfunction, bilateral 08/07/2018   Mixed hyperlipidemia 03/30/2016   Vaginitis, atrophic 05/26/2015   Carotid artery disease (HCC) 02/15/2015   Voiding dysfunction 12/11/2014   Third degree hemorrhoids 10/13/2014   Procidentia of uterus 09/23/2014   Occlusion and stenosis of carotid artery without mention of cerebral infarction 09/02/2013   Bilateral dry eyes 07/14/2012   Actinic keratoses 05/25/2012   Nuclear cataract, nonsenile 12/25/2011   Other seborrheic keratosis 09/18/2011   History of malignant melanoma of skin 05/11/2011   OBSTRUCTIVE SLEEP APNEA 11/19/2009   SNORING 10/11/2009   Abnormal respiratory rate 10/11/2009   LACERATION OF FINGER 09/03/2009   DYSESTHESIA 05/23/2008   MALIGNANT MELANOMA 01/13/2008   ANXIETY 01/13/2008   VITREOUS HEMORRHAGE 01/13/2008   HTN (hypertension) 01/13/2008   DIVERTICULOSIS OF COLON 01/13/2008   IRRITABLE BOWEL SYNDROME 01/13/2008   CERVICAL SPONDYLOSIS WITHOUT MYELOPATHY 01/13/2008   CHEST PAIN, ATYPICAL 01/13/2008   HYPERCHOLESTEROLEMIA 01/10/2008   GERD 01/10/2008    PCP: Dr Adrian Prince  REFERRING PROVIDER: Dr Barnett Abu   REFERRING DIAG: Cervicalgia  THERAPY DIAG:  Cervicalgia  Other symptoms and signs involving the musculoskeletal system  Muscle weakness (generalized)  Abnormal posture  Rationale for Evaluation and Treatment: Rehabilitation  ONSET DATE: 04/13/22  SUBJECTIVE:  SUBJECTIVE STATEMENT: Patient reports that she was at the beach last week and she had a deep tissue massage which helped a lot. She walked every day. She has been working on her exercises at home more including with therabands.   EVAL: increase in neck pain following yard work ~ 3-4 months ago. She is better with Advil but it is  worse in the evening. She has some degenerative changes in the cervical spine.  Hand dominance: Right  PERTINENT HISTORY:  HTN; cervical sx with fusion 1996; arthritis; melanoma   PAIN:  Are you having pain? Yes: NPRS scale: 0/10(stiff - not pain) Pain location: posterior cervical spine  Pain description: tightness; nagging; into head; stiffness  Aggravating factors: worse first thing in the morning and toward the end of the day; activities  Relieving factors: OTC antiinflammatory; moving   PRECAUTIONS: None  WEIGHT BEARING RESTRICTIONS: No  FALLS:  Has patient fallen in last 6 months? 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 Cervical spondylosis, slightly progressed at the C3-4 and C4-5 levels. 2. Mild-moderate canal stenosis at C4-5 with moderate right foraminal stenosis. 3. Mild-moderate right foraminal stenosis at C3-4. 4. Prior C5-C7 ACDF with solid arthrodesis.  PATIENT SURVEYS:  FOTO 44; goal 56 10/02/22: 53  SENSATION: Intermittent tingling in Rt hand   POSTURE: Patient presents with head forward posture with increased thoracic kyphosis; shoulders rounded and elevated; scapulae abducted and rotated along the thoracic spine; head of the humerus anterior in orientation.   PALPATION: Muscular tightness in ant/lat/post cervical musculature; pecs; upper trap; leveator  10/02/22: persistent tightness deeper layers of cervical musculature into occipital area   CERVICAL ROM:   Active ROM A/PROM (deg) eval AROM 10/02/2022 AROM 10/23/22  Flexion 41 42 52  Extension 40 tight; pain 49 mild tightness 54  Right lateral flexion 25 tight  22 tight 23 tight  Left lateral  flexion 26 tight 26 tight 26 tight  Right rotation 51 tight 56 tight  58 tight   Left rotation 27 tight  48 tight  48 tight    (Blank rows = not tested)  UPPER EXTREMITY STRENGTH: no pain with resistive testing   Active ROM Right eval Left eval  Shoulder flexion 5 5  Shoulder extension 5 5  Shoulder abduction 5 5  Shoulder adduction    Shoulder extension    Shoulder internal rotation    Shoulder external rotation    Middle trap  4 4  Lower trap  4 4  Wrist flexion    Wrist extension    Wrist ulnar deviation    Wrist radial deviation    Wrist pronation    Wrist supination     (Blank rows = not tested)  UPPER EXTREMITY ROM: end range tightness as noted   MMT Right eval Left eval  Shoulder flexion Tight  Tight   Shoulder extension    Shoulder abduction Tight  Tight   Shoulder adduction    Shoulder extension    Shoulder internal rotation Tight  Tight   Shoulder external rotation    Middle trapezius    Lower trapezius    Elbow flexion    Elbow extension  Wrist flexion    Wrist extension    Wrist ulnar deviation    Wrist radial deviation    Wrist pronation    Wrist supination    Grip strength     (Blank rows = not tested)  CERVICAL SPECIAL TESTS:  Upper limb tension test (ULTT): Positive - tight R > L   [facility] Adult PT Treatment:    (dry needling)       DATE: 10/23/22 Therapeutic Exercise: Supine  Chin tuck 10 sec x 10  Standing  UBE L5 x4 in alt fwd/back  Axial extension/chin tuck 5 sec x 10 trial of ball and then pillow behind head for chin tuck  Doorway stretch 3 positions 30 sec x 2  L's w/noodle along spine yellow TB 3 sec x 10  W's w/noodle along spine yellow TB 3 sec x 10  Row green TB 3 sec x 20  Shoulder extension green TB 3 sec x 20  Sitting  Chin tuck 5 sec x 5 Lateral cervical flexion 5 sec x 5 R/L Cervical rotation 3 sec x 5 R/L  Manual Therapy: STM through ant/lat/post cervical musculature pt supine  PROM/stretching cervical spine into  cervical flexion and flexion with slight rotation  Neuromuscular re-ed: Working on posture and alignment/position of head and thoracic spine in sitting and standing Education re- importance of upright posture with improved strength in posterior shoulder girdle musculature  Looked at musculature involved on line and explained the interaction of pecs and upper traps Modalities: Moist heat cervical spine, ice LB  Does not like TENS    [facility] Adult PT Treatment:    (dry needling)       DATE: 10/12/22 Therapeutic Exercise: Supine  Chin tuck 10 sec x 10  Standing  UBE L5 x4 in alt fwd/back  Axial extension/chin tuck 5 sec x 10 trial of ball and then pillow behind head for chin tuck  Doorway stretch 3 positions 30 sec x 2  L's w/noodle along spine yellow TB 3 sec x 10  W's w/noodle along spine yellow TB 3 sec x 10  Row green TB 3 sec x 20  Shoulder extension green TB 3 sec x 20  Sitting  Chin tuck 5 sec x 5 Lateral cervical flexion 5 sec x 5 R/L Cervical rotation 3 sec x 5 R/L  Manual Therapy: STM through ant/lat/post cervical musculature pt supine  Skilled palpation to assess response to manual work and DN Trigger Point Dry-Needling  Treatment instructions: Expect mild to moderate muscle soreness. S/S of pneumothorax if dry needled over a lung field, and to seek immediate medical attention should they occur. Patient verbalized understanding of these instructions and education.  Patient Consent Given: Yes Education handout provided: Previously provided Muscles treated: suboccipitals; Bilat cervical paraspinals; scaleni  Electrical stimulation performed: No Parameters: N/A Treatment response/outcome: decreased palpable tightness   Neuromuscular re-ed: Working on posture and alignment/position of head and thoracic spine in sitting and standing Education re- importance of upright posture with improved strength in posterior shoulder girdle musculature  Modalities: Moist heat cervical  spine, ice LB  Does not like TENS    PATIENT EDUCATION:  Education details: POC; HEP Person educated: Patient Education method: Programmer, multimedia, Demonstration, Actor cues, Verbal cues, and Handouts Education comprehension: verbalized understanding, returned demonstration, verbal cues required, tactile cues required, and needs further education  HOME EXERCISE PROGRAM: Access Code: [email or web address] URL: [email or web address] Date: 09/11/2022 Prepared by: Corlis Leak  Exercises - Supine Cervical Retraction with Towel  - 2  x daily - 7 x weekly - 1 sets - 5-10 reps - 10 sec  hold - Seated Cervical Retraction  - 2 x daily - 7 x weekly - 1-2 sets - 5-10 reps - 10 sec  hold - Seated Scapular Retraction  - 2 x daily - 7 x weekly - 1-2 sets - 10 reps - 10 sec  hold - Doorway Pec Stretch at 60 Degrees Abduction  - 3 x daily - 7 x weekly - 1 sets - 3 reps - Doorway Pec Stretch at 90 Degrees Abduction  - 3 x daily - 7 x weekly - 1 sets - 3 reps - 30 seconds  hold - Doorway Pec Stretch at 120 Degrees Abduction  - 3 x daily - 7 x weekly - 1 sets - 3 reps - 30 second hold  hold - Shoulder External Rotation and Scapular Retraction with Resistance  - 2 x daily - 7 x weekly - 1 sets - 10 reps - 3-5 sec  hold - Shoulder W - External Rotation with Resistance  - 2 x daily - 7 x weekly - 1-2 sets - 10 reps - 3 sec  hold - Standing Bilateral Low Shoulder Row with Anchored Resistance  - 2 x daily - 7 x weekly - 1-3 sets - 10 reps - 2-3 sec  hold - Shoulder extension with resistance - Neutral  - 1 x daily - 7 x weekly - 1-2 sets - 10 reps - 3-5 sec  hold - Drawing Bow  - 1 x daily - 7 x weekly - 1 sets - 10 reps - 3 sec  hold Patient Education - Trigger Point Dry Needling  ASSESSMENT:  CLINICAL IMPRESSION: Good improvement in tightness and pain in the neck and shoulder. Patient has been more consistent with HEP. Improving symptoms with decreased tightness and discomfort. Patient demonstrates some  increasing cervical ROM with the exception of lateral cervical flexion which remains the most limited cervical range and continues to feel tight. Focus on STM and myofacial release to address increased pain and tightness as well as exercises for cervical ROM and posterior shoulder girdle strengthening. Encouraged work on posture and alignment and exercises, keeping chin tucked to avoid extension at higher level cervical spine. Patient will continue to work onTB exercises consistently.   EVAL: Patient is a 77 y.o. female who was seen today for physical therapy evaluation and treatment for cervicalgia. She has a history of ACDF C5/6 1996 and intermittent cervical pain and dysfunction since that time. Patient reports irritation of cervical symptoms with yard work ~ 4 months ago. She has muscular tightness and pain with movement. Symptoms are worse first thing in the morning and late day. She has some headaches related to tightness in the neck. Patient presents with poor posture and alignment; limited cervical and thoracic/shoulder ROM/mobility; muscular tightness to palpation; postural weakness.  OBJECTIVE IMPAIRMENTS:  ACDF C5/6 1996 and intermittent cervical pain and dysfunction since that time. Patient reports irritation of cervical symptoms with yard work ~ 4 months ago. She has muscular tightness and pain with movement. Symptoms are worse first thing in the morning and late day. She has some headaches related to tightness in the neck. Patient presents with poor posture and alignment; limited cervical and thoracic/shoulder ROM/mobility; muscular tightness to palpation; postural weaknessdecreased activity tolerance, decreased mobility, decreased ROM, decreased strength, increased fascial restrictions, increased muscle spasms, impaired flexibility, improper body mechanics, postural dysfunction, and pain.    GOALS: Goals reviewed with patient?  Yes  SHORT TERM GOALS: Target date: 09/21/2022  Independent in  initial HEP  Baseline:  Goal status: met  2.  75% decrease in frequency and intensity of headaches  Baseline:  Goal status: met   LONG TERM GOALS: Target date: 11/02/2022   Decrease cervical pain by 75-100% allowing patient to return to all normal functional activities  Baseline:  Goal status: on going   2.  Increase cervical ROM to WFL's with no pain  Baseline:  Goal status: on going   3.  Improve posture and alignment with patient to demonstrate improved upright posture with posterior shoulder girdle engaged  Baseline:  Goal status: on going   4.  5/5 strength posterior shoulder girdle in middle and lower traps  Baseline:  Goal status: on going   5.  Independent in HEP  Baseline:  Goal status:on going   6.  Improve functional limitation score to 56 Baseline: 44 10/02/22: 53 Goal status:on going    PLAN:  PT FREQUENCY: 2x/week  PT DURATION: 12 weeks  PLANNED INTERVENTIONS: Therapeutic exercises, Therapeutic activity, Neuromuscular re-education, Balance training, Gait training, Patient/Family education, Self Care, Joint mobilization, Aquatic Therapy, Dry Needling, Electrical stimulation, Spinal mobilization, Cryotherapy, Moist heat, Taping, Ultrasound, Ionotophoresis 4mg /ml Dexamethasone, Manual therapy, and Re-evaluation  PLAN FOR NEXT SESSION: review and progress exercises program; continue with spine care and body mechanics education; manual work, DN, modalities as indicated    [employer], PT 10/23/2022, 11:02 AM

## 2022-10-23 NOTE — Patient Instructions (Signed)
Medication Instructions:   Your physician recommends that you continue on your current medications as directed. Please refer to the Current Medication list given to you today.  *If you need a refill on your cardiac medications before your next appointment, please call your pharmacy*    Follow-Up: At [facility], you and your health needs are our priority.  As part of our continuing mission to provide you with exceptional heart care, we have created designated Provider Care Teams.  These Care Teams include your primary Cardiologist (physician) and Advanced Practice Providers (APPs -  Physician Assistants and Nurse Practitioners) who all work together to provide you with the care you need, when you need it.  We recommend signing up for the patient portal called "MyChart".  Sign up information is provided on this After Visit Summary.  MyChart is used to connect with patients for Virtual Visits (Telemedicine).  Patients are able to view lab/test results, encounter notes, upcoming appointments, etc.  Non-urgent messages can be sent to your provider as well.   To learn more about what you can do with MyChart, go to [email or web address].    Your next appointment:   1 year(s)  Provider:   Philip Nahser, MD       

## 2022-10-25 ENCOUNTER — Encounter: Payer: Medicare Other | Admitting: Rehabilitative and Restorative Service Providers"

## 2022-10-26 ENCOUNTER — Encounter: Payer: Medicare Other | Admitting: Rehabilitative and Restorative Service Providers"

## 2022-10-26 ENCOUNTER — Ambulatory Visit: Payer: Medicare Other | Admitting: Cardiovascular Disease

## 2022-10-27 ENCOUNTER — Ambulatory Visit: Payer: Medicare Other | Attending: Cardiology | Admitting: Pharmacist Clinician (PhC)/ Clinical Pharmacy Specialist

## 2022-10-27 ENCOUNTER — Encounter: Payer: Self-pay | Admitting: Pharmacist Clinician (PhC)/ Clinical Pharmacy Specialist

## 2022-10-27 DIAGNOSIS — I10 Essential (primary) hypertension: Secondary | ICD-10-CM | POA: Diagnosis not present

## 2022-10-27 DIAGNOSIS — E782 Mixed hyperlipidemia: Secondary | ICD-10-CM | POA: Insufficient documentation

## 2022-10-27 LAB — BASIC METABOLIC PANEL
BUN/Creatinine Ratio: 10 — ABNORMAL LOW (ref 12–28)
BUN: 10 mg/dL (ref 8–27)
CO2: 28 mmol/L (ref 20–29)
Calcium: 10.3 mg/dL (ref 8.7–10.3)
Chloride: 94 mmol/L — ABNORMAL LOW (ref 96–106)
Creatinine, Ser: 0.97 mg/dL (ref 0.57–1.00)
Glucose: 79 mg/dL (ref 70–99)
Potassium: 5.3 mmol/L — ABNORMAL HIGH (ref 3.5–5.2)
Sodium: 134 mmol/L (ref 134–144)
eGFR: 60 mL/min/{1.73_m2} (ref >59)

## 2022-10-27 NOTE — Assessment & Plan Note (Addendum)
Assessment: Patient with CAD not at LDL goal of < 70 Most recent LDL 106 on 10/13/22 Not able to tolerate statins secondary to myalgias - rosuvastatin, pravastatin Currently has had 2 doses of Leqvio, with little change to LDL  Unable to afford PCSK9i or bempedoic acid 2/2 brand deductible on insurance Patient notes she previously did well with red yeast rice  Plan: Patient agreeable to starting red yeast rice and continuing Leqvio for at least another dose.  Hope is that combination will get LDL down.   Discussed using low dose rosuvastatin (5 mg once to twice weekly) but she would prefer to try the RYR Concerned about idea that Leqvio might raise blood sugar or affect kidneys - will get BMET today to reassure.  Repeat labs after next Leqvio dose (March 01, 2023) Lipid Liver function

## 2022-10-27 NOTE — Progress Notes (Unsigned)
Office Visit    Patient Name: Wendy [name] Date of Encounter: 10/27/2022  Primary Care Provider:  Adrian Prince, MD Primary Cardiologist:  Kristeen Miss, MD  Chief Complaint    Hyperlipidemia   Significant Past Medical History   CAD CAC 103 = 58th percentile  HTN Mostly controlled, on valsartan, sprionolactone, metoprolol     Allergies  Allergen Reactions   Hctz [Hydrochlorothiazide] Other (See Comments)    hyponatremia   Codeine Nausea Only and Nausea And Vomiting   Hydrocodone-Acetaminophen Nausea And Vomiting   Lisinopril Other (See Comments)    REACTION: INTOL to Prinivil w/ pain on urination    Neomycin Dermatitis   Other Nausea Only    fragrance   Oxycodone Nausea And Vomiting   Pravastatin Sodium Other (See Comments)    REACTION: INTOL to Pravachol w/ myalgias   Ciprofloxacin Other (See Comments)    History of Present Illness    Wendy [name] is a 77 y.o. female patient of Dr Elease Hashimoto, in the office today to discuss options for cholesterol management.   She was previously seen by Laural Golden PharmD and started on Leqvio injections.  She has now had 2 (March 20, July 1) and has tolerated these well, however we have not seen the drop we would hope for.   She was unable to afford Repatha or Praulent because of an insurance deductible.  Nexlizet would have the same challenge.  Previously she noted that she took a red yeast rice supplement by [employer] that did show some benefit to her LDL.     Insurance Carrier: [employer], Rx   LDL Cholesterol goal:  LDL < 70  Current Medications:   Inclisiran - 2nd dose 08/28/22  Previously tried:  rosuvastatin, pravastatin - myalgias  Diet:   is working to cut back on starchy foods, increase oatmeal and nuts    Accessory Clinical Findings   Lab Results  Component Value Date   CHOL 188 10/13/2022   HDL 49 10/13/2022   LDLCALC 106 (H) 10/13/2022   LDLDIRECT 140.8 03/27/2011   TRIG 188 (H) 10/13/2022    CHOLHDL 3.8 10/13/2022    No results found for: "LIPOA"  Lab Results  Component Value Date   ALT 30 10/13/2022   AST 27 10/13/2022   ALKPHOS 52 10/13/2022   BILITOT 0.7 10/13/2022   Lab Results  Component Value Date   CREATININE 0.88 04/28/2022   BUN 13 04/28/2022   NA 134 04/28/2022   K 5.0 04/28/2022   CL 95 (L) 04/28/2022   CO2 24 04/28/2022   No results found for: "HGBA1C"  Home Medications    Current Outpatient Medications  Medication Sig Dispense Refill   ALPRAZolam (XANAX) 0.25 MG tablet Take 1 tablet by mouth daily as needed.     ascorbic acid (VITAMIN C) 500 MG tablet Take 1 tablet (500 mg total) by mouth daily. 90 tablet 0   aspirin 81 MG tablet Take 81 mg by mouth daily.     b complex vitamins tablet Take 1 tablet by mouth daily.     Cholecalciferol (VITAMIN D) 2000 UNITS CAPS Take 1 capsule by mouth daily.     Coenzyme Q10 (COQ10) 200 MG CAPS Take 1 capsule by mouth daily.     cyclobenzaprine (FLEXERIL) 10 MG tablet Take 10 mg by mouth daily as needed.     DIGEST ENZYMES-ANTICHOLINERGIC PO Take 1 capsule by mouth 3 (three) times daily. PRN     hydrALAZINE (  APRESOLINE) 10 MG tablet Take 1 tablet (10 mg total) by mouth 3 (three) times daily. (Patient not taking: Reported on 10/23/2022) 90 tablet 0   metoprolol succinate (TOPROL-XL) 25 MG 24 hr tablet TAKE 1 TABLET (25 MG TOTAL) BY MOUTH DAILY. 90 tablet 2   Misc Natural Products (TART CHERRY ADVANCED) CAPS Take 2 capsules by mouth daily.     Multiple Vitamins-Minerals (MULTIVITAMIN & MINERAL PO) Take 1 tablet by mouth daily.     Omega-3 Fatty Acids (FISH OIL PO) Take 1 capsule by mouth daily.     spironolactone (ALDACTONE) 25 MG tablet Take 1 tablet (25 mg total) by mouth daily. 90 tablet 3   valsartan (DIOVAN) 80 MG tablet Take 1 tablet (80 mg total) by mouth 2 (two) times daily. 180 tablet 3   zinc sulfate 220 (50 Zn) MG capsule Take 1 capsule by mouth daily.     No current facility-administered medications for  this visit.     Assessment & Plan    Mixed hyperlipidemia Assessment: Patient with CAD not at LDL goal of < 70 Most recent LDL 106 on 10/13/22 Not able to tolerate statins secondary to myalgias - rosuvastatin, pravastatin Currently has had 2 doses of Leqvio, with little change to LDL  Unable to afford PCSK9i or bempedoic acid 2/2 brand deductible on insurance Patient notes she previously did well with red yeast rice  Plan: Patient agreeable to starting red yeast rice and continuing Leqvio for at least another dose.  Hope is that combination will get LDL down.   Discussed using low dose rosuvastatin (5 mg once to twice weekly) but she would prefer to try the RYR Concerned about idea that Leqvio might raise blood sugar or affect kidneys - will get BMET today to reassure.  Repeat labs after next Leqvio dose (March 01, 2023) Lipid Liver function    Phillips Hay, PharmD CPP [facility] [address]  [place], [place] [address] [phone]  10/27/2022, 11:37 AM

## 2022-10-27 NOTE — Patient Instructions (Addendum)
Your Results:             Your most recent labs Goal  Total Cholesterol 188 < 200  Triglycerides 188 < 150  HDL (happy/good cholesterol) 49 > 40  LDL (lousy/bad cholesterol 106 < 70   Medication changes:  Continue to to the Leqvio shots - your next is scheduled for March 01, 2023  Start the red-yeast rice that you previously took.    Lab orders:  We want to repeat labs one month after your next injection - so around the first of February.  We will send you a lab order to remind you once we get closer to that time.     Thank you for choosing [facility]   High Triglycerides Eating Plan Triglycerides are a type of fat in the blood. High levels of triglycerides can increase your risk of heart disease and stroke. If your triglyceride levels are high, choosing the right foods can help lower your triglycerides and keep your heart healthy. Work with your health care provider or a dietitian to develop an eating plan that is right for you. What are tips for following this plan? General guidelines  Lose weight, if you are overweight. For most people, losing 5-10 lb (2-5 kg) helps lower triglyceride levels. A weight-loss plan may include: 30 minutes of exercise at least 5 days a week. Reducing the amount of calories, sugar, and fat you eat. Eat a wide variety of fresh fruits, vegetables, and whole grains. These foods are high in fiber. Eat foods that contain healthy fats, such as fatty fish, nuts, seeds, and olive oil. Avoid foods that are high in added sugar, added salt (sodium), and saturated fat. Avoid low-fiber, refined carbohydrates such as white bread, crackers, noodles, and white rice. Avoid foods with trans fats or partially hydrogenated oils, such as fried foods or stick margarine. If you drink alcohol: Limit how much you have to: 0-1 drink a day for women who are not pregnant. 0-2 drinks a day for men. Your health care provider may recommend that you drink less than these  amounts depending on your overall health. Know how much alcohol is in a drink. In the [place] one drink equals one 12 oz bottle of beer (355 mL), one 5 oz glass of wine (148 mL), or one 1 oz glass of hard liquor (44 mL). Reading food labels Check food labels for: The amount of saturated fat. Choose foods with no or very little saturated fat (less than 2 g). The amount of trans fat. Choose foods with no transfat. The amount of cholesterol. Choose foods that are low in cholesterol. The amount of sodium. Choose foods with less than 140 milligrams (mg) per serving. Shopping Buy dairy products labeled as nonfat (skim) or low-fat (1%). Avoid buying processed or prepackaged foods. These are often high in added sugar, sodium, and fat. Cooking Choose healthy fats when cooking, such as olive oil, avocado oil, or canola oil. Cook foods using lower fat methods, such as baking, broiling, boiling, or grilling. Make your own sauces, dressings, and marinades when possible, instead of buying them. Store-bought sauces, dressings, and marinades are often high in sodium and sugar. Meal planning Eat more home-cooked food and less restaurant, buffet, and fast food. Eat fatty fish at least 2 times each week. Examples of fatty fish include salmon, trout, sardines, mackerel, tuna, and herring. If you eat whole eggs, do not eat more than 4 egg yolks per week. What foods should I eat? Fruits  All fresh, canned (in natural juice), or frozen fruits. Vegetables Fresh or frozen vegetables. Low-sodium canned vegetables. Grains Whole wheat or whole grain breads, crackers, cereals, and pasta. Unsweetened oatmeal. Bulgur. Barley. Quinoa. Brown rice. Whole wheat flour tortillas. Meats and other proteins Skinless chicken or [place]. Ground chicken or [place]. Lean cuts of pork, trimmed of fat. Fish and seafood, especially salmon, trout, and herring. Egg whites. Dried beans, peas, or lentils. Unsalted nuts or seeds. Unsalted  canned beans. Natural peanut or almond butter or other nut butters. Dairy Low-fat dairy products. Skim or low-fat (1%) milk. Reduced fat (2%) and low-sodium cheese. Low-fat ricotta cheese. Low-fat cottage cheese. Plain, low-fat yogurt. Fats and oils Tub margarine without trans fats. Light or reduced-fat mayonnaise. Light or reduced-fat salad dressings. Avocado. Safflower, olive, sunflower, soybean, and canola oils. The items listed above may not be a complete list of recommended foods and beverages. Talk with your dietitian about what dietary choices are best for you. What foods should I avoid? Fruits Sweetened dried fruit. Canned fruit in syrup. Fruit juice. Vegetables Creamed or fried vegetables. Vegetables in a cheese sauce. Grains White bread. White (regular) pasta. White rice. Cornbread. Bagels. Pastries. Crackers that contain trans fat. Meats and other proteins Fatty cuts of meat. Ribs. Chicken wings. Tomasa Blase. Sausage. Bologna. Salami. Chitterlings. Fatback. Hot dogs. Bratwurst. Packaged lunch meats. Dairy Whole or reduced-fat (2%) milk. Half-and-half. Cream cheese. Full-fat or sweetened yogurt. Full-fat cheese. Nondairy creamers. Whipped toppings. Processed cheese or cheese spreads. Cheese curds. Fats and oils Butter. Stick margarine. Lard. Shortening. Ghee. Bacon fat. Tropical oils, such as coconut, palm kernel, or palm oils. Beverages Alcohol. Sweetened drinks, such as soda, lemonade, fruit drinks, or punches. Sweets and desserts Corn syrup. Sugars. Honey. Molasses. Candy. Jam and jelly. Syrup. Sweetened cereals. Cookies. Pies. Cakes. Donuts. Muffins. Ice cream. Condiments Store-bought sauces, dressings, and marinades that are high in sugar, such as ketchup and barbecue sauce. The items listed above may not be a complete list of foods and beverages you should avoid. Talk with your dietitian about what dietary choices are best for you. Summary High levels of triglycerides can  increase the risk of heart disease and stroke. Choosing the right foods can help lower your triglycerides. Eat plenty of fresh fruits, vegetables, and whole grains. Choose low-fat dairy and lean meats. Eat fatty fish at least twice a week. Avoid processed and prepackaged foods with added sugar, sodium, saturated fat, and trans fat. If you need suggestions or have questions about what types of food are good for you, talk with your health care provider or a dietitian. This information is not intended to replace advice given to you by your health care provider. Make sure you discuss any questions you have with your health care provider. Document Revised: 06/25/2020 Document Reviewed: 06/25/2020 Elsevier Patient Education  2024 [employer].

## 2022-10-31 ENCOUNTER — Telehealth: Payer: Self-pay | Admitting: Cardiovascular Disease

## 2022-10-31 ENCOUNTER — Encounter: Payer: Medicare Other | Admitting: Rehabilitative and Restorative Service Providers"

## 2022-10-31 NOTE — Telephone Encounter (Signed)
Patient returned call from this morning regarding labs. Potassium elevated at 5.3. Patient is not taking any potassium supplements.   Per protocol: instructed patient to hold spironolactone and valsartan. Dr. Elease Hashimoto will review labs and we will call with his recommendations.  Patient verbalized understanding.

## 2022-10-31 NOTE — Telephone Encounter (Signed)
Pt returning call for lab results  

## 2022-11-02 ENCOUNTER — Encounter: Payer: Medicare Other | Admitting: Rehabilitative and Restorative Service Providers"

## 2022-11-02 ENCOUNTER — Telehealth: Payer: Self-pay

## 2022-11-02 DIAGNOSIS — E875 Hyperkalemia: Secondary | ICD-10-CM

## 2022-11-02 NOTE — Telephone Encounter (Signed)
-----   Message from Kristeen Miss sent at 11/01/2022  9:43 PM EDT ----- Potassium is mildly elevated.  She may resume her spironolactone and valsartan  Please her her reduce the intake of her foods that are high in potassium  Recheck BMP in 2 weeks

## 2022-11-02 NOTE — Telephone Encounter (Signed)
Called and spoke with patient who states she never stopped her spironolactone and valsartan, stating she was too afraid to. She states she's been eating a lot of tomato sandwiches since she grows a garden. She will cut back on this. Repeat blood work ordered for 11/17/22-she will get done at [facility].

## 2022-11-06 ENCOUNTER — Encounter: Payer: Medicare Other | Admitting: Rehabilitative and Restorative Service Providers"

## 2022-11-09 ENCOUNTER — Ambulatory Visit: Payer: Medicare Other | Attending: Neurological Surgery | Admitting: Rehabilitative and Restorative Service Providers"

## 2022-11-09 ENCOUNTER — Encounter: Payer: Self-pay | Admitting: Rehabilitative and Restorative Service Providers"

## 2022-11-09 DIAGNOSIS — R29898 Other symptoms and signs involving the musculoskeletal system: Secondary | ICD-10-CM | POA: Insufficient documentation

## 2022-11-09 DIAGNOSIS — R293 Abnormal posture: Secondary | ICD-10-CM | POA: Diagnosis not present

## 2022-11-09 DIAGNOSIS — M6281 Muscle weakness (generalized): Secondary | ICD-10-CM | POA: Insufficient documentation

## 2022-11-09 DIAGNOSIS — M542 Cervicalgia: Secondary | ICD-10-CM | POA: Insufficient documentation

## 2022-11-09 NOTE — Therapy (Signed)
OUTPATIENT PHYSICAL THERAPY CERVICAL TREATMENT       Patient Name: Wendy [name] MRN: [id number] DOB:1945/09/24, 77 y.o., female Today's Date: 11/09/2022  END OF SESSION:  PT End of Session - 11/09/22 0939     Visit Number 15    Number of Visits 30    Date for PT Re-Evaluation 12/28/22    Authorization Type medicare + supplemental    Progress Note Due on Visit 20    PT Start Time 0930    PT Stop Time 1019    PT Time Calculation (min) 49 min    Activity Tolerance Patient tolerated treatment well             Past Medical History:  Diagnosis Date   Anxiety    Atypical chest pain    B12 deficiency    Cervical spondylosis without myelopathy    Diverticulosis of colon    Dysesthesia    GERD (gastroesophageal reflux disease)    Hypercholesteremia    Hypertension    IBS (irritable bowel syndrome)    Malignant melanoma (HCC)    Vitreous hemorrhage (HCC)    Past Surgical History:  Procedure Laterality Date   anterior cervical discectomy and fusion  1996   Dr. 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   Chronic cystitis 01/18/2022   Degeneration of lumbar intervertebral disc 01/18/2022   Lumbar spondylosis 01/18/2022   Lumbar radiculopathy 01/18/2022   Constipation 01/18/2022   Myalgia due to statin 01/18/2022   Aortic atherosclerosis (HCC) 01/18/2022   Hyponatremia 04/30/2021   Weakness 04/30/2021   COVID-19 virus infection 04/30/2021   Pain in wrist 01/16/2019   Neck pain 01/10/2019   Impacted cerumen of both ears 08/07/2018    Eustachian tube dysfunction, bilateral 08/07/2018   Mixed hyperlipidemia 03/30/2016   Vaginitis, atrophic 05/26/2015   Carotid artery disease (HCC) 02/15/2015   Voiding dysfunction 12/11/2014   Third degree hemorrhoids 10/13/2014   Procidentia of uterus 09/23/2014   Occlusion and stenosis of carotid artery without mention of cerebral infarction 09/02/2013   Bilateral dry eyes 07/14/2012   Actinic keratoses 05/25/2012   Nuclear cataract, nonsenile 12/25/2011   Other seborrheic keratosis 09/18/2011   History of malignant melanoma of skin 05/11/2011   OBSTRUCTIVE SLEEP APNEA 11/19/2009   SNORING 10/11/2009   Abnormal respiratory rate 10/11/2009   LACERATION OF FINGER 09/03/2009   DYSESTHESIA 05/23/2008   MALIGNANT MELANOMA 01/13/2008   ANXIETY 01/13/2008   VITREOUS HEMORRHAGE 01/13/2008   HTN (hypertension) 01/13/2008   DIVERTICULOSIS OF COLON 01/13/2008   IRRITABLE BOWEL SYNDROME 01/13/2008   CERVICAL SPONDYLOSIS WITHOUT MYELOPATHY 01/13/2008   CHEST PAIN, ATYPICAL 01/13/2008   HYPERCHOLESTEROLEMIA 01/10/2008   GERD 01/10/2008    PCP: Dr Adrian Prince  REFERRING PROVIDER: Dr Barnett Abu   REFERRING DIAG: Cervicalgia  THERAPY DIAG:  Cervicalgia  Other symptoms and signs involving the musculoskeletal system  Muscle weakness (generalized)  Abnormal posture  Rationale for Evaluation and Treatment: Rehabilitation  ONSET DATE: 04/13/22  SUBJECTIVE:  SUBJECTIVE STATEMENT: Patient reports that she is improving. She still has some days that are better than others. She is walking every day and has been working on her exercises at home more including with therabands.   EVAL: increase in neck pain following yard work ~ 3-4 months ago. She is better with Advil but it is worse in the  evening. She has some degenerative changes in the cervical spine.  Hand dominance: Right  PERTINENT HISTORY:  HTN; cervical sx with fusion 1996; arthritis; melanoma   PAIN:  Are you having pain? Yes: NPRS scale: 0-3/10(stiff - not pain) Pain location: posterior cervical spine  Pain description: tightness; nagging; into head; stiffness  Aggravating factors: worse first thing in the morning and toward the end of the day; activities  Relieving factors: OTC antiinflammatory; moving   PRECAUTIONS: None  WEIGHT BEARING RESTRICTIONS: No  FALLS:  Has patient fallen in last 6 months? 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 Cervical spondylosis, slightly progressed at the C3-4 and C4-5 levels. 2. Mild-moderate canal stenosis at C4-5 with moderate right foraminal stenosis. 3. Mild-moderate right foraminal stenosis at C3-4. 4. Prior C5-C7 ACDF with solid arthrodesis.  PATIENT SURVEYS:  FOTO 44; goal 56 10/02/22: 53  SENSATION: Intermittent tingling in Rt hand   POSTURE: Patient presents with head forward posture with increased thoracic kyphosis; shoulders rounded and elevated; scapulae abducted and rotated along the thoracic spine; head of the humerus anterior in orientation.   PALPATION: Muscular tightness in ant/lat/post cervical musculature; pecs; upper trap; leveator  10/02/22: persistent tightness deeper layers of cervical musculature into occipital area   CERVICAL ROM:   Active ROM A/PROM (deg) eval AROM 10/02/2022 AROM 10/23/22 AROM 11/09/22  Flexion 41 42 52 52  Extension 40 tight; pain 49 mild tightness 54 54  Right lateral flexion 25 tight  22 tight 23 tight 23  tight   Left lateral flexion 26 tight 26 tight 26 tight 26 tight   Right rotation 51 tight 56 tight  58 tight  57 tight   Left rotation 27 tight  48 tight  48 tight  48 tight    (Blank rows = not tested)  UPPER EXTREMITY STRENGTH: no pain with resistive testing   Active ROM Right eval Left eval  Shoulder flexion 5 5  Shoulder extension 5 5  Shoulder abduction 5 5  Shoulder adduction    Shoulder extension    Shoulder internal rotation    Shoulder external rotation    Middle trap  4 4  Lower trap  4 4  Wrist flexion    Wrist extension    Wrist ulnar deviation    Wrist radial deviation    Wrist pronation    Wrist supination     (Blank rows = not tested)  UPPER EXTREMITY ROM: end range tightness as noted   MMT Right eval Left eval  Shoulder flexion Tight  Tight   Shoulder extension    Shoulder abduction Tight  Tight   Shoulder adduction    Shoulder extension    Shoulder internal rotation Tight  Tight   Shoulder external rotation    Middle trapezius    Lower trapezius  Elbow flexion    Elbow extension    Wrist flexion    Wrist extension    Wrist ulnar deviation    Wrist radial deviation    Wrist pronation    Wrist supination    Grip strength     (Blank rows = not tested)  CERVICAL SPECIAL TESTS:  Upper limb tension test (ULTT): Positive - tight R > L   [facility] Adult PT Treatment:           DATE: 11/09/22 Therapeutic Exercise: Supine  Chin tuck 10 sec x 10  Standing  UBE L4 x4 in alt fwd/back  Axial extension/chin tuck 5 sec x 10 Doorway stretch 3 positions 30 sec x 2  L's w/noodle along spine yellow TB 3 sec x 10 (HEP) W's w/noodle along spine yellow TB 3 sec x 10 (HEP) Row green TB 3 sec x 20 (HEP) Shoulder extension green TB 3 sec x 20 (HEP) Sitting  Chin tuck 5 sec x 5 Lateral cervical flexion 5 sec x 5 R/L Cervical rotation 3 sec x 5 R/L  Manual Therapy: STM through ant/lat/post cervical musculature pt supine  PROM/stretching cervical spine into  cervical flexion and flexion with slight rotation  Neuromuscular re-ed: Working on posture and alignment/position of head and thoracic spine in sitting and standing Education re- importance of upright posture with improved strength in posterior shoulder girdle musculature  Modalities: Moist heat cervical spine, ice LB  Does not like TENS   [facility] Adult PT Treatment:    (dry needling)       DATE: 10/23/22 Therapeutic Exercise: Supine  Chin tuck 10 sec x 10  Standing  UBE L5 x4 in alt fwd/back  Axial extension/chin tuck 5 sec x 10 trial of ball and then pillow behind head for chin tuck  Doorway stretch 3 positions 30 sec x 2  L's w/noodle along spine yellow TB 3 sec x 10  W's w/noodle along spine yellow TB 3 sec x 10  Row green TB 3 sec x 20  Shoulder extension green TB 3 sec x 20  Sitting  Chin tuck 5 sec x 5 Lateral cervical flexion 5 sec x 5 R/L Cervical rotation 3 sec x 5 R/L  Manual Therapy: STM through ant/lat/post cervical musculature pt supine  PROM/stretching cervical spine into cervical flexion and flexion with slight rotation  Neuromuscular re-ed: Working on posture and alignment/position of head and thoracic spine in sitting and standing Education re- importance of upright posture with improved strength in posterior shoulder girdle musculature  Looked at musculature involved on line and explained the interaction of pecs and upper traps Modalities: Moist heat cervical spine, ice LB  Does not like TENS    PATIENT EDUCATION:  Education details: POC; HEP Person educated: Patient Education method: Programmer, multimedia, Demonstration, Actor cues, Verbal cues, and Handouts Education comprehension: verbalized understanding, returned demonstration, verbal cues required, tactile cues required, and needs further education  HOME EXERCISE PROGRAM: Access Code: [email or web address] URL: [email or web address] Date: 09/11/2022 Prepared by: Corlis Leak  Exercises - Supine Cervical  Retraction with Towel  - 2 x daily - 7 x weekly - 1 sets - 5-10 reps - 10 sec  hold - Seated Cervical Retraction  - 2 x daily - 7 x weekly - 1-2 sets - 5-10 reps - 10 sec  hold - Seated Scapular Retraction  - 2 x daily - 7 x weekly - 1-2 sets - 10 reps - 10 sec  hold - Doorway Pec Stretch at 60 Degrees  Abduction  - 3 x daily - 7 x weekly - 1 sets - 3 reps - Doorway Pec Stretch at 90 Degrees Abduction  - 3 x daily - 7 x weekly - 1 sets - 3 reps - 30 seconds  hold - Doorway Pec Stretch at 120 Degrees Abduction  - 3 x daily - 7 x weekly - 1 sets - 3 reps - 30 second hold  hold - Shoulder External Rotation and Scapular Retraction with Resistance  - 2 x daily - 7 x weekly - 1 sets - 10 reps - 3-5 sec  hold - Shoulder W - External Rotation with Resistance  - 2 x daily - 7 x weekly - 1-2 sets - 10 reps - 3 sec  hold - Standing Bilateral Low Shoulder Row with Anchored Resistance  - 2 x daily - 7 x weekly - 1-3 sets - 10 reps - 2-3 sec  hold - Shoulder extension with resistance - Neutral  - 1 x daily - 7 x weekly - 1-2 sets - 10 reps - 3-5 sec  hold - Drawing Bow  - 1 x daily - 7 x weekly - 1 sets - 10 reps - 3 sec  hold Patient Education - Trigger Point Dry Needling  ASSESSMENT:  CLINICAL IMPRESSION: Continued gradual improvement in tightness and pain in the neck and shoulder. Patient has been more consistent with HEP. Improving symptoms with decreased tightness and discomfort. Patient demonstrates some increasing cervical ROM with the exception of lateral cervical flexion which remains the most limited cervical range and continues to feel tight. Focus on STM and myofacial release to address increased pain and tightness as well as exercises for cervical ROM and posterior shoulder girdle strengthening. Encouraged work on posture and alignment and exercises, keeping chin tucked to avoid extension at higher level cervical spine. Patient will continue to work onTB exercises consistently.   EVAL: Patient is a  77 y.o. female who was seen today for physical therapy evaluation and treatment for cervicalgia. She has a history of ACDF C5/6 1996 and intermittent cervical pain and dysfunction since that time. Patient reports irritation of cervical symptoms with yard work ~ 4 months ago. She has muscular tightness and pain with movement. Symptoms are worse first thing in the morning and late day. She has some headaches related to tightness in the neck. Patient presents with poor posture and alignment; limited cervical and thoracic/shoulder ROM/mobility; muscular tightness to palpation; postural weakness.  OBJECTIVE IMPAIRMENTS:  ACDF C5/6 1996 and intermittent cervical pain and dysfunction since that time. Patient reports irritation of cervical symptoms with yard work ~ 4 months ago. She has muscular tightness and pain with movement. Symptoms are worse first thing in the morning and late day. She has some headaches related to tightness in the neck. Patient presents with poor posture and alignment; limited cervical and thoracic/shoulder ROM/mobility; muscular tightness to palpation; postural weaknessdecreased activity tolerance, decreased mobility, decreased ROM, decreased strength, increased fascial restrictions, increased muscle spasms, impaired flexibility, improper body mechanics, postural dysfunction, and pain.    GOALS: Goals reviewed with patient? Yes  SHORT TERM GOALS: Target date: 09/21/2022  Independent in initial HEP  Baseline:  Goal status: met  2.  75% decrease in frequency and intensity of headaches  Baseline:  Goal status: met   LONG TERM GOALS: Target date: 12/28/2022   Decrease cervical pain by 75-100% allowing patient to return to all normal functional activities  Baseline:  Goal status: on going   2.  Increase cervical ROM to WFL's with no pain  Baseline:  Goal status: partially met   3.  Improve posture and alignment with patient to demonstrate improved upright posture with  posterior shoulder girdle engaged  Baseline:  Goal status: partially met   4.  5/5 strength posterior shoulder girdle in middle and lower traps  Baseline:  Goal status: on going   5.  Independent in HEP  Baseline:  Goal status:on going   6.  Improve functional limitation score to 56 Baseline: 44 10/02/22: 53 Goal status:on going    PLAN:  PT FREQUENCY: 2x/week  PT DURATION: 12 weeks  PLANNED INTERVENTIONS: Therapeutic exercises, Therapeutic activity, Neuromuscular re-education, Balance training, Gait training, Patient/Family education, Self Care, Joint mobilization, Aquatic Therapy, Dry Needling, Electrical stimulation, Spinal mobilization, Cryotherapy, Moist heat, Taping, Ultrasound, Ionotophoresis 4mg /ml Dexamethasone, Manual therapy, and Re-evaluation  PLAN FOR NEXT SESSION: review and progress exercises program; continue with spine care and body mechanics education; manual work, DN, modalities as indicated    [employer], PT 11/09/2022, 12:47 PM

## 2022-11-13 ENCOUNTER — Telehealth: Payer: Self-pay | Admitting: Cardiovascular Disease

## 2022-11-13 DIAGNOSIS — H6123 Impacted cerumen, bilateral: Secondary | ICD-10-CM | POA: Diagnosis not present

## 2022-11-13 DIAGNOSIS — H903 Sensorineural hearing loss, bilateral: Secondary | ICD-10-CM | POA: Diagnosis not present

## 2022-11-13 NOTE — Telephone Encounter (Signed)
Pt states she would like to speak with Belenda Cruise the pharmacist. Please advise

## 2022-11-13 NOTE — Telephone Encounter (Signed)
Not much change in LDL after starting Leqvio.   First dose 05/17/22 (LDL at 100 on 3.1.24).  Second dose 08/28/22 and labs 7/22 have LDL at 110, a month later at 106.  Reviewed with patent, will continue for 3rd dose, scheduled for 03/01/23 and check labs at that time.  If no significant change, will look at other options

## 2022-11-17 ENCOUNTER — Other Ambulatory Visit
Admission: RE | Admit: 2022-11-17 | Discharge: 2022-11-17 | Disposition: A | Discharge status: Home / Self Care | Payer: Medicare Other | Attending: Cardiovascular Disease | Admitting: Cardiovascular Disease

## 2022-11-17 DIAGNOSIS — E875 Hyperkalemia: Secondary | ICD-10-CM | POA: Diagnosis not present

## 2022-11-17 LAB — BASIC METABOLIC PANEL
Anion gap: 13 (ref 5–15)
BUN: 14 mg/dL (ref 8–23)
CO2: 26 mmol/L (ref 22–32)
Calcium: 9.5 mg/dL (ref 8.9–10.3)
Chloride: 94 mmol/L — ABNORMAL LOW (ref 98–111)
Creatinine, Ser: 1.01 mg/dL — ABNORMAL HIGH (ref 0.44–1.00)
GFR, Estimated: 57 mL/min — ABNORMAL LOW (ref >60)
Glucose, Bld: 105 mg/dL — ABNORMAL HIGH (ref 70–99)
Potassium: 4.9 mmol/L (ref 3.5–5.1)
Sodium: 133 mmol/L — ABNORMAL LOW (ref 135–145)

## 2022-11-22 ENCOUNTER — Ambulatory Visit: Payer: Medicare Other | Admitting: Rehabilitative and Restorative Service Providers"

## 2022-11-28 ENCOUNTER — Ambulatory Visit: Payer: Medicare Other | Admitting: Rehabilitative and Restorative Service Providers"

## 2022-11-29 ENCOUNTER — Ambulatory Visit: Payer: Medicare Other | Attending: Endocrinology | Admitting: Rehabilitative and Restorative Service Providers"

## 2022-11-29 ENCOUNTER — Encounter: Payer: Self-pay | Admitting: Rehabilitative and Restorative Service Providers"

## 2022-11-29 DIAGNOSIS — M47892 Other spondylosis, cervical region: Secondary | ICD-10-CM | POA: Diagnosis not present

## 2022-11-29 DIAGNOSIS — M4802 Spinal stenosis, cervical region: Secondary | ICD-10-CM | POA: Insufficient documentation

## 2022-11-29 DIAGNOSIS — M6281 Muscle weakness (generalized): Secondary | ICD-10-CM | POA: Diagnosis not present

## 2022-11-29 DIAGNOSIS — R293 Abnormal posture: Secondary | ICD-10-CM | POA: Diagnosis not present

## 2022-11-29 DIAGNOSIS — M542 Cervicalgia: Secondary | ICD-10-CM | POA: Insufficient documentation

## 2022-11-29 DIAGNOSIS — R29898 Other symptoms and signs involving the musculoskeletal system: Secondary | ICD-10-CM

## 2022-11-29 NOTE — Therapy (Signed)
OUTPATIENT PHYSICAL THERAPY CERVICAL TREATMENT       Patient Name: Wendy [name] MRN: [id number] DOB:[birth year], 77 y.o., female Today's Date: 11/29/2022  END OF SESSION:  PT End of Session - 11/29/22 0851     Visit Number 16    Number of Visits 30    Date for PT Re-Evaluation 12/28/22    Authorization Type medicare + supplemental    Progress Note Due on Visit 20    PT Start Time 0849    PT Stop Time 0940    PT Time Calculation (min) 51 min             Past Medical History:  Diagnosis Date   Anxiety    Atypical chest pain    B12 deficiency    Cervical spondylosis without myelopathy    Diverticulosis of colon    Dysesthesia    GERD (gastroesophageal reflux disease)    Hypercholesteremia    Hypertension    IBS (irritable bowel syndrome)    Malignant melanoma (HCC)    Vitreous hemorrhage (HCC)    Past Surgical History:  Procedure Laterality Date   anterior cervical discectomy and fusion  1996   Dr. 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   Chronic cystitis 01/18/2022   Degeneration of lumbar intervertebral disc 01/18/2022   Lumbar spondylosis 01/18/2022   Lumbar radiculopathy 01/18/2022   Constipation 01/18/2022   Myalgia due to statin 01/18/2022   Aortic atherosclerosis (HCC) 01/18/2022   Hyponatremia 04/30/2021   Weakness 04/30/2021   COVID-19 virus infection 04/30/2021   Pain in wrist 01/16/2019   Neck pain 01/10/2019   Impacted cerumen of both ears 08/07/2018   Eustachian tube dysfunction, bilateral 08/07/2018   Mixed hyperlipidemia  03/30/2016   Vaginitis, atrophic 05/26/2015   Carotid artery disease (HCC) 02/15/2015   Voiding dysfunction 12/11/2014   Third degree hemorrhoids 10/13/2014   Procidentia of uterus 09/23/2014   Occlusion and stenosis of carotid artery without mention of cerebral infarction 09/02/2013   Bilateral dry eyes 07/14/2012   Actinic keratoses 05/25/2012   Nuclear cataract, nonsenile 12/25/2011   Other seborrheic keratosis 09/18/2011   History of malignant melanoma of skin 05/11/2011   OBSTRUCTIVE SLEEP APNEA 11/19/2009   SNORING 10/11/2009   Abnormal respiratory rate 10/11/2009   LACERATION OF FINGER 09/03/2009   DYSESTHESIA 05/23/2008   MALIGNANT MELANOMA 01/13/2008   ANXIETY 01/13/2008   VITREOUS HEMORRHAGE 01/13/2008   HTN (hypertension) 01/13/2008   DIVERTICULOSIS OF COLON 01/13/2008   IRRITABLE BOWEL SYNDROME 01/13/2008   CERVICAL SPONDYLOSIS WITHOUT MYELOPATHY 01/13/2008   CHEST PAIN, ATYPICAL 01/13/2008   HYPERCHOLESTEROLEMIA 01/10/2008   GERD 01/10/2008    PCP: Dr Adrian Prince  REFERRING PROVIDER: Dr Barnett Abu   REFERRING DIAG: Cervicalgia  THERAPY DIAG:  Cervicalgia  Other symptoms and signs involving the musculoskeletal system  Muscle weakness (generalized)  Abnormal posture  Rationale for Evaluation and Treatment: Rehabilitation  ONSET DATE: 04/13/22  SUBJECTIVE:  SUBJECTIVE STATEMENT: Edin reports that her neck is improving. She has some continued tightness and stiffness. She still has some days that are better than others. She is walking every day and has been working on her exercises at home more including with therabands.   EVAL: increase in neck pain following yard work ~ 3-4 months ago. She is better with Advil but it is worse in the evening. She has some  degenerative changes in the cervical spine.  Hand dominance: Right  PERTINENT HISTORY:  HTN; cervical sx with fusion 1996; arthritis; melanoma   PAIN:  Are you having pain? Yes: NPRS scale: 5/10(stiff and tight- not pain) Pain location: posterior cervical spine  Pain description: tightness; nagging; into head; stiffness  Aggravating factors: worse first thing in the morning and toward the end of the day; activities  Relieving factors: OTC antiinflammatory; moving   PRECAUTIONS: None  WEIGHT BEARING RESTRICTIONS: No  FALLS:  Has patient fallen in last 6 months? 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 Cervical spondylosis, slightly progressed at the C3-4 and C4-5 levels. 2. Mild-moderate canal stenosis at C4-5 with moderate right foraminal stenosis. 3. Mild-moderate right foraminal stenosis at C3-4. 4. Prior C5-C7 ACDF with solid arthrodesis.  PATIENT SURVEYS:  FOTO 44; goal 56 10/02/22: 53 11/29/22: 63  SENSATION: Intermittent tingling in Rt hand   POSTURE: Patient presents with head forward posture with increased thoracic kyphosis; shoulders rounded and elevated; scapulae abducted and rotated along the thoracic spine; head of the humerus anterior in orientation.   PALPATION: Muscular tightness in ant/lat/post cervical musculature; pecs; upper trap; leveator  10/02/22: persistent tightness deeper layers of cervical musculature into occipital area   CERVICAL ROM:   Active ROM A/PROM (deg) eval AROM 10/02/2022 AROM 10/23/22 AROM 11/09/22  Flexion 41 42 52 52  Extension 40 tight; pain 49 mild tightness 54 54  Right lateral flexion 25 tight  22 tight 23 tight 23  tight   Left lateral flexion 26 tight 26 tight 26 tight 26 tight   Right rotation 51 tight 56 tight  58 tight  57 tight   Left rotation 27 tight  48 tight  48 tight  48 tight    (Blank rows = not tested)  UPPER EXTREMITY STRENGTH: no pain with resistive testing   Active ROM Right eval 11/29/22 Left eval 11/29/22  Shoulder flexion 5  5   Shoulder extension 5  5   Shoulder abduction 5  5   Shoulder adduction      Shoulder extension      Shoulder internal rotation      Shoulder external rotation      Middle trap  4 5 4 5   Lower trap  4 5- 4 5-  Wrist flexion      Wrist extension      Wrist ulnar deviation      Wrist radial deviation      Wrist pronation      Wrist supination       (Blank rows = not tested)  UPPER EXTREMITY ROM: end range tightness as noted   MMT Right eval Left eval  Shoulder flexion Tight  Tight  Shoulder extension    Shoulder abduction Tight  Tight   Shoulder adduction    Shoulder extension    Shoulder internal rotation Tight  Tight   Shoulder external rotation    Middle trapezius    Lower trapezius    Elbow flexion    Elbow extension    Wrist flexion    Wrist extension    Wrist ulnar deviation    Wrist radial deviation    Wrist pronation    Wrist supination    Grip strength     (Blank rows = not tested)  CERVICAL SPECIAL TESTS:  Upper limb tension test (ULTT): Positive - tight R > L   [facility] Adult PT Treatment:           DATE: 11/29/22 Therapeutic Exercise: Supine  Chin tuck 10 sec x 10  Standing  UBE L4 x4 in alt fwd/back  Axial extension/chin tuck 5 sec x 10 Doorway stretch 3 positions 30 sec x 2  L's w/noodle along spine red TB 3 sec x 10  W's w/noodle along spine red TB 3 sec x 10  Row green TB 3 sec x 20 (HEP) Shoulder extension green TB 3 sec x 20 (HEP) Sitting  Chin tuck 5 sec x 5 Lateral cervical flexion 5 sec x 5 R/L Cervical rotation 3 sec x 5 R/L  Manual Therapy: STM through ant/lat/post cervical musculature pt supine   PROM/stretching cervical spine into cervical flexion and flexion with slight rotation  Neuromuscular re-ed: Working on posture and alignment/position of head and thoracic spine in sitting and standing Education re- importance of upright posture with improved strength in posterior shoulder girdle musculature and core strengthening  Modalities: Moist heat cervical spine, ice LB  Does not like TENS   [facility] Adult PT Treatment:           DATE: 11/09/22 Therapeutic Exercise: Supine  Chin tuck 10 sec x 10  Standing  UBE L4 x4 in alt fwd/back  Axial extension/chin tuck 5 sec x 10 Doorway stretch 3 positions 30 sec x 2  L's w/noodle along spine yellow TB 3 sec x 10 (HEP) W's w/noodle along spine yellow TB 3 sec x 10 (HEP) Row green TB 3 sec x 20 (HEP) Shoulder extension green TB 3 sec x 20 (HEP) Sitting  Chin tuck 5 sec x 5 Lateral cervical flexion 5 sec x 5 R/L Cervical rotation 3 sec x 5 R/L  Manual Therapy: STM through ant/lat/post cervical musculature pt supine  PROM/stretching cervical spine into cervical flexion and flexion with slight rotation  Neuromuscular re-ed: Working on posture and alignment/position of head and thoracic spine in sitting and standing Education re- importance of upright posture with improved strength in posterior shoulder girdle musculature  Modalities: Moist heat cervical spine, ice LB  Does not like TENS   [facility] Adult PT Treatment:    (dry needling)       DATE: 10/23/22 Therapeutic Exercise: Supine  Chin tuck 10 sec x 10  Standing  UBE L5 x4 in alt fwd/back  Axial extension/chin tuck 5 sec x 10 trial of ball and then pillow behind head for chin tuck  Doorway stretch 3 positions 30 sec x 2  L's w/noodle along spine yellow TB 3 sec x 10  W's w/noodle along spine yellow TB 3 sec x 10  Row green TB 3 sec x 20  Shoulder extension green TB 3 sec x 20  Sitting  Chin tuck 5 sec x 5 Lateral cervical flexion  5 sec x 5 R/L Cervical rotation 3 sec x 5 R/L   Manual Therapy: STM through ant/lat/post cervical musculature pt supine  PROM/stretching cervical spine into cervical flexion and flexion with slight rotation  Neuromuscular re-ed: Working on posture and alignment/position of head and thoracic spine in sitting and standing Education re- importance of upright posture with improved strength in posterior shoulder girdle musculature  Looked at musculature involved on line and explained the interaction of pecs and upper traps Modalities: Moist heat cervical spine, ice LB  Does not like TENS    PATIENT EDUCATION:  Education details: POC; HEP Person educated: Patient Education method: Programmer, multimedia, Demonstration, Actor cues, Verbal cues, and Handouts Education comprehension: verbalized understanding, returned demonstration, verbal cues required, tactile cues required, and needs further education  HOME EXERCISE PROGRAM: Access Code: [email or web address] URL: [email or web address] Date: 09/11/2022 Prepared by: Corlis Leak  Exercises - Supine Cervical Retraction with Towel  - 2 x daily - 7 x weekly - 1 sets - 5-10 reps - 10 sec  hold - Seated Cervical Retraction  - 2 x daily - 7 x weekly - 1-2 sets - 5-10 reps - 10 sec  hold - Seated Scapular Retraction  - 2 x daily - 7 x weekly - 1-2 sets - 10 reps - 10 sec  hold - Doorway Pec Stretch at 60 Degrees Abduction  - 3 x daily - 7 x weekly - 1 sets - 3 reps - Doorway Pec Stretch at 90 Degrees Abduction  - 3 x daily - 7 x weekly - 1 sets - 3 reps - 30 seconds  hold - Doorway Pec Stretch at 120 Degrees Abduction  - 3 x daily - 7 x weekly - 1 sets - 3 reps - 30 second hold  hold - Shoulder External Rotation and Scapular Retraction with Resistance  - 2 x daily - 7 x weekly - 1 sets - 10 reps - 3-5 sec  hold - Shoulder W - External Rotation with Resistance  - 2 x daily - 7 x weekly - 1-2 sets - 10 reps - 3 sec  hold - Standing Bilateral Low Shoulder Row with Anchored Resistance  - 2 x daily - 7 x  weekly - 1-3 sets - 10 reps - 2-3 sec  hold - Shoulder extension with resistance - Neutral  - 1 x daily - 7 x weekly - 1-2 sets - 10 reps - 3-5 sec  hold - Drawing Bow  - 1 x daily - 7 x weekly - 1 sets - 10 reps - 3 sec  hold Patient Education - Trigger Point Dry Needling  ASSESSMENT:  CLINICAL IMPRESSION: Note increase in strength in middle and lower traps with improving posture and alignment. Continued improvement in pain in the neck and shoulder. She has persistent tightness in the cervical spine.Jerelene has been more consistent with HEP. Improving symptoms with decreased tightness and discomfort. Patient demonstrates some increasing cervical ROM with the exception of lateral cervical flexion which remains the most limited cervical range and continues to feel tight. Focus on STM and myofacial release to address increased pain and tightness as well as exercises for cervical ROM and posterior shoulder girdle strengthening. Encouraged work on posture and alignment and exercises, keeping chin tucked to avoid extension at higher level cervical spine. Patient will continue to work onTB exercises consistently. Progressing well toward stated goals of therapy.   EVAL: Patient is a 77 y.o. female who was seen today for physical therapy evaluation and treatment for  cervicalgia. She has a history of ACDF C5/6 1996 and intermittent cervical pain and dysfunction since that time. Patient reports irritation of cervical symptoms with yard work ~ 4 months ago. She has muscular tightness and pain with movement. Symptoms are worse first thing in the morning and late day. She has some headaches related to tightness in the neck. Patient presents with poor posture and alignment; limited cervical and thoracic/shoulder ROM/mobility; muscular tightness to palpation; postural weakness.  OBJECTIVE IMPAIRMENTS:  ACDF C5/6 1996 and intermittent cervical pain and dysfunction since that time. Patient reports irritation of cervical  symptoms with yard work ~ 4 months ago. She has muscular tightness and pain with movement. Symptoms are worse first thing in the morning and late day. She has some headaches related to tightness in the neck. Patient presents with poor posture and alignment; limited cervical and thoracic/shoulder ROM/mobility; muscular tightness to palpation; postural weaknessdecreased activity tolerance, decreased mobility, decreased ROM, decreased strength, increased fascial restrictions, increased muscle spasms, impaired flexibility, improper body mechanics, postural dysfunction, and pain.    GOALS: Goals reviewed with patient? Yes  SHORT TERM GOALS: Target date: 09/21/2022  Independent in initial HEP  Baseline:  Goal status: met  2.  75% decrease in frequency and intensity of headaches  Baseline:  Goal status: met   LONG TERM GOALS: Target date: 12/28/2022   Decrease cervical pain by 75-100% allowing patient to return to all normal functional activities  Baseline:  Goal status: on going   2.  Increase cervical ROM to WFL's with no pain  Baseline:  Goal status: partially met   3.  Improve posture and alignment with patient to demonstrate improved upright posture with posterior shoulder girdle engaged  Baseline:  Goal status: partially met   4.  5/5 strength posterior shoulder girdle in middle and lower traps  Baseline:  Goal status: on going   5.  Independent in HEP  Baseline:  Goal status:on going   6.  Improve functional limitation score to 56 Baseline: 44 10/02/22: 53 Goal status:on going    PLAN:  PT FREQUENCY: 2x/week  PT DURATION: 12 weeks  PLANNED INTERVENTIONS: Therapeutic exercises, Therapeutic activity, Neuromuscular re-education, Balance training, Gait training, Patient/Family education, Self Care, Joint mobilization, Aquatic Therapy, Dry Needling, Electrical stimulation, Spinal mobilization, Cryotherapy, Moist heat, Taping, Ultrasound, Ionotophoresis 4mg /ml  Dexamethasone, Manual therapy, and Re-evaluation  PLAN FOR NEXT SESSION: review and progress exercises program; continue with spine care and body mechanics education; manual work, DN, modalities as indicated    [employer], PT 11/29/2022, 8:57 AM

## 2022-12-14 ENCOUNTER — Ambulatory Visit: Payer: Medicare Other | Attending: Neurological Surgery | Admitting: Rehabilitative and Restorative Service Providers"

## 2022-12-14 ENCOUNTER — Encounter: Payer: Self-pay | Admitting: Rehabilitative and Restorative Service Providers"

## 2022-12-14 DIAGNOSIS — R293 Abnormal posture: Secondary | ICD-10-CM | POA: Diagnosis not present

## 2022-12-14 DIAGNOSIS — R29898 Other symptoms and signs involving the musculoskeletal system: Secondary | ICD-10-CM | POA: Diagnosis not present

## 2022-12-14 DIAGNOSIS — M542 Cervicalgia: Secondary | ICD-10-CM | POA: Diagnosis not present

## 2022-12-14 DIAGNOSIS — M6281 Muscle weakness (generalized): Secondary | ICD-10-CM | POA: Insufficient documentation

## 2022-12-14 NOTE — Therapy (Signed)
OUTPATIENT PHYSICAL THERAPY CERVICAL TREATMENT       Patient Name: Wendy [name] MRN: [id number] DOB:1945-05-02, 77 y.o., female Today's Date: 12/14/2022  END OF SESSION:  PT End of Session - 12/14/22 1312     Visit Number 17    Number of Visits 30    Date for PT Re-Evaluation 12/28/22    Authorization Type medicare + supplemental    Progress Note Due on Visit 20    PT Start Time 1312    PT Stop Time 1400    PT Time Calculation (min) 48 min    Activity Tolerance Patient tolerated treatment well             Past Medical History:  Diagnosis Date   Anxiety    Atypical chest pain    B12 deficiency    Cervical spondylosis without myelopathy    Diverticulosis of colon    Dysesthesia    GERD (gastroesophageal reflux disease)    Hypercholesteremia    Hypertension    IBS (irritable bowel syndrome)    Malignant melanoma (HCC)    Vitreous hemorrhage (HCC)    Past Surgical History:  Procedure Laterality Date   anterior cervical discectomy and fusion  1996   Dr. 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   Chronic cystitis 01/18/2022   Degeneration of lumbar intervertebral disc 01/18/2022   Lumbar spondylosis 01/18/2022   Lumbar radiculopathy 01/18/2022   Constipation 01/18/2022   Myalgia due to statin 01/18/2022   Aortic atherosclerosis (HCC) 01/18/2022   Hyponatremia 04/30/2021   Weakness 04/30/2021   COVID-19 virus infection 04/30/2021   Pain in wrist 01/16/2019   Neck pain 01/10/2019   Impacted cerumen of both ears 08/07/2018   Eustachian tube  dysfunction, bilateral 08/07/2018   Mixed hyperlipidemia 03/30/2016   Vaginitis, atrophic 05/26/2015   Carotid artery disease (HCC) 02/15/2015   Voiding dysfunction 12/11/2014   Third degree hemorrhoids 10/13/2014   Procidentia of uterus 09/23/2014   Occlusion and stenosis of carotid artery without mention of cerebral infarction 09/02/2013   Bilateral dry eyes 07/14/2012   Actinic keratoses 05/25/2012   Nuclear cataract, nonsenile 12/25/2011   Other seborrheic keratosis 09/18/2011   History of malignant melanoma of skin 05/11/2011   OBSTRUCTIVE SLEEP APNEA 11/19/2009   SNORING 10/11/2009   Abnormal respiratory rate 10/11/2009   LACERATION OF FINGER 09/03/2009   DYSESTHESIA 05/23/2008   MALIGNANT MELANOMA 01/13/2008   ANXIETY 01/13/2008   VITREOUS HEMORRHAGE 01/13/2008   HTN (hypertension) 01/13/2008   DIVERTICULOSIS OF COLON 01/13/2008   IRRITABLE BOWEL SYNDROME 01/13/2008   CERVICAL SPONDYLOSIS WITHOUT MYELOPATHY 01/13/2008   CHEST PAIN, ATYPICAL 01/13/2008   HYPERCHOLESTEROLEMIA 01/10/2008   GERD 01/10/2008    PCP: Dr Adrian Prince  REFERRING PROVIDER: Dr Barnett Abu   REFERRING DIAG: Cervicalgia  THERAPY DIAG:  Cervicalgia  Other symptoms and signs involving the musculoskeletal system  Muscle weakness (generalized)  Abnormal posture  Rationale for Evaluation and Treatment: Rehabilitation  ONSET DATE: 04/13/22  SUBJECTIVE:  SUBJECTIVE STATEMENT: Meshayla reports that her neck is tight and she has had aching "all over" which she says may be related to the cold weather. She has not been doing exercises as consistently as she was. She still has some days that are better than others. She is walking every day and has been working on her exercises at home more including with  therabands.   EVAL: increase in neck pain following yard work ~ 3-4 months ago. She is better with Advil but it is worse in the evening. She has some degenerative changes in the cervical spine.  Hand dominance: Right  PERTINENT HISTORY:  HTN; cervical sx with fusion 1996; arthritis; melanoma   PAIN:  Are you having pain? Yes: NPRS scale: 7/10(stiff and tight- some pain) Pain location: posterior cervical spine  Pain description: tightness; nagging; into head; stiffness  Aggravating factors: worse first thing in the morning and toward the end of the day; activities  Relieving factors: OTC antiinflammatory; moving   PRECAUTIONS: None  WEIGHT BEARING RESTRICTIONS: No  FALLS:  Has patient fallen in last 6 months? 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 Cervical spondylosis, slightly progressed at the C3-4 and C4-5 levels. 2. Mild-moderate canal stenosis at C4-5 with moderate right foraminal stenosis. 3. Mild-moderate right foraminal stenosis at C3-4. 4. Prior C5-C7 ACDF with solid arthrodesis.  PATIENT SURVEYS:  FOTO 44; goal 56 10/02/22: 53 11/29/22: 63  SENSATION: Intermittent tingling in Rt hand   POSTURE: Patient presents with head forward posture with increased thoracic kyphosis; shoulders rounded and elevated; scapulae abducted and rotated along the thoracic spine; head of the humerus anterior in orientation.   PALPATION: Muscular tightness in ant/lat/post cervical musculature; pecs; upper trap; leveator  10/02/22: persistent tightness deeper layers of cervical musculature into occipital area   CERVICAL ROM:   Active ROM A/PROM (deg) eval AROM 10/02/2022  AROM 10/23/22 AROM 11/09/22  Flexion 41 42 52 52  Extension 40 tight; pain 49 mild tightness 54 54  Right lateral flexion 25 tight  22 tight 23 tight 23 tight   Left lateral flexion 26 tight 26 tight 26 tight 26 tight   Right rotation 51 tight 56 tight  58 tight  57 tight   Left rotation 27 tight  48 tight  48 tight  48 tight    (Blank rows = not tested)  UPPER EXTREMITY STRENGTH: no pain with resistive testing   Active ROM Right eval 11/29/22 Left eval 11/29/22  Shoulder flexion 5  5   Shoulder extension 5  5   Shoulder abduction 5  5   Shoulder adduction      Shoulder extension      Shoulder internal rotation      Shoulder external rotation      Middle trap  4 5 4 5   Lower trap  4 5- 4 5-  Wrist flexion      Wrist extension      Wrist ulnar deviation      Wrist radial deviation      Wrist pronation      Wrist supination       (Blank rows = not tested)  UPPER EXTREMITY  ROM: end range tightness as noted   MMT Right eval Left eval  Shoulder flexion Tight  Tight   Shoulder extension    Shoulder abduction Tight  Tight   Shoulder adduction    Shoulder extension    Shoulder internal rotation Tight  Tight   Shoulder external rotation    Middle trapezius    Lower trapezius    Elbow flexion    Elbow extension    Wrist flexion    Wrist extension    Wrist ulnar deviation    Wrist radial deviation    Wrist pronation    Wrist supination    Grip strength     (Blank rows = not tested)  CERVICAL SPECIAL TESTS:  Upper limb tension test (ULTT): Positive - tight R > L   [facility] Adult PT Treatment:           DATE: 12/14/22 Therapeutic Exercise: Supine  Chin tuck 10 sec x 10  Standing  UBE L4 x4 in alt fwd/back  Axial extension/chin tuck 5 sec x 10 Doorway stretch 3 positions 30 sec x 2  Review for HEP  L's w/noodle along spine red TB 3 sec x 10  W's w/noodle along spine red TB 3 sec x 10  Row green TB 3 sec x 20 (HEP) Shoulder extension green TB 3 sec x 20  (HEP) Sitting  Chin tuck 5 sec x 5 Lateral cervical flexion 5 sec x 5 R/L Cervical rotation 3 sec x 5 R/L  Manual Therapy: STM through ant/lat/post cervical musculature pt supine  PROM/stretching cervical spine into cervical flexion and flexion with slight rotation  Neuromuscular re-ed: Working on posture and alignment/position of head and thoracic spine in sitting and standing Education re- importance of upright posture with improved strength in posterior shoulder girdle musculature and core strengthening  Modalities: Moist heat cervical spine, ice LB  Does not like TENS   [facility] Adult PT Treatment:           DATE: 11/29/22 Therapeutic Exercise: Supine  Chin tuck 10 sec x 10  Standing  UBE L4 x4 in alt fwd/back  Axial extension/chin tuck 5 sec x 10 Doorway stretch 3 positions 30 sec x 2  L's w/noodle along spine red TB 3 sec x 10  W's w/noodle along spine red TB 3 sec x 10  Row green TB 3 sec x 20 (HEP) Shoulder extension green TB 3 sec x 20 (HEP) Sitting  Chin tuck 5 sec x 5 Lateral cervical flexion 5 sec x 5 R/L Cervical rotation 3 sec x 5 R/L  Manual Therapy: STM through ant/lat/post cervical musculature pt supine  PROM/stretching cervical spine into cervical flexion and flexion with slight rotation  Neuromuscular re-ed: Working on posture and alignment/position of head and thoracic spine in sitting and standing Education re- importance of upright posture with improved strength in posterior shoulder girdle musculature and core strengthening  Modalities: Moist heat cervical spine, ice LB  Does not like TENS   [facility] Adult PT Treatment:           DATE: 11/09/22 Therapeutic Exercise: Supine  Chin tuck 10 sec x 10  Standing  UBE L4 x4 in alt fwd/back  Axial extension/chin tuck 5 sec x 10 Doorway stretch 3 positions 30 sec x 2  L's w/noodle along spine yellow TB 3 sec x 10 (HEP) W's w/noodle along spine yellow TB 3 sec x 10 (HEP) Row green TB 3 sec x 20 (HEP) Shoulder  extension green TB  3 sec x 20 (HEP) Sitting  Chin tuck 5 sec x 5 Lateral cervical flexion 5 sec x 5 R/L Cervical rotation 3 sec x 5 R/L  Manual Therapy: STM through ant/lat/post cervical musculature pt supine  PROM/stretching cervical spine into cervical flexion and flexion with slight rotation  Neuromuscular re-ed: Working on posture and alignment/position of head and thoracic spine in sitting and standing Education re- importance of upright posture with improved strength in posterior shoulder girdle musculature  Modalities: Moist heat cervical spine, ice LB  Does not like TENS   [facility] Adult PT Treatment:    (dry needling)       DATE: 10/23/22 Therapeutic Exercise: Supine  Chin tuck 10 sec x 10  Standing  UBE L5 x4 in alt fwd/back  Axial extension/chin tuck 5 sec x 10 trial of ball and then pillow behind head for chin tuck  Doorway stretch 3 positions 30 sec x 2  L's w/noodle along spine yellow TB 3 sec x 10  W's w/noodle along spine yellow TB 3 sec x 10  Row green TB 3 sec x 20  Shoulder extension green TB 3 sec x 20  Sitting  Chin tuck 5 sec x 5 Lateral cervical flexion 5 sec x 5 R/L Cervical rotation 3 sec x 5 R/L  Manual Therapy: STM through ant/lat/post cervical musculature pt supine  PROM/stretching cervical spine into cervical flexion and flexion with slight rotation  Neuromuscular re-ed: Working on posture and alignment/position of head and thoracic spine in sitting and standing Education re- importance of upright posture with improved strength in posterior shoulder girdle musculature  Looked at musculature involved on line and explained the interaction of pecs and upper traps Modalities: Moist heat cervical spine, ice LB  Does not like TENS    PATIENT EDUCATION:  Education details: POC; HEP Person educated: Patient Education method: Programmer, multimedia, Demonstration, Actor cues, Verbal cues, and Handouts Education comprehension: verbalized understanding, returned  demonstration, verbal cues required, tactile cues required, and needs further education  HOME EXERCISE PROGRAM: Access Code: [email or web address] URL: [email or web address] Date: 09/11/2022 Prepared by: Corlis Leak  Exercises - Supine Cervical Retraction with Towel  - 2 x daily - 7 x weekly - 1 sets - 5-10 reps - 10 sec  hold - Seated Cervical Retraction  - 2 x daily - 7 x weekly - 1-2 sets - 5-10 reps - 10 sec  hold - Seated Scapular Retraction  - 2 x daily - 7 x weekly - 1-2 sets - 10 reps - 10 sec  hold - Doorway Pec Stretch at 60 Degrees Abduction  - 3 x daily - 7 x weekly - 1 sets - 3 reps - Doorway Pec Stretch at 90 Degrees Abduction  - 3 x daily - 7 x weekly - 1 sets - 3 reps - 30 seconds  hold - Doorway Pec Stretch at 120 Degrees Abduction  - 3 x daily - 7 x weekly - 1 sets - 3 reps - 30 second hold  hold - Shoulder External Rotation and Scapular Retraction with Resistance  - 2 x daily - 7 x weekly - 1 sets - 10 reps - 3-5 sec  hold - Shoulder W - External Rotation with Resistance  - 2 x daily - 7 x weekly - 1-2 sets - 10 reps - 3 sec  hold - Standing Bilateral Low Shoulder Row with Anchored Resistance  - 2 x daily - 7 x weekly - 1-3 sets - 10 reps - 2-3 sec  hold - Shoulder extension with resistance - Neutral  - 1 x daily - 7 x weekly - 1-2 sets - 10 reps - 3-5 sec  hold - Drawing Bow  - 1 x daily - 7 x weekly - 1 sets - 10 reps - 3 sec  hold Patient Education - Trigger Point Dry Needling  ASSESSMENT:  CLINICAL IMPRESSION: Patient has increased pain and tightness in the cervical area in the past few days which may be related to colder weather. Patient has also not been as consistent with her strengthening exercises. She has continued muscular tightness to palpation. Will benefit from continued treatment to reinforce HEP. Anticipate d/c at next visit.   Note increase in strength in middle and lower traps with improving posture and alignment. Continued improvement in pain in the  neck and shoulder. She has persistent tightness in the cervical spine.[place] has been more consistent with HEP. Improving symptoms with decreased tightness and discomfort. Patient demonstrates some increasing cervical ROM with the exception of lateral cervical flexion which remains the most limited cervical range and continues to feel tight. Focus on STM and myofacial release to address increased pain and tightness as well as exercises for cervical ROM and posterior shoulder girdle strengthening. Encouraged work on posture and alignment and exercises, keeping chin tucked to avoid extension at higher level cervical spine. Patient will continue to work onTB exercises consistently. Progressing well toward stated goals of therapy.   EVAL: Patient is a 77 y.o. female who was seen today for physical therapy evaluation and treatment for cervicalgia. She has a history of ACDF C5/6 1996 and intermittent cervical pain and dysfunction since that time. Patient reports irritation of cervical symptoms with yard work ~ 4 months ago. She has muscular tightness and pain with movement. Symptoms are worse first thing in the morning and late day. She has some headaches related to tightness in the neck. Patient presents with poor posture and alignment; limited cervical and thoracic/shoulder ROM/mobility; muscular tightness to palpation; postural weakness.  OBJECTIVE IMPAIRMENTS:  ACDF C5/6 1996 and intermittent cervical pain and dysfunction since that time. Patient reports irritation of cervical symptoms with yard work ~ 4 months ago. She has muscular tightness and pain with movement. Symptoms are worse first thing in the morning and late day. She has some headaches related to tightness in the neck. Patient presents with poor posture and alignment; limited cervical and thoracic/shoulder ROM/mobility; muscular tightness to palpation; postural weaknessdecreased activity tolerance, decreased mobility, decreased ROM, decreased strength,  increased fascial restrictions, increased muscle spasms, impaired flexibility, improper body mechanics, postural dysfunction, and pain.    GOALS: Goals reviewed with patient? Yes  SHORT TERM GOALS: Target date: 09/21/2022  Independent in initial HEP  Baseline:  Goal status: met  2.  75% decrease in frequency and intensity of headaches  Baseline:  Goal status: met   LONG TERM GOALS: Target date: 12/28/2022   Decrease cervical pain by 75-100% allowing patient to return to all normal functional activities  Baseline:  Goal status: on going   2.  Increase cervical ROM to WFL's with no pain  Baseline:  Goal status: partially met   3.  Improve posture and alignment with patient to demonstrate improved upright posture with posterior shoulder girdle engaged  Baseline:  Goal status: partially met   4.  5/5 strength posterior shoulder girdle in middle and lower traps  Baseline:  Goal status: on going   5.  Independent in HEP  Baseline:  Goal status:on going  6.  Improve functional limitation score to 56 Baseline: 44 10/02/22: 53 Goal status:on going    PLAN:  PT FREQUENCY: 2x/week  PT DURATION: 12 weeks  PLANNED INTERVENTIONS: Therapeutic exercises, Therapeutic activity, Neuromuscular re-education, Balance training, Gait training, Patient/Family education, Self Care, Joint mobilization, Aquatic Therapy, Dry Needling, Electrical stimulation, Spinal mobilization, Cryotherapy, Moist heat, Taping, Ultrasound, Ionotophoresis 4mg /ml Dexamethasone, Manual therapy, and Re-evaluation  PLAN FOR NEXT SESSION: review and progress exercises program; continue with spine care and body mechanics education; manual work, DN, modalities as indicated    [employer], PT 12/14/2022, 1:12 PM

## 2022-12-19 ENCOUNTER — Ambulatory Visit: Payer: Medicare Other | Admitting: Rehabilitative and Restorative Service Providers"

## 2022-12-21 DIAGNOSIS — Z23 Encounter for immunization: Secondary | ICD-10-CM | POA: Diagnosis not present

## 2022-12-25 DIAGNOSIS — Z85820 Personal history of malignant melanoma of skin: Secondary | ICD-10-CM | POA: Diagnosis not present

## 2022-12-25 DIAGNOSIS — Z85828 Personal history of other malignant neoplasm of skin: Secondary | ICD-10-CM | POA: Diagnosis not present

## 2022-12-25 DIAGNOSIS — L821 Other seborrheic keratosis: Secondary | ICD-10-CM | POA: Diagnosis not present

## 2022-12-25 DIAGNOSIS — D692 Other nonthrombocytopenic purpura: Secondary | ICD-10-CM | POA: Diagnosis not present

## 2022-12-25 DIAGNOSIS — D485 Neoplasm of uncertain behavior of skin: Secondary | ICD-10-CM | POA: Diagnosis not present

## 2022-12-25 DIAGNOSIS — L570 Actinic keratosis: Secondary | ICD-10-CM | POA: Diagnosis not present

## 2022-12-25 DIAGNOSIS — L814 Other melanin hyperpigmentation: Secondary | ICD-10-CM | POA: Diagnosis not present

## 2022-12-26 ENCOUNTER — Ambulatory Visit: Payer: Medicare Other | Admitting: Rehabilitative and Restorative Service Providers"

## 2022-12-26 ENCOUNTER — Encounter: Payer: Self-pay | Admitting: Rehabilitative and Restorative Service Providers"

## 2022-12-26 DIAGNOSIS — M6281 Muscle weakness (generalized): Secondary | ICD-10-CM | POA: Diagnosis not present

## 2022-12-26 DIAGNOSIS — M542 Cervicalgia: Secondary | ICD-10-CM

## 2022-12-26 DIAGNOSIS — R293 Abnormal posture: Secondary | ICD-10-CM

## 2022-12-26 DIAGNOSIS — M4802 Spinal stenosis, cervical region: Secondary | ICD-10-CM | POA: Diagnosis not present

## 2022-12-26 DIAGNOSIS — M47892 Other spondylosis, cervical region: Secondary | ICD-10-CM | POA: Diagnosis not present

## 2022-12-26 DIAGNOSIS — R29898 Other symptoms and signs involving the musculoskeletal system: Secondary | ICD-10-CM

## 2022-12-26 NOTE — Therapy (Signed)
OUTPATIENT PHYSICAL THERAPY CERVICAL TREATMENT AND DISCHARGE SUMMARY    PHYSICAL THERAPY DISCHARGE SUMMARY  Visits from Start of Care: 18  Current functional level related to goals / functional outcomes: See progress note for discharge status    Remaining deficits: Continued intermittent tightness and discomfort through the cervical spine area    Education / Equipment: HEP   Patient agrees to discharge. Patient goals were partially met. Patient is being discharged due to being pleased with the current functional level.  [name] [name] PT, MPH 12/26/22 12:41 PM    Patient Name: Wendy [name] MRN: [id number] DOB:[birth year], 77 y.o., female Today's Date: 12/26/2022  END OF SESSION:  PT End of Session - 12/26/22 1022     Visit Number 18    Number of Visits 30    Date for PT Re-Evaluation 12/28/22    Authorization Type medicare + supplemental    Progress Note Due on Visit 20    PT Start Time 1018    PT Stop Time 1106    PT Time Calculation (min) 48 min    Activity Tolerance Patient tolerated treatment well             Past Medical History:  Diagnosis Date   Anxiety    Atypical chest pain    B12 deficiency    Cervical spondylosis without myelopathy    Diverticulosis of colon    Dysesthesia    GERD (gastroesophageal reflux disease)    Hypercholesteremia    Hypertension    IBS (irritable bowel syndrome)    Malignant melanoma (HCC)    Vitreous hemorrhage (HCC)    Past Surgical History:  Procedure Laterality Date   anterior cervical discectomy and fusion  1996   Dr. 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   Chronic cystitis 01/18/2022   Degeneration of lumbar intervertebral disc 01/18/2022   Lumbar spondylosis 01/18/2022   Lumbar radiculopathy 01/18/2022   Constipation 01/18/2022   Myalgia due to statin 01/18/2022   Aortic atherosclerosis (HCC) 01/18/2022   Hyponatremia 04/30/2021   Weakness 04/30/2021   COVID-19 virus infection 04/30/2021   Pain in wrist 01/16/2019   Neck pain 01/10/2019   Impacted cerumen of both ears 08/07/2018   Eustachian tube dysfunction, bilateral 08/07/2018   Mixed hyperlipidemia 03/30/2016   Vaginitis, atrophic 05/26/2015   Carotid artery disease (HCC) 02/15/2015   Voiding dysfunction 12/11/2014   Third degree hemorrhoids 10/13/2014   Procidentia of uterus 09/23/2014   Occlusion and stenosis of carotid artery without mention of cerebral infarction 09/02/2013   Bilateral dry eyes 07/14/2012   Actinic keratoses 05/25/2012   Nuclear cataract, nonsenile 12/25/2011   Other seborrheic keratosis 09/18/2011   History of malignant melanoma of skin 05/11/2011   OBSTRUCTIVE SLEEP APNEA 11/19/2009   SNORING 10/11/2009   Abnormal respiratory rate 10/11/2009   LACERATION OF FINGER 09/03/2009   DYSESTHESIA 05/23/2008   MALIGNANT MELANOMA 01/13/2008   ANXIETY 01/13/2008   VITREOUS HEMORRHAGE 01/13/2008   HTN (hypertension) 01/13/2008   DIVERTICULOSIS OF COLON 01/13/2008  IRRITABLE BOWEL SYNDROME 01/13/2008   CERVICAL SPONDYLOSIS WITHOUT MYELOPATHY 01/13/2008   CHEST PAIN, ATYPICAL 01/13/2008   HYPERCHOLESTEROLEMIA 01/10/2008   GERD 01/10/2008    PCP: Dr Adrian Prince  REFERRING PROVIDER: Dr Barnett Abu   REFERRING DIAG: Cervicalgia  THERAPY DIAG:  Cervicalgia  Other symptoms and signs involving the musculoskeletal system  Muscle weakness (generalized)  Abnormal posture  Rationale for Evaluation and Treatment: Rehabilitation  ONSET DATE: 04/13/22  SUBJECTIVE:                                                                                                                                                                                                          SUBJECTIVE STATEMENT: Daleena reports that her neck is doing better overall. She has some persistent tightness and some aching. She is working on stretching and some strengthening at home. She has not been doing exercises as consistently as she was. She still has some days that are better than others. She is walking every day and has been working on her exercises at home more including with therabands.   EVAL: increase in neck pain following yard work ~ 3-4 months ago. She is better with Advil but it is worse in the evening. She has some degenerative changes in the cervical spine.  Hand dominance: Right  PERTINENT HISTORY:  HTN; cervical sx with fusion 1996; arthritis; melanoma   PAIN:  Are you having pain? Yes: NPRS scale: 5/10(stiff and tight- some pain) Pain location: posterior cervical spine  Pain description: tightness; nagging; into head; stiffness  Aggravating factors: worse first thing in the morning and toward the end of the day; activities  Relieving factors: OTC antiinflammatory; moving   PRECAUTIONS: None  WEIGHT BEARING RESTRICTIONS: No  FALLS:  Has patient fallen in last 6 months? 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 Cervical spondylosis, slightly progressed at the C3-4 and C4-5 levels. 2. Mild-moderate canal stenosis at C4-5 with moderate right foraminal stenosis. 3. Mild-moderate right foraminal stenosis at C3-4. 4. Prior C5-C7 ACDF with solid arthrodesis.  PATIENT SURVEYS:   FOTO 44; goal 56 10/02/22: 53 11/29/22: 63  SENSATION: Intermittent tingling in Rt hand   POSTURE: Patient presents with head forward posture with increased thoracic kyphosis;  shoulders rounded and elevated; scapulae abducted and rotated along the thoracic spine; head of the humerus anterior in orientation.   PALPATION: Muscular tightness in ant/lat/post cervical musculature; pecs; upper trap; leveator  10/02/22: persistent tightness deeper layers of cervical musculature into occipital area   CERVICAL ROM:   Active ROM A/PROM (deg) eval AROM 10/02/2022 AROM 10/23/22 AROM 11/09/22  Flexion 41 42 52 52  Extension 40 tight; pain 49 mild tightness 54 54  Right lateral flexion 25 tight  22 tight 23 tight 23 tight   Left lateral flexion 26 tight 26 tight 26 tight 26 tight   Right rotation 51 tight 56 tight  58 tight  57 tight   Left rotation 27 tight  48 tight  48 tight  48 tight    (Blank rows = not tested)  UPPER EXTREMITY STRENGTH: no pain with resistive testing   Active ROM Right eval 11/29/22 Left eval 11/29/22  Shoulder flexion 5  5   Shoulder extension 5  5   Shoulder abduction 5  5   Shoulder adduction      Shoulder extension      Shoulder internal rotation      Shoulder external rotation      Middle trap  4 5 4 5   Lower trap  4 5- 4 5-  Wrist flexion      Wrist extension      Wrist ulnar deviation      Wrist radial deviation      Wrist pronation      Wrist supination       (Blank rows = not tested)  UPPER EXTREMITY ROM: end range tightness as noted   MMT Right eval Left eval  Shoulder flexion Tight  Tight   Shoulder extension    Shoulder abduction Tight  Tight   Shoulder adduction    Shoulder extension    Shoulder internal rotation Tight  Tight   Shoulder external rotation    Middle trapezius    Lower trapezius    Elbow flexion    Elbow extension    Wrist flexion    Wrist extension    Wrist ulnar deviation    Wrist radial deviation    Wrist pronation     Wrist supination    Grip strength     (Blank rows = not tested)  CERVICAL SPECIAL TESTS:  Upper limb tension test (ULTT): Positive - tight R > L   [facility] Adult PT Treatment:           DATE: 12/25/22 Therapeutic Exercise: Supine  Chin tuck 10 sec x 10  Standing  UBE L4 x4 in alt fwd/back  Axial extension/chin tuck 5 sec x 10 Doorway stretch 3 positions 30 sec x 2  Review for HEP  L's  red TB 3 sec x 10  W's red TB 3 sec x 10  Row blue TB 3 sec x 10  Shoulder extension blue TB 3 sec x 10  Bow and arrow blue TB 3 sec x 10 R/L Sitting  Chin tuck 5 sec x 5 Lateral cervical flexion 5 sec x 5 R/L Cervical rotation 3 sec x 5 R/L  Manual Therapy: STM through ant/lat/post cervical musculature pt supine  PROM/stretching cervical spine into cervical flexion and flexion with slight rotation  Neuromuscular re-ed: Working on posture and alignment/position of head and thoracic spine in sitting and standing Education re- importance of upright posture with improved strength in posterior shoulder girdle musculature and core strengthening  Modalities:  Moist heat cervical spine, ice LB  Does not like TENS  [facility] Adult PT Treatment:    (dry needling)       DATE: 10/23/22 Therapeutic Exercise: Supine  Chin tuck 10 sec x 10  Standing  UBE L5 x4 in alt fwd/back  Axial extension/chin tuck 5 sec x 10 trial of ball and then pillow behind head for chin tuck  Doorway stretch 3 positions 30 sec x 2  L's w/noodle along spine yellow TB 3 sec x 10  W's w/noodle along spine yellow TB 3 sec x 10  Row green TB 3 sec x 20  Shoulder extension green TB 3 sec x 20  Sitting  Chin tuck 5 sec x 5 Lateral cervical flexion 5 sec x 5 R/L Cervical rotation 3 sec x 5 R/L  Manual Therapy: STM through ant/lat/post cervical musculature pt supine  PROM/stretching cervical spine into cervical flexion and flexion with slight rotation  Neuromuscular re-ed: Working on posture and alignment/position of head and thoracic  spine in sitting and standing Education re- importance of upright posture with improved strength in posterior shoulder girdle musculature  Looked at musculature involved on line and explained the interaction of pecs and upper traps Modalities: Moist heat cervical spine, ice LB  Does not like TENS    PATIENT EDUCATION:  Education details: POC; HEP Person educated: Patient Education method: Programmer, multimedia, Demonstration, Actor cues, Verbal cues, and Handouts Education comprehension: verbalized understanding, returned demonstration, verbal cues required, tactile cues required, and needs further education  HOME EXERCISE PROGRAM: Access Code: [email or web address] URL: [email or web address] Date: 09/11/2022 Prepared by: Corlis Leak  Exercises - Supine Cervical Retraction with Towel  - 2 x daily - 7 x weekly - 1 sets - 5-10 reps - 10 sec  hold - Seated Cervical Retraction  - 2 x daily - 7 x weekly - 1-2 sets - 5-10 reps - 10 sec  hold - Seated Scapular Retraction  - 2 x daily - 7 x weekly - 1-2 sets - 10 reps - 10 sec  hold - Doorway Pec Stretch at 60 Degrees Abduction  - 3 x daily - 7 x weekly - 1 sets - 3 reps - Doorway Pec Stretch at 90 Degrees Abduction  - 3 x daily - 7 x weekly - 1 sets - 3 reps - 30 seconds  hold - Doorway Pec Stretch at 120 Degrees Abduction  - 3 x daily - 7 x weekly - 1 sets - 3 reps - 30 second hold  hold - Shoulder External Rotation and Scapular Retraction with Resistance  - 2 x daily - 7 x weekly - 1 sets - 10 reps - 3-5 sec  hold - Shoulder W - External Rotation with Resistance  - 2 x daily - 7 x weekly - 1-2 sets - 10 reps - 3 sec  hold - Standing Bilateral Low Shoulder Row with Anchored Resistance  - 2 x daily - 7 x weekly - 1-3 sets - 10 reps - 2-3 sec  hold - Shoulder extension with resistance - Neutral  - 1 x daily - 7 x weekly - 1-2 sets - 10 reps - 3-5 sec  hold - Drawing Bow  - 1 x daily - 7 x weekly - 1 sets - 10 reps - 3 sec  hold Patient Education -  Trigger Point Dry Needling  ASSESSMENT:  CLINICAL IMPRESSION: Patient has persistent intermittent pain and tightness in the cervical area overall since beginning therapy. She demonstrates increased cervical ROM/mobility  with decreased palpable tightness goals of therapy have been partially accomplished. Encouraged Anieya to work consistently with exercises including resistive strengthening program. Patient will continue with independent HEP and call with questions or problems.     Note increase in strength in middle and lower traps with improving posture and alignment. Continued improvement in pain in the neck and shoulder. She has persistent tightness in the cervical spine.Chloris has been more consistent with HEP. Improving symptoms with decreased tightness and discomfort. Patient demonstrates some increasing cervical ROM with the exception of lateral cervical flexion which remains the most limited cervical range and continues to feel tight. Focus on STM and myofacial release to address increased pain and tightness as well as exercises for cervical ROM and posterior shoulder girdle strengthening. Encouraged work on posture and alignment and exercises, keeping chin tucked to avoid extension at higher level cervical spine. Patient will continue to work onTB exercises consistently. Progressing well toward stated goals of therapy.   EVAL: Patient is a 77 y.o. female who was seen today for physical therapy evaluation and treatment for cervicalgia. She has a history of ACDF C5/6 1996 and intermittent cervical pain and dysfunction since that time. Patient reports irritation of cervical symptoms with yard work ~ 4 months ago. She has muscular tightness and pain with movement. Symptoms are worse first thing in the morning and late day. She has some headaches related to tightness in the neck. Patient presents with poor posture and alignment; limited cervical and thoracic/shoulder ROM/mobility; muscular tightness to  palpation; postural weakness.  OBJECTIVE IMPAIRMENTS:  ACDF C5/6 1996 and intermittent cervical pain and dysfunction since that time. Patient reports irritation of cervical symptoms with yard work ~ 4 months ago. She has muscular tightness and pain with movement. Symptoms are worse first thing in the morning and late day. She has some headaches related to tightness in the neck. Patient presents with poor posture and alignment; limited cervical and thoracic/shoulder ROM/mobility; muscular tightness to palpation; postural weaknessdecreased activity tolerance, decreased mobility, decreased ROM, decreased strength, increased fascial restrictions, increased muscle spasms, impaired flexibility, improper body mechanics, postural dysfunction, and pain.    GOALS: Goals reviewed with patient? Yes  SHORT TERM GOALS: Target date: 09/21/2022  Independent in initial HEP  Baseline:  Goal status: met  2.  75% decrease in frequency and intensity of headaches  Baseline:  Goal status: met   LONG TERM GOALS: Target date: 12/28/2022   Decrease cervical pain by 75-100% allowing patient to return to all normal functional activities  Baseline:  Goal status: on going   2.  Increase cervical ROM to WFL's with no pain  Baseline:  Goal status: partially met   3.  Improve posture and alignment with patient to demonstrate improved upright posture with posterior shoulder girdle engaged  Baseline:  Goal status: partially met   4.  5/5 strength posterior shoulder girdle in middle and lower traps  Baseline:  Goal status: on going   5.  Independent in HEP  Baseline:  Goal status:on going   6.  Improve functional limitation score to 56 Baseline: 44 10/02/22: 53 Goal status:on going    PLAN:  PT FREQUENCY: 2x/week  PT DURATION: 12 weeks  PLANNED INTERVENTIONS: Therapeutic exercises, Therapeutic activity, Neuromuscular re-education, Balance training, Gait training, Patient/Family education, Self Care,  Joint mobilization, Aquatic Therapy, Dry Needling, Electrical stimulation, Spinal mobilization, Cryotherapy, Moist heat, Taping, Ultrasound, Ionotophoresis 4mg /ml Dexamethasone, Manual therapy, and Re-evaluation  PLAN FOR NEXT SESSION: review and progress exercises program; continue with  spine care and body mechanics education; manual work, DN, modalities as indicated    [employer], PT 12/26/2022, 10:23 AM

## 2023-01-02 DIAGNOSIS — Z1389 Encounter for screening for other disorder: Secondary | ICD-10-CM | POA: Diagnosis not present

## 2023-01-02 DIAGNOSIS — I10 Essential (primary) hypertension: Secondary | ICD-10-CM | POA: Diagnosis not present

## 2023-01-02 DIAGNOSIS — H35363 Drusen (degenerative) of macula, bilateral: Secondary | ICD-10-CM | POA: Diagnosis not present

## 2023-01-02 DIAGNOSIS — E785 Hyperlipidemia, unspecified: Secondary | ICD-10-CM | POA: Diagnosis not present

## 2023-01-02 DIAGNOSIS — E559 Vitamin D deficiency, unspecified: Secondary | ICD-10-CM | POA: Diagnosis not present

## 2023-01-19 DIAGNOSIS — I6523 Occlusion and stenosis of bilateral carotid arteries: Secondary | ICD-10-CM | POA: Diagnosis not present

## 2023-01-19 DIAGNOSIS — E785 Hyperlipidemia, unspecified: Secondary | ICD-10-CM | POA: Diagnosis not present

## 2023-01-19 DIAGNOSIS — I10 Essential (primary) hypertension: Secondary | ICD-10-CM | POA: Diagnosis not present

## 2023-01-19 DIAGNOSIS — M5090 Cervical disc disorder, unspecified, unspecified cervical region: Secondary | ICD-10-CM | POA: Diagnosis not present

## 2023-01-19 DIAGNOSIS — E871 Hypo-osmolality and hyponatremia: Secondary | ICD-10-CM | POA: Diagnosis not present

## 2023-01-19 DIAGNOSIS — M609 Myositis, unspecified: Secondary | ICD-10-CM | POA: Diagnosis not present

## 2023-01-19 DIAGNOSIS — E559 Vitamin D deficiency, unspecified: Secondary | ICD-10-CM | POA: Diagnosis not present

## 2023-01-19 DIAGNOSIS — Z0000 Encounter for general adult medical examination without abnormal findings: Secondary | ICD-10-CM | POA: Diagnosis not present

## 2023-01-19 DIAGNOSIS — E222 Syndrome of inappropriate secretion of antidiuretic hormone: Secondary | ICD-10-CM | POA: Diagnosis not present

## 2023-01-19 DIAGNOSIS — I2584 Coronary atherosclerosis due to calcified coronary lesion: Secondary | ICD-10-CM | POA: Diagnosis not present

## 2023-01-19 DIAGNOSIS — R82998 Other abnormal findings in urine: Secondary | ICD-10-CM | POA: Diagnosis not present

## 2023-01-19 DIAGNOSIS — I700 Atherosclerosis of aorta: Secondary | ICD-10-CM | POA: Diagnosis not present

## 2023-01-19 DIAGNOSIS — C4330 Malignant melanoma of unspecified part of face: Secondary | ICD-10-CM | POA: Diagnosis not present

## 2023-02-09 DIAGNOSIS — R5383 Other fatigue: Secondary | ICD-10-CM | POA: Diagnosis not present

## 2023-02-09 DIAGNOSIS — F411 Generalized anxiety disorder: Secondary | ICD-10-CM | POA: Diagnosis not present

## 2023-02-09 DIAGNOSIS — Z820 Family history of epilepsy and other diseases of the nervous system: Secondary | ICD-10-CM | POA: Diagnosis not present

## 2023-02-09 DIAGNOSIS — R42 Dizziness and giddiness: Secondary | ICD-10-CM | POA: Diagnosis not present

## 2023-02-09 DIAGNOSIS — G2581 Restless legs syndrome: Secondary | ICD-10-CM | POA: Diagnosis not present

## 2023-02-12 DIAGNOSIS — H6123 Impacted cerumen, bilateral: Secondary | ICD-10-CM | POA: Diagnosis not present

## 2023-02-12 DIAGNOSIS — H902 Conductive hearing loss, unspecified: Secondary | ICD-10-CM | POA: Diagnosis not present

## 2023-02-16 ENCOUNTER — Other Ambulatory Visit: Payer: Self-pay

## 2023-02-16 ENCOUNTER — Emergency Department
Admission: EM | Admit: 2023-02-16 | Discharge: 2023-02-16 | Disposition: A | Discharge status: Home / Self Care | Payer: Medicare Other | Attending: Emergency Medicine | Admitting: Emergency Medicine

## 2023-02-16 ENCOUNTER — Emergency Department: Payer: Medicare Other

## 2023-02-16 DIAGNOSIS — E871 Hypo-osmolality and hyponatremia: Secondary | ICD-10-CM | POA: Insufficient documentation

## 2023-02-16 DIAGNOSIS — R42 Dizziness and giddiness: Secondary | ICD-10-CM | POA: Diagnosis not present

## 2023-02-16 DIAGNOSIS — R110 Nausea: Secondary | ICD-10-CM | POA: Diagnosis not present

## 2023-02-16 DIAGNOSIS — Z981 Arthrodesis status: Secondary | ICD-10-CM | POA: Diagnosis not present

## 2023-02-16 DIAGNOSIS — E161 Other hypoglycemia: Secondary | ICD-10-CM | POA: Diagnosis not present

## 2023-02-16 DIAGNOSIS — R519 Headache, unspecified: Secondary | ICD-10-CM | POA: Diagnosis not present

## 2023-02-16 DIAGNOSIS — I2510 Atherosclerotic heart disease of native coronary artery without angina pectoris: Secondary | ICD-10-CM | POA: Insufficient documentation

## 2023-02-16 DIAGNOSIS — I10 Essential (primary) hypertension: Secondary | ICD-10-CM | POA: Diagnosis not present

## 2023-02-16 DIAGNOSIS — R52 Pain, unspecified: Secondary | ICD-10-CM

## 2023-02-16 LAB — CBC
HCT: 41.2 % (ref 36.0–46.0)
Hemoglobin: 14 g/dL (ref 12.0–15.0)
MCH: 30.2 pg (ref 26.0–34.0)
MCHC: 34 g/dL (ref 30.0–36.0)
MCV: 89 fL (ref 80.0–100.0)
Platelets: 284 10*3/uL (ref 150–400)
RBC: 4.63 MIL/uL (ref 3.87–5.11)
RDW: 11.9 % (ref 11.5–15.5)
WBC: 10 10*3/uL (ref 4.0–10.5)
nRBC: 0 % (ref 0.0–0.2)

## 2023-02-16 LAB — BASIC METABOLIC PANEL
Anion gap: 11 (ref 5–15)
BUN: 15 mg/dL (ref 8–23)
CO2: 25 mmol/L (ref 22–32)
Calcium: 9.5 mg/dL (ref 8.9–10.3)
Chloride: 93 mmol/L — ABNORMAL LOW (ref 98–111)
Creatinine, Ser: 0.91 mg/dL (ref 0.44–1.00)
GFR, Estimated: >60 mL/min (ref >60)
Glucose, Bld: 101 mg/dL — ABNORMAL HIGH (ref 70–99)
Potassium: 4.5 mmol/L (ref 3.5–5.1)
Sodium: 129 mmol/L — ABNORMAL LOW (ref 135–145)

## 2023-02-16 LAB — TROPONIN I (HIGH SENSITIVITY): Troponin I (High Sensitivity): 6 ng/L (ref <18)

## 2023-02-16 MED ORDER — DIAZEPAM 2 MG PO TABS: 2.0000 mg | ORAL_TABLET | Freq: Three times a day (TID) | ORAL | 0 refills | Status: DC | PRN

## 2023-02-16 MED ORDER — TRAMADOL HCL 50 MG PO TABS: 50.0000 mg | ORAL_TABLET | Freq: Two times a day (BID) | ORAL | 0 refills | Status: AC | PRN

## 2023-02-16 MED ORDER — TRAMADOL HCL 50 MG PO TABS: 50.0000 mg | ORAL_TABLET | Freq: Two times a day (BID) | ORAL | 0 refills | Status: DC | PRN

## 2023-02-16 MED ORDER — DIAZEPAM 2 MG PO TABS: 2.0000 mg | ORAL_TABLET | Freq: Three times a day (TID) | ORAL | 0 refills | Status: AC | PRN

## 2023-02-16 NOTE — ED Triage Notes (Signed)
First nurse note: EMS reports apt was feeling funny this morning and drover herfself to the FD and checked her BP and was high. Pt with hx HTN and on BP meds. Pt states was late taking BP meds this morning.   BP 204/110 HR 65 97% RA CBG 70

## 2023-02-16 NOTE — Discharge Instructions (Signed)
For your sodium:  STOP the Spironolactone x 2 days, then resume at half dose I would recommend fluid restricting to [phone] mL per day  For your neck pain:  Start taking Tylenol 1000 mg twice a day for 3-5 days, then as needed Start taking over-the-counter Alleve once a day for 3-5 days, then as needed (do NOT take ibuprofen with this) Take the Tramadol as needed for severe pain

## 2023-02-16 NOTE — ED Provider Notes (Signed)
[facility] Provider Note    Event Date/Time   First MD Initiated Contact with Patient 02/16/23 1647     (approximate)   History   Hypertension   HPI  Wendy [name] is a 77 y.o. female  with h/o HTN, CAD, chronic hyponatremia, here with HTN and dizziness. Pt reports she began to feel a dull, aching headache earlier today along with lightheadedness. She tried to measure her BP at home and it wouldn't check, so she went to the fire department. At the FD, her BP was >200/100. She was subsequently brought to the ED. Reports she feels like it was high b/c her neck pain, which is chronic, has worsened recently. She has taken some tylenol and ibuprofen w/o major relief. Takes valium at night for spasms/sleep. Denies any CP. Denies any vision changes. No focal numbness or weakness.       Physical Exam   Triage Vital Signs: ED Triage Vitals  Encounter Vitals Group     BP 02/16/23 1337 (!) 178/85     Systolic BP Percentile --      Diastolic BP Percentile --      Pulse Rate 02/16/23 1337 (!) 58     Resp 02/16/23 1337 16     Temp 02/16/23 1337 98.1 F (36.7 C)     Temp Source 02/16/23 1337 Oral     SpO2 02/16/23 1337 98 %     Weight --      Height --      Head Circumference --      Peak Flow --      Pain Score 02/16/23 1340 0     Pain Loc --      Pain Education --      Exclude from Growth Chart --     Most recent vital signs: Vitals:   02/16/23 1730 02/16/23 1910  BP: 126/85 (!) 181/87  Pulse: (!) 56 60  Resp:  16  Temp:  97.7 F (36.5 C)  SpO2: 99% 100%     General: Awake, no distress.  CV:  Good peripheral perfusion. RRR. No m/r/g. Resp:  Normal work of breathing. Lungs clear. Abd:  No distention. No tenderness. Other:  CNII-XII individually tested and intact. Strength 5/5 bl UE and LE. Normal sensation to light touch. No nystagmus. No dysmetria.   ED Results / Procedures / Treatments   Labs (all labs ordered are listed, but only  abnormal results are displayed) Labs Reviewed  BASIC METABOLIC PANEL - Abnormal; Notable for the following components:      Result Value   Sodium 129 (*)    Chloride 93 (*)    Glucose, Bld 101 (*)    All other components within normal limits  CBC  TROPONIN I (HIGH SENSITIVITY)     EKG Sinus bradycardia, VR 58. PR 160, QRS 78, QTc 412. No acute ST elevations.   RADIOLOGY CT Head: NAICA CT C Spine: Negative   I also independently reviewed and agree with radiologist interpretations.   PROCEDURES:  Critical Care performed: No   MEDICATIONS ORDERED IN ED: Medications - No data to display   IMPRESSION / MDM / ASSESSMENT AND PLAN / ED COURSE  I reviewed the triage vital signs and the nursing notes.                              Differential diagnosis includes, but is not limited to, HTN urgency,  HTN encephalopathy, anxiety, hyponatremia, polypharmacy  Patient's presentation is most consistent with acute presentation with potential threat to life or bodily function.  The patient is on the cardiac monitor to evaluate for evidence of arrhythmia and/or significant heart rate changes  Pleasant 77 yo F here with HTN, lightheadedness. Suspect HTN in setting of her chronic neck pain, which has worsened in the past few days. Her BP has improved here with reassurance and time. CT head is negative. Labs show mild acute on chronic hyponatremia, which is a known issue, but o/w are unremarkable. Trop neg and EKG is nonischemic.   Re: HTN - this has resolved. Recommend a better/more regular pain regimen and continue her hydralazine PRN at home. Re: her sodium - she has a long h/o same. Will urge fluid restriction, cutting her spironolactone briefly, and f/u with PCP.    FINAL CLINICAL IMPRESSION(S) / ED DIAGNOSES   Final diagnoses:  Primary hypertension  Chronic hyponatremia     Rx / DC Orders   ED Discharge Orders          Ordered    traMADol (ULTRAM) 50 MG tablet  Every 12  hours PRN,   Status:  Discontinued        02/16/23 1904    diazepam (VALIUM) 2 MG tablet  Every 8 hours PRN,   Status:  Discontinued        02/16/23 1904    diazepam (VALIUM) 2 MG tablet  Every 8 hours PRN        02/16/23 1919    traMADol (ULTRAM) 50 MG tablet  Every 12 hours PRN        02/16/23 1919             Note:  This document was prepared using Dragon voice recognition software and may include unintentional dictation errors.   Shaune Pollack, MD 02/16/23 [phone]

## 2023-02-26 ENCOUNTER — Other Ambulatory Visit: Payer: Self-pay | Admitting: Neurological Surgery

## 2023-02-26 DIAGNOSIS — Z6829 Body mass index (BMI) 29.0-29.9, adult: Secondary | ICD-10-CM | POA: Diagnosis not present

## 2023-02-26 DIAGNOSIS — M5416 Radiculopathy, lumbar region: Secondary | ICD-10-CM

## 2023-02-27 ENCOUNTER — Telehealth: Payer: Self-pay | Admitting: Cardiovascular Disease

## 2023-02-27 NOTE — Telephone Encounter (Signed)
 Pt c/o medication issue:  1. Name of Medication:  Leqvio   2. How are you currently taking this medication (dosage and times per day)?   3. Are you having a reaction (difficulty breathing--STAT)?   4. What is your medication issue?   Patient is requesting to speak with PharmD regarding Leqvio . She states her next injection is supposed to be on Thursday, 1/02, but the medication is causing issues.

## 2023-02-27 NOTE — Telephone Encounter (Signed)
Patient cannot make it to appointment on Thursday due to conflict.  Also concerned that the medication may be partially the cause of her back pain.    She will reach out to [place] and let us know when she is ready to move forward.

## 2023-03-01 ENCOUNTER — Ambulatory Visit: Payer: Medicare Other

## 2023-03-09 ENCOUNTER — Other Ambulatory Visit: Payer: Medicare Other

## 2023-03-10 ENCOUNTER — Other Ambulatory Visit: Payer: Medicare Other

## 2023-03-20 ENCOUNTER — Encounter: Payer: Self-pay | Admitting: Cardiovascular Disease

## 2023-03-20 ENCOUNTER — Ambulatory Visit
Admission: RE | Admit: 2023-03-20 | Discharge: 2023-03-20 | Disposition: A | Discharge status: Home / Self Care | Payer: Medicare Other | Source: Ambulatory Visit | Attending: Neurological Surgery | Admitting: Neurological Surgery

## 2023-03-20 DIAGNOSIS — M5416 Radiculopathy, lumbar region: Secondary | ICD-10-CM

## 2023-03-20 DIAGNOSIS — R2689 Other abnormalities of gait and mobility: Secondary | ICD-10-CM | POA: Diagnosis not present

## 2023-03-20 DIAGNOSIS — M5442 Lumbago with sciatica, left side: Secondary | ICD-10-CM | POA: Diagnosis not present

## 2023-03-20 DIAGNOSIS — M545 Low back pain, unspecified: Secondary | ICD-10-CM | POA: Diagnosis not present

## 2023-03-20 DIAGNOSIS — M6281 Muscle weakness (generalized): Secondary | ICD-10-CM | POA: Diagnosis not present

## 2023-03-27 DIAGNOSIS — M5442 Lumbago with sciatica, left side: Secondary | ICD-10-CM | POA: Diagnosis not present

## 2023-03-27 DIAGNOSIS — M6281 Muscle weakness (generalized): Secondary | ICD-10-CM | POA: Diagnosis not present

## 2023-03-27 DIAGNOSIS — R2689 Other abnormalities of gait and mobility: Secondary | ICD-10-CM | POA: Diagnosis not present

## 2023-03-29 ENCOUNTER — Other Ambulatory Visit: Payer: Self-pay | Admitting: Cardiovascular Disease

## 2023-03-29 DIAGNOSIS — R2689 Other abnormalities of gait and mobility: Secondary | ICD-10-CM | POA: Diagnosis not present

## 2023-03-29 DIAGNOSIS — M5442 Lumbago with sciatica, left side: Secondary | ICD-10-CM | POA: Diagnosis not present

## 2023-03-29 DIAGNOSIS — M6281 Muscle weakness (generalized): Secondary | ICD-10-CM | POA: Diagnosis not present

## 2023-03-29 DIAGNOSIS — I10 Essential (primary) hypertension: Secondary | ICD-10-CM

## 2023-04-03 ENCOUNTER — Other Ambulatory Visit: Payer: Self-pay | Admitting: Obstetrics & Gynecology

## 2023-04-03 DIAGNOSIS — Z1231 Encounter for screening mammogram for malignant neoplasm of breast: Secondary | ICD-10-CM

## 2023-04-03 DIAGNOSIS — R2689 Other abnormalities of gait and mobility: Secondary | ICD-10-CM | POA: Diagnosis not present

## 2023-04-03 DIAGNOSIS — M6281 Muscle weakness (generalized): Secondary | ICD-10-CM | POA: Diagnosis not present

## 2023-04-03 DIAGNOSIS — M5442 Lumbago with sciatica, left side: Secondary | ICD-10-CM | POA: Diagnosis not present

## 2023-04-05 DIAGNOSIS — M6281 Muscle weakness (generalized): Secondary | ICD-10-CM | POA: Diagnosis not present

## 2023-04-05 DIAGNOSIS — M5442 Lumbago with sciatica, left side: Secondary | ICD-10-CM | POA: Diagnosis not present

## 2023-04-05 DIAGNOSIS — R2689 Other abnormalities of gait and mobility: Secondary | ICD-10-CM | POA: Diagnosis not present

## 2023-04-10 DIAGNOSIS — M6281 Muscle weakness (generalized): Secondary | ICD-10-CM | POA: Diagnosis not present

## 2023-04-10 DIAGNOSIS — R2689 Other abnormalities of gait and mobility: Secondary | ICD-10-CM | POA: Diagnosis not present

## 2023-04-10 DIAGNOSIS — M5442 Lumbago with sciatica, left side: Secondary | ICD-10-CM | POA: Diagnosis not present

## 2023-04-11 ENCOUNTER — Telehealth: Payer: Self-pay

## 2023-04-11 DIAGNOSIS — M542 Cervicalgia: Secondary | ICD-10-CM | POA: Diagnosis not present

## 2023-04-11 DIAGNOSIS — M5416 Radiculopathy, lumbar region: Secondary | ICD-10-CM | POA: Diagnosis not present

## 2023-04-11 DIAGNOSIS — Z6828 Body mass index (BMI) 28.0-28.9, adult: Secondary | ICD-10-CM | POA: Diagnosis not present

## 2023-04-11 NOTE — Telephone Encounter (Signed)
Auth Submission: NO AUTH NEEDED Payer: medicare [employer] [place] Medication & CPT/J Code(s) submitted: Leqvio (Inclisiran) [phone] Route of submission (phone, fax, portal):  Phone # Fax # Auth type: Buy/Bill Units/visits requested: 284mg  x 2 doses Reference number: [email or web address] biv Approval from: 05/04/22 to 03/29/24

## 2023-04-12 DIAGNOSIS — M5442 Lumbago with sciatica, left side: Secondary | ICD-10-CM | POA: Diagnosis not present

## 2023-04-12 DIAGNOSIS — M6281 Muscle weakness (generalized): Secondary | ICD-10-CM | POA: Diagnosis not present

## 2023-04-12 DIAGNOSIS — R2689 Other abnormalities of gait and mobility: Secondary | ICD-10-CM | POA: Diagnosis not present

## 2023-04-19 ENCOUNTER — Other Ambulatory Visit: Payer: Self-pay | Admitting: Cardiovascular Disease

## 2023-04-19 DIAGNOSIS — R293 Abnormal posture: Secondary | ICD-10-CM | POA: Diagnosis not present

## 2023-04-19 DIAGNOSIS — R2689 Other abnormalities of gait and mobility: Secondary | ICD-10-CM | POA: Diagnosis not present

## 2023-04-19 DIAGNOSIS — M542 Cervicalgia: Secondary | ICD-10-CM | POA: Diagnosis not present

## 2023-04-19 DIAGNOSIS — M6281 Muscle weakness (generalized): Secondary | ICD-10-CM | POA: Diagnosis not present

## 2023-04-19 DIAGNOSIS — I10 Essential (primary) hypertension: Secondary | ICD-10-CM

## 2023-04-19 DIAGNOSIS — M5442 Lumbago with sciatica, left side: Secondary | ICD-10-CM | POA: Diagnosis not present

## 2023-04-26 DIAGNOSIS — M5442 Lumbago with sciatica, left side: Secondary | ICD-10-CM | POA: Diagnosis not present

## 2023-04-26 DIAGNOSIS — R2689 Other abnormalities of gait and mobility: Secondary | ICD-10-CM | POA: Diagnosis not present

## 2023-04-26 DIAGNOSIS — M542 Cervicalgia: Secondary | ICD-10-CM | POA: Diagnosis not present

## 2023-04-26 DIAGNOSIS — M6281 Muscle weakness (generalized): Secondary | ICD-10-CM | POA: Diagnosis not present

## 2023-04-26 DIAGNOSIS — R293 Abnormal posture: Secondary | ICD-10-CM | POA: Diagnosis not present

## 2023-05-01 ENCOUNTER — Ambulatory Visit
Admission: RE | Admit: 2023-05-01 | Discharge: 2023-05-01 | Disposition: A | Discharge status: Home / Self Care | Payer: Medicare Other | Source: Ambulatory Visit | Attending: Obstetrics & Gynecology | Admitting: Obstetrics & Gynecology

## 2023-05-01 DIAGNOSIS — Z1231 Encounter for screening mammogram for malignant neoplasm of breast: Secondary | ICD-10-CM

## 2023-05-03 DIAGNOSIS — R2689 Other abnormalities of gait and mobility: Secondary | ICD-10-CM | POA: Diagnosis not present

## 2023-05-03 DIAGNOSIS — R293 Abnormal posture: Secondary | ICD-10-CM | POA: Diagnosis not present

## 2023-05-03 DIAGNOSIS — M6281 Muscle weakness (generalized): Secondary | ICD-10-CM | POA: Diagnosis not present

## 2023-05-03 DIAGNOSIS — M542 Cervicalgia: Secondary | ICD-10-CM | POA: Diagnosis not present

## 2023-05-03 DIAGNOSIS — M5442 Lumbago with sciatica, left side: Secondary | ICD-10-CM | POA: Diagnosis not present

## 2023-05-07 DIAGNOSIS — H6123 Impacted cerumen, bilateral: Secondary | ICD-10-CM | POA: Diagnosis not present

## 2023-05-07 DIAGNOSIS — H902 Conductive hearing loss, unspecified: Secondary | ICD-10-CM | POA: Diagnosis not present

## 2023-05-08 DIAGNOSIS — M6281 Muscle weakness (generalized): Secondary | ICD-10-CM | POA: Diagnosis not present

## 2023-05-08 DIAGNOSIS — M542 Cervicalgia: Secondary | ICD-10-CM | POA: Diagnosis not present

## 2023-05-08 DIAGNOSIS — R293 Abnormal posture: Secondary | ICD-10-CM | POA: Diagnosis not present

## 2023-05-08 DIAGNOSIS — M5442 Lumbago with sciatica, left side: Secondary | ICD-10-CM | POA: Diagnosis not present

## 2023-05-08 DIAGNOSIS — R2689 Other abnormalities of gait and mobility: Secondary | ICD-10-CM | POA: Diagnosis not present

## 2023-05-09 ENCOUNTER — Other Ambulatory Visit: Payer: Self-pay | Admitting: Cardiovascular Disease

## 2023-05-09 DIAGNOSIS — I10 Essential (primary) hypertension: Secondary | ICD-10-CM

## 2023-05-09 DIAGNOSIS — R82998 Other abnormal findings in urine: Secondary | ICD-10-CM | POA: Diagnosis not present

## 2023-05-09 DIAGNOSIS — N3000 Acute cystitis without hematuria: Secondary | ICD-10-CM | POA: Diagnosis not present

## 2023-05-09 DIAGNOSIS — N398 Other specified disorders of urinary system: Secondary | ICD-10-CM | POA: Diagnosis not present

## 2023-05-09 DIAGNOSIS — N952 Postmenopausal atrophic vaginitis: Secondary | ICD-10-CM | POA: Diagnosis not present

## 2023-05-09 DIAGNOSIS — R8290 Unspecified abnormal findings in urine: Secondary | ICD-10-CM | POA: Diagnosis not present

## 2023-05-17 DIAGNOSIS — R293 Abnormal posture: Secondary | ICD-10-CM | POA: Diagnosis not present

## 2023-05-17 DIAGNOSIS — M542 Cervicalgia: Secondary | ICD-10-CM | POA: Diagnosis not present

## 2023-05-17 DIAGNOSIS — M5442 Lumbago with sciatica, left side: Secondary | ICD-10-CM | POA: Diagnosis not present

## 2023-05-17 DIAGNOSIS — R2689 Other abnormalities of gait and mobility: Secondary | ICD-10-CM | POA: Diagnosis not present

## 2023-05-17 DIAGNOSIS — M6281 Muscle weakness (generalized): Secondary | ICD-10-CM | POA: Diagnosis not present

## 2023-05-21 DIAGNOSIS — M545 Low back pain, unspecified: Secondary | ICD-10-CM | POA: Diagnosis not present

## 2023-05-21 DIAGNOSIS — Z85828 Personal history of other malignant neoplasm of skin: Secondary | ICD-10-CM | POA: Diagnosis not present

## 2023-05-21 DIAGNOSIS — L570 Actinic keratosis: Secondary | ICD-10-CM | POA: Diagnosis not present

## 2023-05-21 DIAGNOSIS — L814 Other melanin hyperpigmentation: Secondary | ICD-10-CM | POA: Diagnosis not present

## 2023-05-21 DIAGNOSIS — D225 Melanocytic nevi of trunk: Secondary | ICD-10-CM | POA: Diagnosis not present

## 2023-05-21 DIAGNOSIS — Z85820 Personal history of malignant melanoma of skin: Secondary | ICD-10-CM | POA: Diagnosis not present

## 2023-05-21 DIAGNOSIS — R3912 Poor urinary stream: Secondary | ICD-10-CM | POA: Diagnosis not present

## 2023-05-21 DIAGNOSIS — L821 Other seborrheic keratosis: Secondary | ICD-10-CM | POA: Diagnosis not present

## 2023-05-21 DIAGNOSIS — L910 Hypertrophic scar: Secondary | ICD-10-CM | POA: Diagnosis not present

## 2023-05-21 DIAGNOSIS — D224 Melanocytic nevi of scalp and neck: Secondary | ICD-10-CM | POA: Diagnosis not present

## 2023-05-21 DIAGNOSIS — D1801 Hemangioma of skin and subcutaneous tissue: Secondary | ICD-10-CM | POA: Diagnosis not present

## 2023-05-29 DIAGNOSIS — E785 Hyperlipidemia, unspecified: Secondary | ICD-10-CM | POA: Diagnosis not present

## 2023-05-29 DIAGNOSIS — E871 Hypo-osmolality and hyponatremia: Secondary | ICD-10-CM | POA: Diagnosis not present

## 2023-05-29 DIAGNOSIS — M609 Myositis, unspecified: Secondary | ICD-10-CM | POA: Diagnosis not present

## 2023-05-29 DIAGNOSIS — C4330 Malignant melanoma of unspecified part of face: Secondary | ICD-10-CM | POA: Diagnosis not present

## 2023-05-29 DIAGNOSIS — I10 Essential (primary) hypertension: Secondary | ICD-10-CM | POA: Diagnosis not present

## 2023-05-29 DIAGNOSIS — R82998 Other abnormal findings in urine: Secondary | ICD-10-CM | POA: Diagnosis not present

## 2023-05-29 DIAGNOSIS — I2584 Coronary atherosclerosis due to calcified coronary lesion: Secondary | ICD-10-CM | POA: Diagnosis not present

## 2023-05-29 DIAGNOSIS — M5416 Radiculopathy, lumbar region: Secondary | ICD-10-CM | POA: Diagnosis not present

## 2023-05-29 DIAGNOSIS — I679 Cerebrovascular disease, unspecified: Secondary | ICD-10-CM | POA: Diagnosis not present

## 2023-05-29 DIAGNOSIS — E222 Syndrome of inappropriate secretion of antidiuretic hormone: Secondary | ICD-10-CM | POA: Diagnosis not present

## 2023-05-29 DIAGNOSIS — I700 Atherosclerosis of aorta: Secondary | ICD-10-CM | POA: Diagnosis not present

## 2023-05-29 DIAGNOSIS — F419 Anxiety disorder, unspecified: Secondary | ICD-10-CM | POA: Diagnosis not present

## 2023-05-29 DIAGNOSIS — G4700 Insomnia, unspecified: Secondary | ICD-10-CM | POA: Diagnosis not present

## 2023-06-13 DIAGNOSIS — H35363 Drusen (degenerative) of macula, bilateral: Secondary | ICD-10-CM | POA: Diagnosis not present

## 2023-07-12 ENCOUNTER — Other Ambulatory Visit: Payer: Self-pay | Admitting: Obstetrics & Gynecology

## 2023-07-12 DIAGNOSIS — E2839 Other primary ovarian failure: Secondary | ICD-10-CM

## 2023-07-18 DIAGNOSIS — H6123 Impacted cerumen, bilateral: Secondary | ICD-10-CM | POA: Diagnosis not present

## 2023-07-18 DIAGNOSIS — H902 Conductive hearing loss, unspecified: Secondary | ICD-10-CM | POA: Diagnosis not present

## 2023-08-07 DIAGNOSIS — R1030 Lower abdominal pain, unspecified: Secondary | ICD-10-CM | POA: Diagnosis not present

## 2023-08-07 DIAGNOSIS — R6883 Chills (without fever): Secondary | ICD-10-CM | POA: Diagnosis not present

## 2023-08-07 DIAGNOSIS — R112 Nausea with vomiting, unspecified: Secondary | ICD-10-CM | POA: Diagnosis not present

## 2023-08-07 DIAGNOSIS — R52 Pain, unspecified: Secondary | ICD-10-CM | POA: Diagnosis not present

## 2023-08-08 DIAGNOSIS — R102 Pelvic and perineal pain: Secondary | ICD-10-CM | POA: Diagnosis not present

## 2023-08-08 DIAGNOSIS — I2584 Coronary atherosclerosis due to calcified coronary lesion: Secondary | ICD-10-CM | POA: Diagnosis not present

## 2023-08-08 DIAGNOSIS — K219 Gastro-esophageal reflux disease without esophagitis: Secondary | ICD-10-CM | POA: Diagnosis not present

## 2023-08-08 DIAGNOSIS — K5909 Other constipation: Secondary | ICD-10-CM | POA: Diagnosis not present

## 2023-08-08 DIAGNOSIS — R1030 Lower abdominal pain, unspecified: Secondary | ICD-10-CM | POA: Diagnosis not present

## 2023-08-08 DIAGNOSIS — I10 Essential (primary) hypertension: Secondary | ICD-10-CM | POA: Diagnosis not present

## 2023-08-08 DIAGNOSIS — K5730 Diverticulosis of large intestine without perforation or abscess without bleeding: Secondary | ICD-10-CM | POA: Diagnosis not present

## 2023-08-09 ENCOUNTER — Other Ambulatory Visit (HOSPITAL_COMMUNITY): Payer: Self-pay | Admitting: Gastroenterology

## 2023-08-09 DIAGNOSIS — Z860100 Personal history of colon polyps, unspecified: Secondary | ICD-10-CM | POA: Diagnosis not present

## 2023-08-09 DIAGNOSIS — R110 Nausea: Secondary | ICD-10-CM | POA: Diagnosis not present

## 2023-08-09 DIAGNOSIS — K5730 Diverticulosis of large intestine without perforation or abscess without bleeding: Secondary | ICD-10-CM | POA: Diagnosis not present

## 2023-08-09 DIAGNOSIS — K219 Gastro-esophageal reflux disease without esophagitis: Secondary | ICD-10-CM | POA: Diagnosis not present

## 2023-08-09 DIAGNOSIS — R1011 Right upper quadrant pain: Secondary | ICD-10-CM | POA: Diagnosis not present

## 2023-08-09 DIAGNOSIS — K5900 Constipation, unspecified: Secondary | ICD-10-CM | POA: Diagnosis not present

## 2023-08-14 ENCOUNTER — Ambulatory Visit
Admission: RE | Admit: 2023-08-14 | Discharge: 2023-08-14 | Disposition: A | Discharge status: Home / Self Care | Source: Ambulatory Visit | Attending: Gastroenterology | Admitting: Gastroenterology

## 2023-08-14 ENCOUNTER — Encounter
Admission: RE | Admit: 2023-08-14 | Discharge: 2023-08-14 | Disposition: A | Discharge status: Home / Self Care | Source: Ambulatory Visit | Attending: Gastroenterology | Admitting: Gastroenterology

## 2023-08-14 DIAGNOSIS — R1011 Right upper quadrant pain: Secondary | ICD-10-CM | POA: Diagnosis not present

## 2023-08-14 MED ORDER — TECHNETIUM TC 99M MEBROFENIN IV KIT
5.1000 | PACK | Freq: Once | INTRAVENOUS | Status: AC | PRN
  Administered 2023-08-14: 5.1 via INTRAVENOUS

## 2023-08-22 DIAGNOSIS — D1801 Hemangioma of skin and subcutaneous tissue: Secondary | ICD-10-CM | POA: Diagnosis not present

## 2023-08-22 DIAGNOSIS — Z85828 Personal history of other malignant neoplasm of skin: Secondary | ICD-10-CM | POA: Diagnosis not present

## 2023-08-22 DIAGNOSIS — Z85820 Personal history of malignant melanoma of skin: Secondary | ICD-10-CM | POA: Diagnosis not present

## 2023-08-22 DIAGNOSIS — L570 Actinic keratosis: Secondary | ICD-10-CM | POA: Diagnosis not present

## 2023-08-22 DIAGNOSIS — L821 Other seborrheic keratosis: Secondary | ICD-10-CM | POA: Diagnosis not present

## 2023-08-22 DIAGNOSIS — L905 Scar conditions and fibrosis of skin: Secondary | ICD-10-CM | POA: Diagnosis not present

## 2023-08-22 DIAGNOSIS — L910 Hypertrophic scar: Secondary | ICD-10-CM | POA: Diagnosis not present

## 2023-08-22 DIAGNOSIS — D225 Melanocytic nevi of trunk: Secondary | ICD-10-CM | POA: Diagnosis not present

## 2023-08-22 DIAGNOSIS — L814 Other melanin hyperpigmentation: Secondary | ICD-10-CM | POA: Diagnosis not present

## 2023-09-04 ENCOUNTER — Ambulatory Visit
Admission: RE | Admit: 2023-09-04 | Discharge: 2023-09-04 | Disposition: A | Discharge status: Home / Self Care | Source: Ambulatory Visit | Attending: Obstetrics & Gynecology | Admitting: Obstetrics & Gynecology

## 2023-09-04 DIAGNOSIS — E2839 Other primary ovarian failure: Secondary | ICD-10-CM | POA: Insufficient documentation

## 2023-09-04 DIAGNOSIS — M8588 Other specified disorders of bone density and structure, other site: Secondary | ICD-10-CM | POA: Diagnosis not present

## 2023-09-04 DIAGNOSIS — Z780 Asymptomatic menopausal state: Secondary | ICD-10-CM | POA: Diagnosis not present

## 2023-10-01 DIAGNOSIS — H93293 Other abnormal auditory perceptions, bilateral: Secondary | ICD-10-CM | POA: Diagnosis not present

## 2023-10-01 DIAGNOSIS — H6123 Impacted cerumen, bilateral: Secondary | ICD-10-CM | POA: Diagnosis not present

## 2023-10-01 DIAGNOSIS — H903 Sensorineural hearing loss, bilateral: Secondary | ICD-10-CM | POA: Diagnosis not present

## 2023-10-17 DIAGNOSIS — Z01419 Encounter for gynecological examination (general) (routine) without abnormal findings: Secondary | ICD-10-CM | POA: Diagnosis not present

## 2023-11-09 DIAGNOSIS — D225 Melanocytic nevi of trunk: Secondary | ICD-10-CM | POA: Diagnosis not present

## 2023-11-09 DIAGNOSIS — L910 Hypertrophic scar: Secondary | ICD-10-CM | POA: Diagnosis not present

## 2023-11-09 DIAGNOSIS — L814 Other melanin hyperpigmentation: Secondary | ICD-10-CM | POA: Diagnosis not present

## 2023-11-09 DIAGNOSIS — D2262 Melanocytic nevi of left upper limb, including shoulder: Secondary | ICD-10-CM | POA: Diagnosis not present

## 2023-11-09 DIAGNOSIS — L570 Actinic keratosis: Secondary | ICD-10-CM | POA: Diagnosis not present

## 2023-11-09 DIAGNOSIS — D2261 Melanocytic nevi of right upper limb, including shoulder: Secondary | ICD-10-CM | POA: Diagnosis not present

## 2023-11-09 DIAGNOSIS — Z85820 Personal history of malignant melanoma of skin: Secondary | ICD-10-CM | POA: Diagnosis not present

## 2023-11-09 DIAGNOSIS — D485 Neoplasm of uncertain behavior of skin: Secondary | ICD-10-CM | POA: Diagnosis not present

## 2023-11-09 DIAGNOSIS — Z85828 Personal history of other malignant neoplasm of skin: Secondary | ICD-10-CM | POA: Diagnosis not present

## 2023-11-09 DIAGNOSIS — L821 Other seborrheic keratosis: Secondary | ICD-10-CM | POA: Diagnosis not present

## 2023-11-26 DIAGNOSIS — H6123 Impacted cerumen, bilateral: Secondary | ICD-10-CM | POA: Diagnosis not present

## 2023-11-26 DIAGNOSIS — H902 Conductive hearing loss, unspecified: Secondary | ICD-10-CM | POA: Diagnosis not present

## 2023-12-21 DIAGNOSIS — Z23 Encounter for immunization: Secondary | ICD-10-CM | POA: Diagnosis not present

## 2023-12-31 ENCOUNTER — Ambulatory Visit (INDEPENDENT_AMBULATORY_CARE_PROVIDER_SITE_OTHER): Admitting: Cardiovascular Disease

## 2023-12-31 ENCOUNTER — Encounter (HOSPITAL_BASED_OUTPATIENT_CLINIC_OR_DEPARTMENT_OTHER): Payer: Self-pay | Admitting: Cardiovascular Disease

## 2023-12-31 VITALS — BP 138/70 | HR 68 | Ht 60.0 in | Wt 149.0 lb

## 2023-12-31 DIAGNOSIS — I10 Essential (primary) hypertension: Secondary | ICD-10-CM | POA: Diagnosis not present

## 2023-12-31 DIAGNOSIS — I6523 Occlusion and stenosis of bilateral carotid arteries: Secondary | ICD-10-CM

## 2023-12-31 DIAGNOSIS — I700 Atherosclerosis of aorta: Secondary | ICD-10-CM

## 2023-12-31 NOTE — Progress Notes (Unsigned)
 Cardiology Office Note:  .   Date:  01/01/2024  ID:  Wendy [name], DOB December 16, 1945, MRN [id number] PCP: Nichole Senior, MD  [facility] Providers Cardiologist:  Aleene Passe, MD (Inactive)    History of Present Illness: .    Wendy [name] is a 78 y.o. female with coronary calcification, hypertension, hyperlipidemia and carotid stenosis here for follow up.  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  She has followed with vascular been managed medically.  She was on spironolactone  which was held 2/2 hyponatremia.  She hasn't tolerated higher doses of statins 2/2 myalgias.  She was starte on Inclisiran.   Discussed the use of AI scribe software for clinical note transcription with the patient, who gave verbal consent to proceed.  History of Present Illness [name] has a long-standing history of hypertension since menopause, with blood pressure typically ranging from 130/80 mmHg to 140/80 mmHg. She maintains an active lifestyle, walking three to four times a week for three miles, and incorporates additional physical activity by parking further away and taking stairs. No chest pain or dyspnea occurs during exercise. She denies peripheral edema, attributing foot problems to a back issue.  She has a history of hyponatremia, with sodium levels dropping as low as 112 mmol/L during a COVID-19 hospitalization two years ago. Her sodium levels are usually around 130 mmol/L. She does not follow a low sodium diet and is not experiencing swelling. Her current medications include spironolactone  25 mg, valsartan  80 mg twice daily, and metoprolol  25 mg once daily. Hydralazine  10 mg is available for blood pressure spikes, though it has not been used recently.  There is a family history of hypertension and heart failure, with her mother having had high blood pressure and dying of heart failure. She is aware of mild carotid artery blockage from a 2021 ultrasound.  She  experiences back pain, which she associates with a vertebrae issue affecting her legs and feet, possibly causing neuropathy. She manages this with acupuncture and massage. She previously tried inclisiran for cholesterol management but discontinued it due to back pain concerns. She takes red yeast rice supplements for cholesterol management, which she has tolerated well for years.  Her cholesterol levels were well-controlled with inclisiran, with an LDL of 46 mg/dL in April 2025, but she has since stopped the medication.  ROS:  As per HPI  Studies Reviewed: SABRA   EKG Interpretation Date/Time:  Monday December 31 2023 16:24:31 EST Ventricular Rate:  68 PR Interval:  154 QRS Duration:  76 QT Interval:  380 QTC Calculation: 404 R Axis:   36  Text Interpretation: Normal sinus rhythm Normal ECG When compared with ECG of 16-Feb-2023 18:01, No significant change was found Confirmed by Raford Riggs ([id number]) on 12/31/2023 4:57:10 PM   Zio Monitor 01/2022:   Predominate rhythm is sinus rhythm.   1 run of nonsustained SVT  - last 8 beats with avg. H Rof 168   Rare PACs, rare PVCs   No significant arrhythmias observed     Patch Wear Time:  6 days and 18 hours (2023-12-09T13:54:03-498 to 2023-12-16T08:33:26-0500)   Patient had a min HR of 47 bpm, max HR of 174 bpm, and avg HR of 63 bpm. Predominant underlying rhythm was Sinus Rhythm. 1 run of Supraventricular Tachycardia occurred lasting 8 beats with a max rate of 174 bpm (avg 168 bpm). Isolated SVEs were rare  (<1.0%), SVE Couplets were rare (<1.0%), and SVE Triplets were rare (<  1.0%). Isolated VEs were rare (<1.0%), and no VE Couplets or VE Triplets were present.   Cardiac CT-A 11/2021: IMPRESSION: 1. Total calcium  score of 103 is at percentile 58 for subjects of the same age, gender, and race/ethnicity. 2. Aortic Atherosclerosis (ICD10-I70.0).  Risk Assessment/Calculations:             Physical Exam:   VS:  BP 138/70 (BP Location:  Left Arm, Patient Position: Sitting, Cuff Size: Normal)   Pulse 68   Ht 5' (1.524 m)   Wt 149 lb (67.6 kg)   SpO2 98%   BMI 29.10 kg/m  , BMI Body mass index is 29.1 kg/m. GENERAL:  Well appearing HEENT: Pupils equal round and reactive, fundi not visualized, oral mucosa unremarkable NECK:  No jugular venous distention, waveform within normal limits, carotid upstroke brisk and symmetric, no bruits, no thyromegaly LUNGS:  Clear to auscultation bilaterally HEART:  RRR.  PMI not displaced or sustained,S1 and S2 within normal limits, no S3, no S4, no clicks, no rubs, no murmurs ABD:  Flat, positive bowel sounds normal in frequency in pitch, no bruits, no rebound, no guarding, no midline pulsatile mass, no hepatomegaly, no splenomegaly EXT:  2 plus pulses throughout, no edema, no cyanosis no clubbing SKIN:  No rashes no nodules NEURO:  Cranial nerves II through XII grossly intact, motor grossly intact throughout PSYCH:  Cognitively intact, oriented to person place and time   ASSESSMENT AND PLAN: .    Assessment & Plan # Primary hypertension Blood pressure ranges from 130/80 to 140/80 mmHg. Discussed potential switch from metoprolol  to a stronger beta blocker. Emphasized tight blood pressure control to reduce cardiovascular risk. Discussed turmeric and beet supplements as adjuncts. - Continue valsartan  80 mg twice daily. - Continue spironolactone  25 mg daily. - Continue metoprolol  25 mg daily. - Use hydralazine  10 mg as needed for elevated blood pressure. - Start turmeric supplement, which she thinks will help her pain and therefore lower her blood pressure. - Track blood pressure readings morning and evening for two months. - Reassess blood pressure control in two months.  Goal is less than 130/80.  # Hyponatremia Sodium levels range from 130 to 134 mmol/L. Low sodium usually due to excess fluid rather than dietary intake. - Monitor sodium levels during upcoming physical examination. -  Avoid thiazide diuretic  # Bilateral mild carotid artery stenosis Mild stenosis noted on 2021 ultrasound. Discussed importance of maintaining cholesterol levels to prevent progression. - Order carotid ultrasound to reassess stenosis. - Continue aspirin  81 mg daily  # Hyperlipidemia Previously on inclisiran with good LDL reduction but discontinued due to back pain. Not on statins due to muscle pain. Discussed potential use of low-dose rosuvastatin if cholesterol levels are elevated. - Consider starting rosuvastatin at a low dose if cholesterol levels are elevated. - Check cholesterol levels during upcoming physical examination.          Dispo: f/u 6 months  Signed, Annabella Scarce, MD

## 2023-12-31 NOTE — Patient Instructions (Addendum)
 Medication Instructions:  Your physician recommends that you continue on your current medications as directed. Please refer to the Current Medication list given to you today.   *If you need a refill on your cardiac medications before your next appointment, please call your pharmacy*  Lab Work: HAVE YOUR PRIMARY SEND YOUR LIPIDS WHEN YOU HAVE THEM DONE   If you have labs (blood work) drawn today and your tests are completely normal, you will receive your results only by: MyChart Message (if you have MyChart) OR A paper copy in the mail If you have any lab test that is abnormal or we need to change your treatment, we will call you to review the results.  Testing/Procedures: Your physician has requested that you have a carotid duplex. This test is an ultrasound of the carotid arteries in your neck. It looks at blood flow through these arteries that supply the brain with blood. Allow one hour for this exam. There are no restrictions or special instructions.  Follow-Up: At [facility], you and your health needs are our priority.  As part of our continuing mission to provide you with exceptional heart care, our providers are all part of one team.  This team includes your primary Cardiologist (physician) and Advanced Practice Providers or APPs (Physician Assistants and Nurse Practitioners) who all work together to provide you with the care you need, when you need it.  Your next appointment:   8 TO 10 WEEKS   Provider:   Annabella Scarce, MD, Rosaline Bane, NP, or Reche Finder, NP   1 YEAR WITH DR [facility]    We recommend signing up for the patient portal called MyChart.  Sign up information is provided on this After Visit Summary.  MyChart is used to connect with patients for Virtual Visits (Telemedicine).  Patients are able to view lab/test results, encounter notes, upcoming appointments, etc.  Non-urgent messages can be sent to your provider as well.   To learn more about what  you can do with MyChart, go to [email or web address].

## 2024-01-01 ENCOUNTER — Encounter (HOSPITAL_BASED_OUTPATIENT_CLINIC_OR_DEPARTMENT_OTHER): Payer: Self-pay | Admitting: Cardiovascular Disease

## 2024-01-04 ENCOUNTER — Ambulatory Visit (HOSPITAL_BASED_OUTPATIENT_CLINIC_OR_DEPARTMENT_OTHER): Admitting: Cardiovascular Disease

## 2024-01-14 ENCOUNTER — Ambulatory Visit (HOSPITAL_BASED_OUTPATIENT_CLINIC_OR_DEPARTMENT_OTHER): Admitting: Cardiology

## 2024-01-16 DIAGNOSIS — Z1212 Encounter for screening for malignant neoplasm of rectum: Secondary | ICD-10-CM | POA: Diagnosis not present

## 2024-01-16 DIAGNOSIS — E538 Deficiency of other specified B group vitamins: Secondary | ICD-10-CM | POA: Diagnosis not present

## 2024-01-17 ENCOUNTER — Ambulatory Visit (INDEPENDENT_AMBULATORY_CARE_PROVIDER_SITE_OTHER)

## 2024-01-17 DIAGNOSIS — I6523 Occlusion and stenosis of bilateral carotid arteries: Secondary | ICD-10-CM

## 2024-01-21 DIAGNOSIS — I10 Essential (primary) hypertension: Secondary | ICD-10-CM | POA: Diagnosis not present

## 2024-01-21 DIAGNOSIS — Z1212 Encounter for screening for malignant neoplasm of rectum: Secondary | ICD-10-CM | POA: Diagnosis not present

## 2024-01-21 DIAGNOSIS — E538 Deficiency of other specified B group vitamins: Secondary | ICD-10-CM | POA: Diagnosis not present

## 2024-01-21 DIAGNOSIS — R82998 Other abnormal findings in urine: Secondary | ICD-10-CM | POA: Diagnosis not present

## 2024-01-22 DIAGNOSIS — H900 Conductive hearing loss, bilateral: Secondary | ICD-10-CM | POA: Diagnosis not present

## 2024-01-22 DIAGNOSIS — H6123 Impacted cerumen, bilateral: Secondary | ICD-10-CM | POA: Diagnosis not present

## 2024-01-26 ENCOUNTER — Ambulatory Visit: Payer: Self-pay | Admitting: Cardiovascular Disease

## 2024-02-01 ENCOUNTER — Other Ambulatory Visit: Payer: Self-pay | Admitting: Physician Assistant

## 2024-02-01 DIAGNOSIS — D485 Neoplasm of uncertain behavior of skin: Secondary | ICD-10-CM | POA: Diagnosis not present

## 2024-02-01 DIAGNOSIS — I10 Essential (primary) hypertension: Secondary | ICD-10-CM

## 2024-02-01 DIAGNOSIS — L570 Actinic keratosis: Secondary | ICD-10-CM | POA: Diagnosis not present

## 2024-02-01 DIAGNOSIS — L578 Other skin changes due to chronic exposure to nonionizing radiation: Secondary | ICD-10-CM | POA: Diagnosis not present

## 2024-02-01 DIAGNOSIS — Z85828 Personal history of other malignant neoplasm of skin: Secondary | ICD-10-CM | POA: Diagnosis not present

## 2024-02-01 DIAGNOSIS — L821 Other seborrheic keratosis: Secondary | ICD-10-CM | POA: Diagnosis not present

## 2024-02-01 DIAGNOSIS — Z85820 Personal history of malignant melanoma of skin: Secondary | ICD-10-CM | POA: Diagnosis not present

## 2024-02-01 DIAGNOSIS — D225 Melanocytic nevi of trunk: Secondary | ICD-10-CM | POA: Diagnosis not present

## 2024-02-06 MED ORDER — SPIRONOLACTONE 25 MG PO TABS: 25.0000 mg | ORAL_TABLET | Freq: Every day | ORAL | 3 refills | Status: DC

## 2024-03-04 ENCOUNTER — Telehealth (HOSPITAL_COMMUNITY): Payer: Self-pay | Admitting: Pharmacist

## 2024-03-04 NOTE — Addendum Note (Signed)
 Addended by: DAYNE SHERRY RAMAN on: 03/04/2024 01:02 PM   Modules accepted: Orders

## 2024-03-04 NOTE — Telephone Encounter (Signed)
 Leqvio  treatment plan discontinued due to experiencing back pain with doses  Kallyn Demarcus, PharmD, MPH, BCPS, CPP Clinical Pharmacist

## 2024-03-07 ENCOUNTER — Other Ambulatory Visit: Payer: Self-pay | Admitting: Nurse Practitioner

## 2024-03-07 ENCOUNTER — Ambulatory Visit
Admission: RE | Admit: 2024-03-07 | Discharge: 2024-03-07 | Disposition: A | Source: Ambulatory Visit | Attending: Nurse Practitioner

## 2024-03-07 DIAGNOSIS — R110 Nausea: Secondary | ICD-10-CM

## 2024-03-07 DIAGNOSIS — D72825 Bandemia: Secondary | ICD-10-CM

## 2024-03-07 DIAGNOSIS — R1030 Lower abdominal pain, unspecified: Secondary | ICD-10-CM

## 2024-03-07 MED ORDER — IOPAMIDOL (ISOVUE-300) INJECTION 61%
100.0000 mL | Freq: Once | INTRAVENOUS | Status: AC | PRN
  Administered 2024-03-07: 100 mL via INTRAVENOUS

## 2024-03-14 ENCOUNTER — Ambulatory Visit (INDEPENDENT_AMBULATORY_CARE_PROVIDER_SITE_OTHER): Admitting: Family

## 2024-03-14 ENCOUNTER — Encounter (HOSPITAL_BASED_OUTPATIENT_CLINIC_OR_DEPARTMENT_OTHER): Payer: Self-pay | Admitting: Family

## 2024-03-14 VITALS — BP 130/68 | HR 61 | Ht 60.0 in | Wt 147.9 lb

## 2024-03-14 DIAGNOSIS — E785 Hyperlipidemia, unspecified: Secondary | ICD-10-CM

## 2024-03-14 DIAGNOSIS — I25118 Atherosclerotic heart disease of native coronary artery with other forms of angina pectoris: Secondary | ICD-10-CM

## 2024-03-14 DIAGNOSIS — I10 Essential (primary) hypertension: Secondary | ICD-10-CM

## 2024-03-14 DIAGNOSIS — I6523 Occlusion and stenosis of bilateral carotid arteries: Secondary | ICD-10-CM

## 2024-03-14 DIAGNOSIS — I700 Atherosclerosis of aorta: Secondary | ICD-10-CM

## 2024-03-14 DIAGNOSIS — E871 Hypo-osmolality and hyponatremia: Secondary | ICD-10-CM

## 2024-03-14 MED ORDER — HYDRALAZINE HCL 10 MG PO TABS: 10.0000 mg | ORAL_TABLET | Freq: Three times a day (TID) | ORAL | 1 refills | Status: AC | PRN

## 2024-03-14 NOTE — Patient Instructions (Addendum)
 Medication Instructions:  Remain off Spironolactone  at this time.   Resume Aspirin  81mg  daily.   *If you need a refill on your cardiac medications before your next appointment, please call your pharmacy*  Lab Work: Repeat labs with Dr. Nichole as scheduled.  Your physician recommends that you return for lab work one week prior to your 3-4 month follow up for fasting lipid panel  Follow-Up: At [facility], you and your health needs are our priority.  As part of our continuing mission to provide you with exceptional heart care, our providers are all part of one team.  This team includes your primary Cardiologist (physician) and Advanced Practice Providers or APPs (Physician Assistants and Nurse Practitioners) who all work together to provide you with the care you need, when you need it.  Your next appointment:   3-4 month(s)  Provider:   Annabella Scarce, MD, Rosaline Bane, NP, or Reche Finder, NP    We recommend signing up for the patient portal called MyChart.  Sign up information is provided on this After Visit Summary.  MyChart is used to connect with patients for Virtual Visits (Telemedicine).  Patients are able to view lab/test results, encounter notes, upcoming appointments, etc.  Non-urgent messages can be sent to your provider as well.   To learn more about what you can do with MyChart, go to [email or web address].   Other Instructions

## 2024-03-14 NOTE — Progress Notes (Signed)
 " Cardiology Office Note   Date:  03/14/2024  ID:  Wendy [name], DOB [birth year], MRN [id number] PCP: Nichole Senior, MD  [facility] Providers Cardiologist:  Annabella Scarce, MD     History of Present Illness Wendy [name] is a 79 y.o. female with history of coronary calcification, hypertension (onset menopause), hyperlipidemia, carotid stenosis.  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   Family history hypertension heart failure with her mother having high blood pressure and dying from heart failure.   Calcium  score 11/2021 of 103 placing her at the 58th percentile.   Previously on spironolactone  which was held due to hyponatremia.  Did not tolerate higher doses of statin due to myalgia.  Inclisiran was initiated.  She was seen 12/30/2023.  Valsartan  80 mg twice daily, spironolactone  25 mg daily, Toprol  25 mg daily continued.  She has hydralazine  10 mg as needed for elevated BP.  Plan was to reassess BP control in 2 months and if not at goal to change metoprolol  to a more potent beta-blocker.  Given hyponatremia she was recommended to avoid thiazide diuretic.  Due to carotid duplex 01/17/2024 mild bilateral 1 to 39% stenosis, trivial inflow in right subclavian artery.  Since last seen on 03/04/24 her Leqvio  treatment plan was discontinued due to experiencing back pain with doses.  Presents today for follow up. Recent bout of diverticulus. Her blood pressure has been reasonably controlled at home.   Her primary care stopped her Spironolactone  due to Na 128 per her report. This was in setting of eating poorly with recent diverticulitis. Her normal sodium level is in the 130s. She will take Valsartan  half tablet PRN if SBP >150, has only had to do this a couple of times. She has not taken Spironolactone  in one week. She is not taking aspirin  regularly, but agreeable to resume. Reports she stopped Leqvio  injection after 3  injections as she did not feel well after the injections and had back pain. She reports also some baseline back issues. She reports I can't take statin.  Has repeat labs with Dr. Nichole next week to reasses sodium level.   She had lipid panel 01/16/24 TC 142, trig 208, HDL 43, LDL 58.   ROS: Please see the history of present illness.    All other systems reviewed and are negative.   Studies Reviewed      Cardiac Studies & Procedures   ______________________________________________________________________________________________        [name]  LONG TERM MONITOR (3-14 DAYS) 02/21/2022  Narrative   Predominate rhythm is sinus rhythm.   1 run of nonsustained SVT  - last 8 beats with avg. H Rof 168   Rare PACs, rare PVCs   No significant arrhythmias observed   Patch Wear Time:  6 days and 18 hours (2023-12-09T13:54:03-498 to 2023-12-16T08:33:26-0500)  Patient had a min HR of 47 bpm, max HR of 174 bpm, and avg HR of 63 bpm. Predominant underlying rhythm was Sinus Rhythm. 1 run of Supraventricular Tachycardia occurred lasting 8 beats with a max rate of 174 bpm (avg 168 bpm). Isolated SVEs were rare (<1.0%), SVE Couplets were rare (<1.0%), and SVE Triplets were rare (<1.0%). Isolated VEs were rare (<1.0%), and no VE Couplets or VE Triplets were present.       ______________________________________________________________________________________________      Risk Assessment/Calculations           Physical Exam VS:  BP 130/68 (BP Location: Right Arm,  Patient Position: Sitting, Cuff Size: Normal)   Pulse 61   Ht 5' (1.524 m)   Wt 147 lb 14.4 oz (67.1 kg)   SpO2 98%   BMI 28.88 kg/m        Wt Readings from Last 3 Encounters:  03/14/24 147 lb 14.4 oz (67.1 kg)  12/31/23 149 lb (67.6 kg)  10/23/22 146 lb (66.2 kg)    GEN: Well nourished, well developed in no acute distress NECK: No JVD; No carotid bruits CARDIAC: RRR, no murmurs, rubs, gallops RESPIRATORY:  Clear  to auscultation without rales, wheezing or rhonchi  ABDOMEN: Soft, non-tender, non-distended EXTREMITIES:  No edema; No deformity   ASSESSMENT AND PLAN  Hypertension-controlled in clinic the spironolactone  was discontinued by her primary care provider a week ago due to worsening of her sodium level in the 120s, labs not available for my review. Has repeat labs next week with PCP for monitoring of sodium level.  Discussed to monitor BP at home at least 2 hours after medications and sitting for 5-10 minutes.  MyChart in 1 week to check on repeat labs from PCP as well as BP readings from home.  If BP is at goal we will continue present regimen.  If BP not at goal less than 130/80 and sodium improved to baseline could consider resuming spironolactone .  If BP not at goal less than 130/80 and sodium persistently low plan to transition Toprol  to carvedilol. Refill hydralazine  10mg  PRN for SBP >150 provided today. Continue Toprol  25 mg daily, valsartan  80 mg twice daily.  Hyponatremia- repeat labs next week with PCP. Spironolactone  on hold, as above.  Recommend restrict to <64 oz fluid intake per day.  Avoid thiazide diuretic  Bilateral carotid artery stenosis-01/17/2024 duplex bilateral 1-39% carotid stenosis with right subclavian artery with turbulent flow.  Lipid management, as below.  HLD, LDL less than 70- Completed two Leqvio  injections 04/2022 and 08/2022. Reported back pain, feeling poorly thereafter and discontinued. Presently on red yeast rice and zetia 10mg  daily. She had lipid panel 01/16/24 TC 142, trig 208, HDL 43, LDL 58.  Repeat lipid panel in 3 months with Lp(a). If LDL not at goal or Lp(a) elevated, consider bempedoic acid and/or referral to Dr. Mona in Lipid clinic.   CAD/aortic atherosclerosis-11/2021 calcium  score 103 placing her in the 50th percentile.Stable with no anginal symptoms. No indication for ischemic evaluation.  She is agreeable to resume aspirin  80 mg daily.  Continue  Toprol  25 mg daily.  Lipid management, as above. Recommend aiming for 150 minutes of moderate intensity activity per week and following a heart healthy diet.         Dispo: follow up in 3-4 months with Dr. Raford or APP  Signed, Reche GORMAN Finder, NP   "

## 2024-06-19 ENCOUNTER — Ambulatory Visit (HOSPITAL_BASED_OUTPATIENT_CLINIC_OR_DEPARTMENT_OTHER): Admitting: Cardiovascular Disease
